# Patient Record
Sex: Female | Born: 1950
Health system: Southern US, Community
[De-identification: ages and names within clinical notes are randomized; demographics above are authoritative.]

## PROBLEM LIST (undated history)

## (undated) DIAGNOSIS — Z780 Asymptomatic menopausal state: Secondary | ICD-10-CM

## (undated) DIAGNOSIS — E785 Hyperlipidemia, unspecified: Secondary | ICD-10-CM

## (undated) DIAGNOSIS — J45909 Unspecified asthma, uncomplicated: Secondary | ICD-10-CM

## (undated) DIAGNOSIS — I10 Essential (primary) hypertension: Secondary | ICD-10-CM

## (undated) DIAGNOSIS — C50919 Malignant neoplasm of unspecified site of unspecified female breast: Secondary | ICD-10-CM

## (undated) DIAGNOSIS — G43109 Migraine with aura, not intractable, without status migrainosus: Secondary | ICD-10-CM

## (undated) DIAGNOSIS — K562 Volvulus: Secondary | ICD-10-CM

## (undated) DIAGNOSIS — A609 Anogenital herpesviral infection, unspecified: Secondary | ICD-10-CM

## (undated) HISTORY — DX: Malignant neoplasm of unspecified site of unspecified female breast: C50.919

## (undated) HISTORY — DX: Asymptomatic menopausal state: Z78.0

## (undated) HISTORY — DX: Hyperlipidemia, unspecified: E78.5

## (undated) HISTORY — DX: Anogenital herpesviral infection, unspecified: A60.9

## (undated) HISTORY — DX: Volvulus: K56.2

## (undated) HISTORY — DX: Essential (primary) hypertension: I10

## (undated) HISTORY — DX: Unspecified asthma, uncomplicated: J45.909

## (undated) HISTORY — PX: OVARIAN CYST REMOVAL: SHX89

## (undated) HISTORY — PX: OTHER SURGICAL HISTORY: SHX169

## (undated) HISTORY — PX: ESOPHAGEAL DILATION: SHX303

## (undated) HISTORY — PX: HERNIA REPAIR: SHX51

## (undated) HISTORY — DX: Migraine with aura, not intractable, without status migrainosus: G43.109

## (undated) HISTORY — PX: TYMPANOSTOMY TUBE PLACEMENT: SHX32

---

## 2001-05-19 HISTORY — PX: COLON SURGERY: SHX602

## 2005-02-17 ENCOUNTER — Encounter: Payer: Self-pay | Admitting: Family Medicine

## 2005-05-21 ENCOUNTER — Encounter: Payer: Self-pay | Admitting: Family Medicine

## 2005-05-22 ENCOUNTER — Ambulatory Visit: Payer: Self-pay | Admitting: Family Medicine

## 2005-06-09 ENCOUNTER — Ambulatory Visit: Payer: Self-pay | Admitting: Family Medicine

## 2005-07-04 ENCOUNTER — Ambulatory Visit: Payer: Self-pay | Admitting: Family Medicine

## 2005-08-19 ENCOUNTER — Ambulatory Visit: Payer: Self-pay | Admitting: Family Medicine

## 2005-12-23 ENCOUNTER — Ambulatory Visit: Payer: Self-pay | Admitting: Family Medicine

## 2005-12-26 ENCOUNTER — Ambulatory Visit: Payer: Self-pay | Admitting: Family Medicine

## 2006-02-24 DIAGNOSIS — J4540 Moderate persistent asthma, uncomplicated: Secondary | ICD-10-CM | POA: Insufficient documentation

## 2006-02-24 DIAGNOSIS — N951 Menopausal and female climacteric states: Secondary | ICD-10-CM

## 2006-02-24 DIAGNOSIS — G43909 Migraine, unspecified, not intractable, without status migrainosus: Secondary | ICD-10-CM | POA: Insufficient documentation

## 2006-02-24 DIAGNOSIS — F339 Major depressive disorder, recurrent, unspecified: Secondary | ICD-10-CM

## 2006-02-26 ENCOUNTER — Observation Stay (HOSPITAL_COMMUNITY): Admission: EM | Admit: 2006-02-26 | Discharge: 2006-02-27 | Payer: Self-pay | Admitting: Emergency Medicine

## 2006-02-26 ENCOUNTER — Ambulatory Visit: Payer: Self-pay | Admitting: Family Medicine

## 2006-02-26 ENCOUNTER — Ambulatory Visit: Payer: Self-pay | Admitting: Internal Medicine

## 2006-03-10 ENCOUNTER — Ambulatory Visit: Payer: Self-pay | Admitting: Cardiology

## 2006-03-10 ENCOUNTER — Ambulatory Visit: Payer: Self-pay

## 2006-03-10 ENCOUNTER — Encounter: Payer: Self-pay | Admitting: Cardiology

## 2006-03-18 ENCOUNTER — Ambulatory Visit: Payer: Self-pay | Admitting: Family Medicine

## 2006-03-25 ENCOUNTER — Ambulatory Visit: Payer: Self-pay | Admitting: Cardiology

## 2006-04-21 ENCOUNTER — Encounter: Payer: Self-pay | Admitting: Family Medicine

## 2006-06-16 ENCOUNTER — Telehealth: Payer: Self-pay | Admitting: Family Medicine

## 2006-06-16 DIAGNOSIS — B009 Herpesviral infection, unspecified: Secondary | ICD-10-CM | POA: Insufficient documentation

## 2006-08-13 ENCOUNTER — Encounter: Payer: Self-pay | Admitting: Family Medicine

## 2006-10-20 ENCOUNTER — Ambulatory Visit: Payer: Self-pay | Admitting: Family Medicine

## 2006-10-20 ENCOUNTER — Encounter: Payer: Self-pay | Admitting: Family Medicine

## 2006-10-20 ENCOUNTER — Other Ambulatory Visit: Admission: RE | Admit: 2006-10-20 | Discharge: 2006-10-20 | Payer: Self-pay | Admitting: Family Medicine

## 2006-10-20 DIAGNOSIS — E785 Hyperlipidemia, unspecified: Secondary | ICD-10-CM

## 2006-10-22 ENCOUNTER — Encounter: Payer: Self-pay | Admitting: Family Medicine

## 2006-10-22 LAB — CONVERTED CEMR LAB
ALT: 17 U/L
AST: 22 U/L
Albumin: 4.2 g/dL
Alkaline Phosphatase: 93 U/L
BUN: 12 mg/dL
CO2: 27 meq/L
Calcium: 9.2 mg/dL
Chloride: 101 meq/L
Cholesterol: 252 mg/dL — ABNORMAL HIGH
Creatinine, Ser: 0.74 mg/dL
Glucose, Bld: 83 mg/dL
HDL: 107 mg/dL
LDL Cholesterol: 134 mg/dL — ABNORMAL HIGH
Potassium: 4.9 meq/L
Sodium: 139 meq/L
TSH: 2.19 u[IU]/mL
Total Bilirubin: 0.4 mg/dL
Total CHOL/HDL Ratio: 2.4
Total Protein: 6.3 g/dL
Triglycerides: 56 mg/dL
VLDL: 11 mg/dL

## 2006-10-26 ENCOUNTER — Encounter: Payer: Self-pay | Admitting: Family Medicine

## 2006-10-26 ENCOUNTER — Telehealth (INDEPENDENT_AMBULATORY_CARE_PROVIDER_SITE_OTHER): Payer: Self-pay | Admitting: *Deleted

## 2006-12-01 ENCOUNTER — Encounter: Payer: Self-pay | Admitting: Family Medicine

## 2006-12-02 ENCOUNTER — Encounter: Payer: Self-pay | Admitting: Family Medicine

## 2006-12-02 DIAGNOSIS — M949 Disorder of cartilage, unspecified: Secondary | ICD-10-CM

## 2006-12-02 DIAGNOSIS — M899 Disorder of bone, unspecified: Secondary | ICD-10-CM

## 2006-12-03 ENCOUNTER — Encounter: Payer: Self-pay | Admitting: Family Medicine

## 2007-03-22 ENCOUNTER — Ambulatory Visit: Payer: Self-pay | Admitting: Family Medicine

## 2007-04-09 ENCOUNTER — Ambulatory Visit: Payer: Self-pay | Admitting: Family Medicine

## 2007-05-19 ENCOUNTER — Ambulatory Visit: Payer: Self-pay | Admitting: Family Medicine

## 2007-07-14 ENCOUNTER — Ambulatory Visit: Payer: Self-pay | Admitting: Family Medicine

## 2007-12-06 ENCOUNTER — Ambulatory Visit: Payer: Self-pay | Admitting: Family Medicine

## 2007-12-06 ENCOUNTER — Encounter: Payer: Self-pay | Admitting: Family Medicine

## 2007-12-06 ENCOUNTER — Encounter: Admission: RE | Admit: 2007-12-06 | Discharge: 2007-12-06 | Payer: Self-pay | Admitting: Family Medicine

## 2007-12-06 ENCOUNTER — Other Ambulatory Visit: Admission: RE | Admit: 2007-12-06 | Discharge: 2007-12-06 | Payer: Self-pay | Admitting: Family Medicine

## 2007-12-06 LAB — CONVERTED CEMR LAB
AST: 21 units/L (ref 0–37)
Alkaline Phosphatase: 92 units/L (ref 39–117)
BUN: 13 mg/dL (ref 6–23)
Calcium: 8.9 mg/dL (ref 8.4–10.5)
Chloride: 98 meq/L (ref 96–112)
Creatinine, Ser: 0.69 mg/dL (ref 0.40–1.20)
HDL: 108 mg/dL (ref >39)
Total Bilirubin: 0.4 mg/dL (ref 0.3–1.2)
Total CHOL/HDL Ratio: 2.3
VLDL: 11 mg/dL (ref 0–40)

## 2007-12-07 ENCOUNTER — Encounter: Payer: Self-pay | Admitting: Family Medicine

## 2008-02-17 ENCOUNTER — Ambulatory Visit: Payer: Self-pay | Admitting: Family Medicine

## 2008-03-16 ENCOUNTER — Ambulatory Visit: Payer: Self-pay | Admitting: Family Medicine

## 2008-03-20 ENCOUNTER — Telehealth: Payer: Self-pay | Admitting: Family Medicine

## 2008-03-20 ENCOUNTER — Encounter: Admission: RE | Admit: 2008-03-20 | Discharge: 2008-03-20 | Payer: Self-pay | Admitting: Family Medicine

## 2008-03-20 ENCOUNTER — Encounter: Payer: Self-pay | Admitting: Family Medicine

## 2008-03-22 ENCOUNTER — Telehealth: Payer: Self-pay | Admitting: Family Medicine

## 2008-09-01 ENCOUNTER — Telehealth: Payer: Self-pay | Admitting: Family Medicine

## 2008-12-19 ENCOUNTER — Telehealth (INDEPENDENT_AMBULATORY_CARE_PROVIDER_SITE_OTHER): Payer: Self-pay | Admitting: *Deleted

## 2009-01-10 ENCOUNTER — Telehealth (INDEPENDENT_AMBULATORY_CARE_PROVIDER_SITE_OTHER): Payer: Self-pay | Admitting: *Deleted

## 2009-02-19 ENCOUNTER — Ambulatory Visit: Payer: Self-pay | Admitting: Family Medicine

## 2009-02-21 ENCOUNTER — Telehealth (INDEPENDENT_AMBULATORY_CARE_PROVIDER_SITE_OTHER): Payer: Self-pay | Admitting: *Deleted

## 2009-03-19 ENCOUNTER — Telehealth (INDEPENDENT_AMBULATORY_CARE_PROVIDER_SITE_OTHER): Payer: Self-pay | Admitting: *Deleted

## 2009-07-06 ENCOUNTER — Telehealth (INDEPENDENT_AMBULATORY_CARE_PROVIDER_SITE_OTHER): Payer: Self-pay | Admitting: *Deleted

## 2009-07-25 ENCOUNTER — Encounter: Payer: Self-pay | Admitting: Family Medicine

## 2009-08-09 ENCOUNTER — Telehealth: Payer: Self-pay | Admitting: Family Medicine

## 2009-08-29 ENCOUNTER — Other Ambulatory Visit: Admission: RE | Admit: 2009-08-29 | Discharge: 2009-08-29 | Payer: Self-pay | Admitting: Family Medicine

## 2009-08-29 ENCOUNTER — Ambulatory Visit: Payer: Self-pay | Admitting: Family Medicine

## 2009-08-30 LAB — CONVERTED CEMR LAB
Alkaline Phosphatase: 104 units/L (ref 39–117)
Glucose, Bld: 88 mg/dL (ref 70–99)
HDL: 116 mg/dL (ref >39)
LDL Cholesterol: 131 mg/dL — ABNORMAL HIGH (ref 0–99)
Sodium: 136 meq/L (ref 135–145)
Total Bilirubin: 0.5 mg/dL (ref 0.3–1.2)
Total Protein: 6.3 g/dL (ref 6.0–8.3)
Triglycerides: 55 mg/dL (ref <150)
VLDL: 11 mg/dL (ref 0–40)

## 2009-10-04 ENCOUNTER — Encounter: Payer: Self-pay | Admitting: Family Medicine

## 2010-02-05 ENCOUNTER — Telehealth: Payer: Self-pay | Admitting: Family Medicine

## 2010-02-16 HISTORY — PX: SMALL INTESTINE SURGERY: SHX150

## 2010-03-06 ENCOUNTER — Ambulatory Visit: Payer: Self-pay | Admitting: Family Medicine

## 2010-04-05 ENCOUNTER — Ambulatory Visit: Payer: Self-pay | Admitting: Family Medicine

## 2010-04-05 DIAGNOSIS — J019 Acute sinusitis, unspecified: Secondary | ICD-10-CM

## 2010-04-08 ENCOUNTER — Telehealth: Payer: Self-pay | Admitting: Family Medicine

## 2010-04-17 ENCOUNTER — Telehealth (INDEPENDENT_AMBULATORY_CARE_PROVIDER_SITE_OTHER): Payer: Self-pay | Admitting: *Deleted

## 2010-06-09 ENCOUNTER — Encounter: Payer: Self-pay | Admitting: Family Medicine

## 2010-06-18 NOTE — Progress Notes (Signed)
Summary: Asthma  Phone Note Call from [name]   Caller: [name] Summary of Call: Pt states she saw Dr. Judie Petit 2 weeks ago and is feeling better but is still having problems w/ asthma. Pt would like to know what you recommend. Initial call taken by: Payton Spark CMA,  April 17, 2010 1:11 PM  Follow-up for Phone Call        Is she still using her ADVAIR 2 x day, Singulair each night and her RESCUE inhaler as needed? Follow-up by: Seymour Bars DO,  April 17, 2010 1:13 PM  Additional Follow-up for Phone Call Additional follow up Details #1::        [facility] informing Pt of the above Additional Follow-up by: Payton Spark CMA,  April 17, 2010 1:26 PM

## 2010-06-18 NOTE — Assessment & Plan Note (Signed)
Summary: Flu shot - jr  Nurse Visit   Allergies: 1)  ! Codeine  Immunizations Administered:  Influenza Vaccine # 1:    Vaccine Type: Fluvax 3+    Site: left deltoid    Mfr: GlaxoSmithKline    Dose: 0.5 ml    Route: IM    Given by: Sue Lush McCrimmon CMA, (AAMA)    Exp. Date: 11/16/2010    Lot #: [id number]    VIS given: 12/11/09 version given March 06, 2010.  Flu Vaccine Consent Questions:    Do you have a history of severe allergic reactions to this vaccine? no    Any prior history of allergic reactions to egg and/or gelatin? no    Do you have a sensitivity to the preservative Thimersol? no    Do you have a past history of Guillan-Barre Syndrome? no    Do you currently have an acute febrile illness? no    Have you ever had a severe reaction to latex? no    Vaccine information given and explained to patient? no    Are you currently pregnant? no  Orders Added: 1)  Flu Vaccine 52yrs + [90658] 2)  Admin 1st Vaccine [09811]

## 2010-06-18 NOTE — Progress Notes (Signed)
Summary: Requests to restart Advair  Phone Note Call from Patient   Caller: Patient [phone]  [phone] Summary of Call: Pt states she would like to go back on her Advair bc Qvar is not working as well and she is having a hard time w/ her asthma. Please advise. Initial call taken by: Payton Spark CMA,  February 05, 2010 11:56 AM  Follow-up for Phone Call        Changed back to Advair.  Schedule OV in the next wk for f/u asthma and to get a flu shot. Follow-up by: Seymour Bars DO,  February 05, 2010 12:40 PM    New/Updated Medications: ADVAIR DISKUS 250-50 MCG/DOSE AEPB (FLUTICASONE-SALMETEROL) 1 puff BID Prescriptions: ADVAIR DISKUS 250-50 MCG/DOSE AEPB (FLUTICASONE-SALMETEROL) 1 puff BID  #1 diskus x 3   Entered and Authorized by:   Seymour Bars DO   Signed by:   Seymour Bars DO on 02/05/2010   Method used:   [facility] [phone][facility] (retail[facility]       [address]       [place], [place]  [address]       Ph: [phone] or [phone]       Fax: [phone]   RxID:   [id number]   Appended Document: Requests to restart Advair Pt aware of the above

## 2010-06-18 NOTE — Miscellaneous (Signed)
Summary: mammogram normal  Clinical Lists Changes  Observations: Added new observation of MAMMRECACT: Screening mammogram in 1 year.    (10/03/2009 13:06) Added new observation of MAMMOGRAM: [facility]   No significant changes compared to previous study.  Assessment: BIRADS 1.  (10/03/2009 13:06)      Mammogram  Procedure date:  10/03/2009  Findings:      [facility]: [facility] ([facility]   No significant changes compared to previous study.  Assessment: BIRADS 1.   Comments:      Screening mammogram in 1 year.      Mammogram  Procedure date:  10/03/2009  Findings:      Location: [facility] ([facility]   No significant changes compared to previous study.  Assessment: BIRADS 1.   Comments:      Screening mammogram in 1 year.     Appended Document: mammogram normal   Bone Density  Procedure date:  10/03/2009  Findings:      Breast Clinic.  T socre L spine -1.1 Femoral neck -0.9  Osteopenia.  Unchanged.  Comments:      Assessment:  Osteopenia.      Bone Density  Procedure date:  10/03/2009  Findings:      Breast Clinic.  T socre L spine -1.1 Femoral neck -0.9  Osteopenia.  Unchanged.  Comments:      Assessment:  Osteopenia.      Appended Document: mammogram normal Pls let pt know that her mammogram was NORMAL and her bone density is unchanged with osteopenia.  She is to stay on Calcium with D 1-2 x a day with meals.  Repeat DEXA in 2 yrs, mammogramin 56yr.  Seymour Bars, D.O.  Appended Document: mammogram normal Pt notified of results and MD instructions. KJ LPN

## 2010-06-18 NOTE — Progress Notes (Signed)
Summary: Congestion  Phone Note Call from Patient   Summary of Call: Good morning [name], pt called with complaint of  congestion, wanted appt for today, pls call her to advise [phone]. Thanks, Diane  Follow-up for Phone Call        [facility] for Pt to CB w/ more info.  Follow-up by: Payton Spark CMA,  July 06, 2009 9:34 AM  Additional Follow-up for Phone Call Additional follow up Details #1::        [facility] and offered Pt apt this AM since we had cancellations. Pt did not call back. Closing note until further notice  Additional Follow-up by: Payton Spark CMA,  July 06, 2009 11:41 AM

## 2010-06-18 NOTE — Assessment & Plan Note (Signed)
Summary: Sinusitis, Asthma   Vital Signs:  Patient profile:   60 year old female Height:      62 inches Weight:      139 pounds O2 Sat:      97 % on Room air Temp:     98.6 degrees F oral Pulse rate:   82 / minute BP sitting:   130 / 79  (left arm) Cuff size:   regular  Vitals Entered By: Kathlene November LPN (April 05, 2010 11:03 AM)  O2 Flow:  Room air  Serial Vital Signs/Assessments:                                PEF    PreRx  PostRx Time      O2 Sat  O2 Type     L/min  L/min  L/min   By 11:18 AM                      240    260    230     Kim Johnson LPN 40:10 AM                      250    280    240     Kathlene November LPN  Comments: 27:25 AM Pt in yellow zone By: Kathlene November LPN  36:64 AM Post neb tx- yellow zone By: Kathlene November LPN   CC: since last Friday head congestion, throat sore, sinus pressure, dry cough, hoarsness   Primary Care Provider:  Seymour Bars D.O.  CC:  since last Friday head congestion, throat sore, sinus pressure, dry cough, and hoarsness.  History of Present Illness: since last Friday head congestion, throat sore, sinus pressure, dry cough, hoarsness. Sneezing initally but that has resolved. Feels she is now worse.  No fever. TAking Multi-sxs cold robitussin.  No alleviating or exacerbating factors. Hx of asthma.  Sllight cough, but dry, Feels a little tight.  On Adviar but has not had to use her rescue inhaler.  No GI sxs.  Was on BActrim  in October for a UTI.    Current Medications (verified): 1)  Advair Diskus 250-50 Mcg/dose Aepb (Fluticasone-Salmeterol) .Marland Kitchen.. 1 Puff Bid 2)  Ventolin Hfa 108 (90 Base) Mcg/act Aers (Albuterol Sulfate) .... 2 Puffs Q 6 Hrs Prn 3)  Citalopram Hydrobromide 20 Mg Tabs (Citalopram Hydrobromide) .... Take 1 Tablet By Mouth Once A Day 4)  Diazepam 5 Mg Tabs (Diazepam) .... Prn 5)  Imitrex 100 Mg Tabs (Sumatriptan Succinate) .... As Needed Migraine 6)  Promethazine Hcl 25 Mg Tabs (Promethazine Hcl) .... One By Mouth  As Needed 7)  Singulair 10 Mg Tabs (Montelukast Sodium) .... Take 1 Tablet By Mouth Once A Day 8)  Triamterene-Hctz 75-50 Mg Tabs (Triamterene-Hctz) .... One By Mouth Twice Dai Ly 9)  Valtrex 1 Gm Tabs (Valacyclovir Hcl) .... 1/2 Tab By Mouth Daily 10)  Zovirax 5 % Oint (Acyclovir) .... Apply To Herpes Lesions 6 X A Day X 7 Days 11)  Nystatin-Triamcinolone 100000-0.1 Unit/gm-% Oint (Nystatin-Triamcinolone) .... Apply To Perineal Rash Two Times A Day  X 14 Days  Allergies (verified): 1)  ! Codeine  Comments:  Nurse/Medical Assistant: The patient's medications and allergies were reviewed with the patient and were updated in the Medication and Allergy Lists. Kathlene November LPN (April 05, 2010 11:04 AM)  Past History:  Past Medical History:  Last updated: 08/29/2009 fish oil capsules--gi upset  hx volvulus  optical migraines w/ nystagmus  Severe Meniere`s Disease  uses Minette device HSV postmenopausal since age 89  Past Surgical History: Last updated: 03/04/2006 colon resection- small bowel ischemia  hiatal hernia repair  LTCS  ovarian cyst removed  Touma T tube placement Right  Family History: Reviewed history from 08/29/2009 and no changes required. 2 sisters both w/ depression,  father died of Parkinsons Dz, HTN, depression, glaucoma mother alive, HTN  Social History: Reviewed history from 12/06/2007 and no changes required. Works in [employer].  Married to [place].  Has 81 yo son in college.  Nonsmoker.  3 glasses of wine per wk.  1 soda daily.  Wants to resume regular exercise.  Physical Exam  General:  Well-developed,well-nourished,in no acute distress; alert,appropriate and cooperative throughout examination Head:  Normocephalic and atraumatic without obvious abnormalities. No apparent alopecia or balding. Mild facial tenderness under each eye.  Eyes:  No corneal or conjunctival inflammation noted. EOMI. Perrla. Ears:  External ear exam shows no significant  lesions or deformities.  Otoscopic examination reveals clear canals, tympanic membranes are intact bilaterally without bulging, retraction, inflammation or discharge. Hearing is grossly normal bilaterally. Nose:  External nasal examination shows no deformity or inflammation. Mouth:  Oral mucosa and oropharynx without lesions or exudates.  Teeth in good repair. Neck:  No deformities, masses, or tenderness noted. Lungs:  normal respiratory effort.  Diffuse expiratory wheezes on post and anterior lung feilds. Post neb some improvement but still wheezing.  Heart:  Normal rate and regular rhythm. S1 and S2 normal without gallop, murmur, click, rub or other extra sounds. Skin:  no rashes.   Cervical Nodes:  No lymphadenopathy noted Psych:  Cognition and judgment appear intact. Alert and cooperative with normal attention span and concentration. No apparent delusions, illusions, hallucinations   Impression & Recommendations:  Problem # 1:  SINUSITIS - ACUTE-NOS (ICD-461.9)  Her updated medication list for this problem includes:    Amoxicillin 875 Mg Tabs (Amoxicillin) .Marland Kitchen... Take 1 tablet by mouth two times a day for 10 days  Instructed on treatment. Call if symptoms persist or worsen.   Problem # 2:  ASTHMA, UNSPECIFIED (ICD-493.90) Clearly her asthma is flaring. Discussed starting to use her rescue inhaler. Gave her some instructions. Also inc her Advair to two times a day (she had been usning it once daily to wean herself).  I let her know that instaed of once a day when she is ready to wean it is better to drop to the lower dose first or switch to just an inhaled corticosteroid in a step down fashion. I also gave her a peak flow meter adn set the scale for her zones for her. I also reviewed what she she do when she is in each zone.  F/U in 1-2 month with Dr. Leonard Schwartz. Call if she is getting wrose.  Her updated medication list for this problem includes:    Advair Diskus 250-50 Mcg/dose Aepb  (Fluticasone-salmeterol) .Marland Kitchen... 1 puff bid    Ventolin Hfa 108 (90 Base) Mcg/act Aers (Albuterol sulfate) .Marland Kitchen... 2 puffs q 6 hrs prn    Singulair 10 Mg Tabs (Montelukast sodium) .Marland Kitchen... Take 1 tablet by mouth once a day  Orders: Albuterol Sulfate Sol 1mg  unit dose (J1914) Nebulizer Tx (78295) Peak Flow Rate (94150) Peak Flow Meter (A2130)  Complete Medication List: 1)  Advair Diskus 250-50 Mcg/dose Aepb (Fluticasone-salmeterol) .Marland Kitchen.. 1 puff bid 2)  Ventolin Hfa 108 (90  Base) Mcg/act Aers (Albuterol sulfate) .... 2 puffs q 6 hrs prn 3)  Citalopram Hydrobromide 20 Mg Tabs (Citalopram hydrobromide) .... Take 1 tablet by mouth once a day 4)  Diazepam 5 Mg Tabs (Diazepam) .... Prn 5)  Imitrex 100 Mg Tabs (Sumatriptan succinate) .... As needed migraine 6)  Promethazine Hcl 25 Mg Tabs (Promethazine hcl) .... One by mouth as needed 7)  Singulair 10 Mg Tabs (Montelukast sodium) .... Take 1 tablet by mouth once a day 8)  Triamterene-hctz 75-50 Mg Tabs (Triamterene-hctz) .... One by mouth twice dai ly 9)  Valtrex 1 Gm Tabs (Valacyclovir hcl) .... 1/2 tab by mouth daily 10)  Zovirax 5 % Oint (Acyclovir) .... Apply to herpes lesions 6 x a day x 7 days 11)  Nystatin-triamcinolone 100000-0.1 Unit/gm-% Oint (Nystatin-triamcinolone) .... Apply to perineal rash two times a day  x 14 days 12)  Amoxicillin 875 Mg Tabs (Amoxicillin) .... Take 1 tablet by mouth two times a day for 10 days  Patient Instructions: 1)  Start using your ventolin 4 puffs 4 x a day for a couple of days, then 3 x a day for couple of days, then 2 x a day for a couple of days, then once a day for a couple of days.  2)  If you feel you are getting worse then let us know.   3)  Complete all of your antibiotic.   4)  Increase your Advair to two times a day  5)  Follow up wiht Dr. Cathey Endow for you asthma in 1-2 months.  Prescriptions: AMOXICILLIN 875 MG TABS (AMOXICILLIN) Take 1 tablet by mouth two times a day for 10 days  #20 x 0   Entered and  Authorized by:   Nani Gasser MD   Signed by:   Nani Gasser MD on 04/05/2010   Method used:   [facility]  [address] [phone]* (retail)       [address]       [place], [place]  [address]       Ph: [phone] or [phone]       Fax: [phone]   RxID:   [id number]    Medication Administration  Medication # 1:    Medication: Albuterol Sulfate Sol 1mg  unit dose    Diagnosis: ASTHMA, UNSPECIFIED (ICD-493.90)    Dose: 2.5mg     Route: inhaled    Exp Date: 04/19/2011    Lot #: [id number]    Mfr: Nephron    Patient tolerated medication without complications    Given by: Kathlene November LPN (April 05, 2010 11:19 AM)  Orders Added: 1)  Albuterol Sulfate Sol 1mg  unit dose [J7613] 2)  Nebulizer Tx [94640] 3)  Est. Patient Level IV [34742] 4)  Peak Flow Rate [94150] 5)  Peak Flow Meter [V9563]

## 2010-06-18 NOTE — Letter (Signed)
Summary: [facility]  [facility]   Imported By: Lanelle Bal 08/01/2009 11:22:04  _____________________________________________________________________  External Attachment:    Type:   Image     Comment:   External Document

## 2010-06-18 NOTE — Progress Notes (Signed)
Summary: Antibiotic making her sick  Phone Note Call from Patient Call back at [facility] Phone [phone]   Caller: Patient Call For: [place] Summary of Call: Seen Friday and given Amox 875mg  twice a day. Pt states this is making her really nauseated. Can she get something different sent to her pharmacy. Please advise Initial call taken by: Kathlene November LPN,  April 08, 2010 2:26 PM  Follow-up for Phone Call        [facility] will send over new ABX.  Follow-up by: Nani Gasser MD,  April 08, 2010 2:51 PM  Additional Follow-up for Phone Call Additional follow up Details #1::        left message Additional Follow-up by: Avon Gully CMA, Duncan Dull),  April 08, 2010 3:58 PM    New/Updated Medications: ZITHROMAX Z-PAK 250 MG TABS (AZITHROMYCIN) Take as directed. Prescriptions: ZITHROMAX Z-PAK 250 MG TABS (AZITHROMYCIN) Take as directed.  #1 pack x 0   Entered and Authorized by:   Nani Gasser MD   Signed by:   Nani Gasser MD on 04/08/2010   Method used:   [facility] [phone]* (retail)       [address]       [place], [place]  [address]       Ph: [phone] or [phone]       Fax: [phone]   RxID:   [id number]

## 2010-06-18 NOTE — Assessment & Plan Note (Signed)
Summary: CPE with pap   Vital Signs:  Patient profile:   60 year old female Height:      62 inches Weight:      133 pounds BMI:     24.41 O2 Sat:      98 % on Room air Pulse rate:   73 / minute BP sitting:   124 / 80  (left arm) Cuff size:   regular  Vitals Entered By: Payton Spark CMA (August 29, 2009 8:11 AM)  O2 Flow:  Room air CC: CPE w/ pap   Primary Care Provider:  Seymour Bars D.O.  CC:  CPE w/ pap.  History of Present Illness: 60 yo WF presents for CPE with pap smear.  She is doing well after being admitted to the hosp in March for a partial SBO.  Her bowels are back to normal.  She is due for fasting labs, mammogram and DEXA.  She has a dx of asthma but never had a pneumovax.  Her tetanus is UTD.  Denies fam hx of premature heart dz.  She denies any postmenopausal bleeding.  She is exercising and eating fairly healthy.  She has a chronic perineal rash.    Current Medications (verified): 1)  Advair Diskus 250-50 Mcg/dose Misc (Fluticasone-Salmeterol) .... One Inhalation Twice Daily 2)  Ventolin Hfa 108 (90 Base) Mcg/act Aers (Albuterol Sulfate) .... 2 Puffs Q 6 Hrs Prn 3)  Citalopram Hydrobromide 20 Mg Tabs (Citalopram Hydrobromide) .... Take 1 Tablet By Mouth Once A Day 4)  Diazepam 5 Mg Tabs (Diazepam) .... Prn 5)  Imitrex 100 Mg Tabs (Sumatriptan Succinate) .... As Needed Migraine 6)  Promethazine Hcl 25 Mg Tabs (Promethazine Hcl) .... One By Mouth As Needed 7)  Singulair 10 Mg Tabs (Montelukast Sodium) .... Take 1 Tablet By Mouth Once A Day 8)  Triamterene-Hctz 75-50 Mg Tabs (Triamterene-Hctz) .... One By Mouth Twice Dai Ly 9)  Zofran Odt 8 Mg Tbdp (Ondansetron) .... As Needed Can Use Instead of Promethazibne 10)  Valtrex 1 Gm Tabs (Valacyclovir Hcl) .... 1/2 Tab By Mouth Daily  Allergies (verified): 1)  ! Codeine  Past History:  Past Medical History: fish oil capsules--gi upset  hx volvulus  optical migraines w/ nystagmus  Severe Meniere`s Disease  uses  Minette device HSV postmenopausal since age 31  Past Surgical History: Reviewed history from 03/04/2006 and no changes required. colon resection- small bowel ischemia  hiatal hernia repair  LTCS  ovarian cyst removed  Touma T tube placement Right  Family History: Reviewed history from 03/04/2006 and no changes required. 2 sisters both w/ depression,  father died of Parkinsons Dz, HTN, depression, glaucoma mother alive, HTN  Social History: Reviewed history from 12/06/2007 and no changes required. Works in [employer].  Married to [place].  Has 42 yo son in college.  Nonsmoker.  3 glasses of wine per wk.  1 soda daily.  Wants to resume regular exercise.  Review of Systems  The patient denies anorexia, fever, weight loss, weight gain, vision loss, decreased hearing, hoarseness, chest pain, syncope, dyspnea on exertion, peripheral edema, prolonged cough, headaches, hemoptysis, abdominal pain, melena, hematochezia, severe indigestion/heartburn, hematuria, incontinence, genital sores, muscle weakness, suspicious skin lesions, transient blindness, difficulty walking, depression, unusual weight change, abnormal bleeding, enlarged lymph nodes, angioedema, breast masses, and testicular masses.    Physical Exam  General:  alert, well-developed, well-nourished, and well-hydrated.   Head:  normocephalic and atraumatic.   Eyes:  pupils equal, pupils round, and pupils reactive to light.  Ears:  no external deformities.   Nose:  no nasal discharge.   Mouth:  good dentition and pharynx pink and moist.   Neck:  no masses.   Breasts:  No mass, nodules, thickening, tenderness, bulging, retraction, inflamation, nipple discharge or skin changes noted.   Lungs:  Normal respiratory effort, chest expands symmetrically. Lungs are clear to auscultation, no crackles or wheezes. Heart:  Normal rate and regular rhythm. S1 and S2 normal without gallop, murmur, click, rub or other extra sounds. Abdomen:  Bowel  sounds positive,abdomen soft and non-tender without masses, organomegaly or hernias noted. Genitalia:  Pelvic Exam:        External: normal female genitalia without lesions or masses        Vagina: normal without lesions or masses        Cervix: normal without lesions or masses        Adnexa: normal bimanual exam without masses or fullness        Uterus: normal by palpation        Pap smear: performed Pulses:  2+ radial and pedal pulses Extremities:  no E/C/C Skin:  color normal.   Cervical Nodes:  No lymphadenopathy noted Psych:  good eye contact, not anxious appearing, and not depressed appearing.     Impression & Recommendations:  Problem # 1:  Gynecological examination-routine (ICD-V72.31) Keeping healthy checklist for women reviewed. BP at goal.  BMI at goal. Update fasting labs, mammogram and DEXA. PNX today for hx of asthma. Tetanus and colonoscopy are UTD. MVI along with calcium with D recommended. Healthy diet and regular exercise recommended.    Complete Medication List: 1)  Qvar 80 Mcg/act Aers (Beclomethasone dipropionate) .Marland Kitchen.. 1 puff two times a day 2)  Ventolin Hfa 108 (90 Base) Mcg/act Aers (Albuterol sulfate) .... 2 puffs q 6 hrs prn 3)  Citalopram Hydrobromide 20 Mg Tabs (Citalopram hydrobromide) .... Take 1 tablet by mouth once a day 4)  Diazepam 5 Mg Tabs (Diazepam) .... Prn 5)  Imitrex 100 Mg Tabs (Sumatriptan succinate) .... As needed migraine 6)  Promethazine Hcl 25 Mg Tabs (Promethazine hcl) .... One by mouth as needed 7)  Singulair 10 Mg Tabs (Montelukast sodium) .... Take 1 tablet by mouth once a day 8)  Triamterene-hctz 75-50 Mg Tabs (Triamterene-hctz) .... One by mouth twice dai ly 9)  Zofran Odt 8 Mg Tbdp (Ondansetron) .... As needed can use instead of promethazibne 10)  Valtrex 1 Gm Tabs (Valacyclovir hcl) .... 1/2 tab by mouth daily 11)  Zovirax 5 % Oint (Acyclovir) .... Apply to herpes lesions 6 x a day x 7 days 12)  Nystatin-triamcinolone  100000-0.1 Unit/gm-% Oint (Nystatin-triamcinolone) .... Apply to perineal rash two times a day  x 14 days  Other Orders: T-Comprehensive Metabolic Panel [id number]) T-Lipid Profile [id number]) T-Mammography Bilateral Screening (29528) T-DXA Bone Density/ Appendicular (41324) T-Dual DXA Bone Density/ Axial (40102) Pneumococcal Vaccine (72536) Admin 1st Vaccine (64403) Admin 1st Vaccine [facility]) [phone])  Patient Instructions: 1)  Use Zovirax ointment for herpes outbreak and Nystatin/ Triamcinolone for perineal rash.  After 14 days, switch to Aquaphor 2 x a day.  Repeat if rash recurs after 1 month. 2)  Change Advair to Qvar 2 x a day for step down therapy for asthma.  Rinse mouth out after each use. 3)  Update fasting labs today. 4)  Will call you with pap and lab results by Friday. 5)  Update mammogram and DEXA. 6)  Pneumovax given today. 7)  Return for f/u asthma  in 3 mos. Prescriptions: NYSTATIN-TRIAMCINOLONE 100000-0.1 UNIT/GM-% OINT (NYSTATIN-TRIAMCINOLONE) apply to perineal rash two times a day  x 14 days  #15g x 1   Entered and Authorized by:   Seymour Bars DO   Signed by:   Seymour Bars DO on 08/29/2009   Method used:   [facility] [phone]* (retail)       [address]       [place], [place]  [address]       Ph: [phone] or [phone]       Fax: [phone]   RxID:   [id number] ZOVIRAX 5 % OINT (ACYCLOVIR) apply to herpes lesions 6 x a day x 7 days  #15g x 1   Entered and Authorized by:   Seymour Bars DO   Signed by:   Seymour Bars DO on 08/29/2009   Method used:   [facility] [phone]* (retail)       [address]       [place], [place]  [address]       Ph: [phone] or [phone]       Fax: [phone]   RxID:   [id number] QVAR 80 MCG/ACT AERS (BECLOMETHASONE DIPROPIONATE) 1 puff two times a day  #3 inhalers x 1   Entered and Authorized by:   Seymour Bars DO   Signed by:   Seymour Bars DO on  08/29/2009   Method used:   Electronically to        [facility]* (mail-order)             ,          Ph: [phone]       Fax: [phone]   RxID:   [id number]    Orders Added: 1)  T-Comprehensive Metabolic Panel [[id number]] 2)  T-Lipid Profile [69678-93810] 3)  T-Mammography Bilateral Screening [77057] 4)  T-DXA Bone Density/ Appendicular [77081] 5)  T-Dual DXA Bone Density/ Axial [77080] 6)  Pneumococcal Vaccine [90732] 7)  Admin 1st Vaccine [90471] 8)  Admin 1st Vaccine [facility]) [17510C] 9)  Est. Patient age 67-64 [27]    Pneumovax Vaccine    Vaccine Type: Pneumovax    Dose: 0.5 ml    Route: IM    Given by: Payton Spark CMA    Exp. Date: 06/19/2010    Lot #: [id number]    VIS given: 12/15/95 version given August 29, 2009.

## 2010-06-18 NOTE — Progress Notes (Signed)
Summary: Rx  Phone Note Call from Patient Call back at Home Phone [phone]   Caller: Patient Call For: Seymour Bars DO Reason for Call: Refill Medication Summary of Call: Has 18-20 pills left- Citalopram-please send to [facility]-pt has scheduled a physical in April Initial call taken by: Lannette Donath,  August 09, 2009 9:38 AM    Prescriptions: CITALOPRAM HYDROBROMIDE 20 MG TABS (CITALOPRAM HYDROBROMIDE) Take 1 tablet by mouth once a day  #90 x 2   Entered by:   Kathlene November   Authorized by:   Nani Gasser MD   Signed by:   Kathlene November on 08/09/2009   Method used:   Electronically to        [facility] (mail-order)             ,          Ph: [phone]       Fax: [phone]   RxID:   [id number]

## 2010-09-04 ENCOUNTER — Other Ambulatory Visit: Payer: Self-pay | Admitting: Family Medicine

## 2010-09-05 ENCOUNTER — Other Ambulatory Visit: Payer: Self-pay | Admitting: Family Medicine

## 2010-09-05 ENCOUNTER — Other Ambulatory Visit: Payer: Self-pay | Admitting: Sports Medicine

## 2010-09-05 ENCOUNTER — Ambulatory Visit
Admission: RE | Admit: 2010-09-05 | Discharge: 2010-09-05 | Disposition: A | Discharge status: Home / Self Care | Payer: Self-pay | Source: Ambulatory Visit | Attending: Sports Medicine | Admitting: Sports Medicine

## 2010-09-05 DIAGNOSIS — M545 Low back pain: Secondary | ICD-10-CM

## 2010-09-10 ENCOUNTER — Ambulatory Visit: Payer: 59 | Admitting: Physical Therapy

## 2010-09-15 ENCOUNTER — Other Ambulatory Visit: Payer: Self-pay | Admitting: Family Medicine

## 2010-10-04 NOTE — H&P (Signed)
[name], Mathews         ACCOUNT NO.:  [id number]   MEDICAL RECORD NO.:  [id number]          PATIENT TYPE:  EMS   LOCATION:  MAJO                         FACILITY:  [facility]   PHYSICIAN:  Bevelyn Buckles. Bensimhon, MDDATE OF BIRTH:  11-09-1950   DATE OF ADMISSION:  02/26/2006  DATE OF DISCHARGE:                                HISTORY & PHYSICAL   PRIMARY CARE PHYSICIAN:  Dr. Nani Mathews.  She is new to [facility]  cardiology.   REASON FOR ADMISSION:  Chest pain and palpitations.   HISTORY OF PRESENT ILLNESS:  [name] is a delightful 60 year old woman  with a history of asthma and depression.  She denies any history of known  cardiac disease.  She has never had a stress test or cardiac  catheterization.  She also has a history of severe Meniere's disease.  Apparently she had one of the tubes fall out of her right ear and she  underwent replacement of this morning.  This afternoon while she was at  work, she developed severe pounding in her chest and it felt irregular and  hard.  Also was associated with shortness of breath and chest pressure  radiating to her left shoulder.  This persisted for some time so she left  and went to see her primary care doctor.  While she was having the symptoms,  although a bit less, they obtained EKG which showed sinus rhythm with Mobitz  I second degree heart block consistent with Wenckebach.  The symptoms have  now abated.  She was told to come to the ER for further evaluation and  observation.   At baseline, she is fairly active though she is limited by her Meniere's  disease.  She walks about half an hour a day without any chest pain or other  discomfort.  She is very careful about her caffeine intake and only drinks  one Pepsi a day.  She has never had syncope or presyncope.   REVIEW OF SYSTEMS:  Negative for fevers, chills, no nausea, vomiting,  abdominal pain.  Denies any melena or bright red blood per rectum.  There is  no heart  failure symptoms or claudication.  It is notable for asthma,  depression and menopausal symptoms.  Remainder review of systems is negative  except for as described in the HPI and problem list.   PROBLEMS:  1. Asthma.  2. Major depression, recurrent.  3. Perimenopausal.  4. Migraine.  5. Severe Meniere's disease.  6. History of volvulus and small bowel obstruction requiring small-bowel      resection several years ago.   CURRENT MEDICATIONS:  1. Advair Diskus.  2. Albuterol p.r.n.  3. Citalopram 20 mg a day.  4. Diazepam 5 mg p.r.n.  5. Flunisolide.  6. Imitrex p.r.n.  7. Promethazine p.r.n.  8. Singulair 10 mg a day.  9. Triamterene/hydrochlorothiazide 70/75/50.  10.Valtrex as a suppressive medication.  11.Verapamil 240 a day for optical migraines.  12.Zofran p.r.n.Marland Kitchen   ALLERGIES:  CODEINE.   SOCIAL HISTORY:  She lives in [place] with her husband.  She sells real  estate.  Denies any tobacco use.  She drinks  three glasses of wine per week.   FAMILY HISTORY:  There is father died of Parkinson's disease, also a history  of hypertension, depression.  Mother is alive with hypertension.  Two  sisters both of depression.  No history of premature coronary artery disease  or sudden cardiac death.   PHYSICAL EXAM:  She is well-appearing in no acute distress.  Respirations  are unlabored.  Temperature is 97.7, blood pressure is 124/63, heart rate  64.  Saturating 100% on room air.  HEENT: Sclerae anicteric.  EOMI.  There are no xanthelasmas.  Mucous  membranes are moist.  NECK:  Supple.  There is no JVD.  Carotid 2+ bilaterally without any bruits.  There is no lymphadenopathy or thyromegaly.  CARDIAC:  She is regular rate and rhythm.  No murmurs, rubs or gallops.  LUNGS:  Clear.  ABDOMEN:  Soft, nontender, nondistended, no hepatosplenomegaly, no bruits.  No masses.  Good bowel sounds.  EXTREMITIES:  Warm with no cyanosis, clubbing or edema.  Good distal pulses.  SKIN:   There are no rashes or arthropathies.  NEURO:  She is alert and oriented x3 with a very pleasant affect.  Cranial  nerves II-XII are intact.  Moves all four extremities without difficulty.   EKG from earlier today shows sinus rhythm with type 1 second degree AV block  consistent with Wenckebach.  Follow-up EKG in the ER shows normal sinus  rhythm with a first degree AV block.  Rate of 60.  There is left atrial  enlargement.  There is no evidence of QT prolongation or preexcitement.  She  does have RSR prime in V1 indicative of right-sided conduction delay.   LABS:  Pending.   ASSESSMENT:  1. Palpitations with associated chest pain and dyspnea.  2. Wenckebach phenomenon.  3. Asthma.   PLAN/DISCUSSION:  We will admit her for observation.  It seems like she had  symptomatic Wenckebach phenomenon.  However, given the intensity or  symptoms, I wonder she perhaps had an SVT preceding the Wenckebach.  We will  observe her on the monitor.  We will check electrolytes.  We will rule her  out for myocardial infarction as well as check a D-dimer and TSH.  Should  her D-dimer be positive, I would proceed with CT scan of  the chest to rule out PE.  If she remains asymptomatic with no events on her  monitor and she rules out for MI, she likely can be discharged home in the  morning with plans for an outpatient echocardiogram and stress test as well  as event monitor.      Bevelyn Buckles. Bensimhon, MD  Electronically Signed     DRB/MEDQ  D:  02/26/2006  T:  02/28/2006  Job:  [id number]   cc:   [name], M.D.

## 2010-10-04 NOTE — Assessment & Plan Note (Signed)
[facility]                              CARDIOLOGY OFFICE NOTE   NAME:HONEYCUTTRebeka, Kimble                MRN:          [id number]  DATE:03/25/2006                            DOB:          10/22/1950    [name] is a very pleasant 60 year old female who is recently  admitted to [facility] by Dr. Gala Romney.  At the time of the  admission on February 26, 2006, she was complaining of palpitations.  It was  described as a hard heart beat with occasional stops.  She was found to  be in second degree heart block type 1.  She ruled out for myocardial  infarction.  After discharge, she was scheduled to have an echocardiogram  which was performed on March 10, 2006.  Her LV function was normal.  There  was no significant valvular abnormality noted.  She also had a stress  Myoview performed on March 10, 2006.  She exercised for nine minutes and  stopped due to fatigue.  Her heart rate increased to a maximum of 148 which  was 90% of her predicted maximum heart rate.  Her ejection fraction was 72%  and there was no ischemia or infarction.   She did have a CardioNet monitor as well.  This did demonstrate occasional  Wenckebach.  We therefore discontinued her verapamil which she was taking  for ocular migraines.  Also of note, she did have laboratories in the  hospital that showed a TSH that was normal.  Her LDL was 139 with a total  cholesterol of 260.  Her HDL, however, was 108.  Since discontinuing her  verapamil, she states that she has felt better and she has had no further  palpitations.  Her medications include Advair 250/50 one puff b.i.d.,  Albuterol as needed, potassium 20 mEq p.o. daily, Singular, nortriptyline,  triamterine/hydrochlorothiazide 75/25 mg p.o. daily and Celexa.   Her physical exam today shows a blood pressure of 108/70 and her pulse is  71.  She weighs 123 pounds.  Her neck is supple and I cannot appreciate  bruits.   Her chest is clear.  Cardiovascular exam reveals a regular rate and  rhythm.  Normal S1-S2.  I cannot appreciate murmurs, gallops, or rubs.  Her  extremities show no edema.   Follow-up electrocardiogram today shows a sinus rhythm at a rate of 57.  The  axis is normal.  There is no RV conduction delay but there are no other ST  changes noted.   DIAGNOSES:  1. Recent palpitations felt secondary to Wenckebach.  2. History of asthma.  3. History of depression.  4. History of migraines.  5. History of Meniere's disease.   PLAN:  [name] returns for follow-up.  It sounds that her  palpitations were most likely related to her Wenckebach as this was  demonstrated on her event monitor.  There was occasional Wenckebach with  junctional escape beats as well as PVCs.  We will therefore continue off of  her verapamil.  Her symptoms appear to be improving since then.  Note, she  has never had a syncopal episode  and also has not had presyncope.  She is  now taking nortriptyline for her history of migraine headaches.  I have  instructed her to contact me if she is ever placed on any AV nodal blocking  agents.  We will see her back in approximately six months to review  her symptoms.  Note, her stress test showed normal perfusion, normal LV  function and she had chronotropic competence.    ______________________________  Madolyn Frieze. Jens Som, MD, [facility]    BSC/MedQ  DD: 03/25/2006  DT: 03/25/2006  Job #: [id number]   cc:   Nani Gasser, M.D.

## 2010-10-04 NOTE — Discharge Summary (Signed)
[name], [name]         ACCOUNT NO.:  [id number]   MEDICAL RECORD NO.:  [id number]          PATIENT TYPE:  INP   LOCATION:  4711                         FACILITY:  [facility]   PHYSICIAN:  Nicolasa Ducking, ANP DATE OF BIRTH:  03/05/1951   DATE OF ADMISSION:  02/26/2006  DATE OF DISCHARGE:  02/27/2006                                 DISCHARGE SUMMARY   PRIMARY CARDIOLOGIST:  Dr. Jens Som in [place].   PRIMARY CARDIOLOGIST:  Dr. Cathey Endow.   PRINCIPAL DIAGNOSIS:  Palpitations and 2nd degree type 1 heart block.   OTHER DIAGNOSES:  1. Meniere's disease.  2. Depression.  3. Peri menopausal.  4. History of migraine headaches.  5. Volvulus status post resection of 36 inches of small bowel.   ALLERGIES:  CODEINE.   PROCEDURE:  None.   HISTORY OF PRESENT ILLNESS:  60 year old white female with no prior history  of CAD.  On February 26, 2006 after lunch, she developed irregular hard heart  pounding subsequently associated with shortness of breath and left shoulder  heaviness and weakness.  She drove to her primary care physician's office  and during symptoms, an EKG was performed revealing Wenckebach.  The case  was discussed with Dr. Antoine Poche and the decision was made to observe her  overnight here at [facility].   HOSPITAL COURSE:  She has remained in sinus rhythm on the monitor without  any recurrence of Wenckebach.  Her cardiac markers are negative x2.  D-dimer  is also negative and TSH is normal.  She is hypokalemic with a potassium of  3.4.  She does take triamterene HCTZ at home.  She is being discharged home  this morning and we have arranged for her to have an outpatient exercise  Myoview, echocardiogram, as well as an event recorder.   DISCHARGE LABS:  Hemoglobin 12.9, hematocrit 38.0, wbc 8.5, platelets 362, d-  dimer less than 0.22, sodium 133, potassium 3.4, chloride 96, SGOT 26, BUN  13, creatinine 0.7, glucose 96, total bilirubin 0.4, alkaline phosphatase  91, AST 23, ALT 22, total protein 6.1, albumin 3.8, calcium 9.0, magnesium  2.0, CK 68, MB 1.1, troponin I 0.02, total cholesterol 260, triglycerides  63, HDL 108, LDL 139, TSH 2.305.   DISPOSITION:  Patient is being discharged home today in good condition.   FOLLOWUP:  She will be contacted to pick up an event recorder at [facility] in [place] early next week.  She is scheduled for an exercise  Myoview at [facility] in [place] on March 10, 2006 at 8:45 a.m.  She will also have a 2D echocardiogram performed that day at 1:00 p.m.  She  is to followup with Dr. Olga Millers on November 7th at 2:00 p.m. in our  [place] office.   DISCHARGE MEDICATIONS:  1. Advair 250/50 1 puff b.i.d.  2. Albuterol p.r.n.  3. Celexa 20 mg q. day.  4. Valium 5 mg p.r.n.  5. Flunisolide inhaled q. day.  6. Imitrex p.r.n.  7. Singulair 10 mg q. day.  8. Valtrex 500 mg q. day.  9. Triamterene HCTZ 75/50 mg q. day.  10.Verapamil 240 mg p.r.n.  migraines.  11.Zofran p.r.n.  12.Zocor 20 mg q.h.s.  13.K-Dur 20 mEq q. day.   OUTSTANDING LAB STUDIES:  None.   DURATION DISCHARGE ENCOUNTER:  35 minutes including physician time.           ______________________________  Nicolasa Ducking, ANP     CB/MEDQ  D:  02/27/2006  T:  02/27/2006  Job:  [id number]   cc:   Seymour Bars, D.O.

## 2010-10-16 ENCOUNTER — Telehealth: Payer: Self-pay | Admitting: Family Medicine

## 2010-10-16 DIAGNOSIS — F329 Major depressive disorder, single episode, unspecified: Secondary | ICD-10-CM

## 2010-10-16 MED ORDER — CITALOPRAM HYDROBROMIDE 20 MG PO TABS: 20.0000 mg | ORAL_TABLET | Freq: Every day | ORAL | Status: DC

## 2010-10-16 NOTE — Telephone Encounter (Signed)
Pt called and will be out of her citalopram.  She needs # 14 pills called to local til she can receive the mail order through the mail. Plan:  Citalopram 20 mg #14/0refills sent to her pharmacy locally til she can get mail order. Jarvis Newcomer, LPN [facility] Dimes

## 2011-01-01 ENCOUNTER — Other Ambulatory Visit: Payer: Self-pay | Admitting: Family Medicine

## 2011-01-01 NOTE — Telephone Encounter (Signed)
Pt called and LMOM for triage nurse that she needs a refill for her citalopram sent to [facility] order.  Plan:  Pt is overdue for depression appt, and she was notified that a 90 day cannot be sent at this time until she comes in for a fup appt.  Pt voiced understanding and wanted to go ahead and schedule with Dr. Linford Arnold for next week and appt sched.  Told pt we may not be able to keep her with Dr. Linford Arnold due to the overload of patients already, but she could discuss that with the provider.  Pt has enough med until she comes in next Tuesday and she will ask for a 90 day script at that time. Jarvis Newcomer, LPN [facility]

## 2011-01-02 ENCOUNTER — Encounter: Payer: Self-pay | Admitting: Family Medicine

## 2011-01-06 ENCOUNTER — Ambulatory Visit (INDEPENDENT_AMBULATORY_CARE_PROVIDER_SITE_OTHER): Payer: 59 | Admitting: Family Medicine

## 2011-01-06 DIAGNOSIS — IMO0001 Reserved for inherently not codable concepts without codable children: Secondary | ICD-10-CM

## 2011-01-06 DIAGNOSIS — R350 Frequency of micturition: Secondary | ICD-10-CM

## 2011-01-06 DIAGNOSIS — N390 Urinary tract infection, site not specified: Secondary | ICD-10-CM

## 2011-01-06 LAB — POCT URINALYSIS DIPSTICK
Bilirubin, UA: NEGATIVE
Glucose, UA: NEGATIVE
Spec Grav, UA: 1.015

## 2011-01-06 MED ORDER — SULFAMETHOXAZOLE-TRIMETHOPRIM 800-160 MG PO TABS: 1.0000 | ORAL_TABLET | Freq: Two times a day (BID) | ORAL | Status: AC

## 2011-01-06 NOTE — Progress Notes (Signed)
  Subjective:    Patient ID: [name], female    DOB: August 23, 1950, 60 y.o.   MRN: [id number]  HPI c/o urinary frequency x 3 days.     Review of Systems     Objective:   Physical Exam        Assessment & Plan:  UTI - UA is grossly + for infection. Will treat with antibiotics.  Call if symptoms have not cleared in 72 hrs.

## 2011-01-06 NOTE — Patient Instructions (Signed)
Take Septra DS 1 tab with breakfast and 1 tab with dinner for 3 days for UTI. Drink plenty of water. Avoid intercourse during treatment.  Call if symptoms have not resolved by the end of the week.

## 2011-01-07 ENCOUNTER — Ambulatory Visit (INDEPENDENT_AMBULATORY_CARE_PROVIDER_SITE_OTHER): Payer: 59 | Admitting: Family Medicine

## 2011-01-07 ENCOUNTER — Encounter: Payer: Self-pay | Admitting: Family Medicine

## 2011-01-07 DIAGNOSIS — Z0000 Encounter for general adult medical examination without abnormal findings: Secondary | ICD-10-CM

## 2011-01-07 DIAGNOSIS — R0789 Other chest pain: Secondary | ICD-10-CM

## 2011-01-07 DIAGNOSIS — F329 Major depressive disorder, single episode, unspecified: Secondary | ICD-10-CM

## 2011-01-07 DIAGNOSIS — R1310 Dysphagia, unspecified: Secondary | ICD-10-CM

## 2011-01-07 DIAGNOSIS — R5383 Other fatigue: Secondary | ICD-10-CM

## 2011-01-07 DIAGNOSIS — F339 Major depressive disorder, recurrent, unspecified: Secondary | ICD-10-CM

## 2011-01-07 MED ORDER — CITALOPRAM HYDROBROMIDE 20 MG PO TABS: 20.0000 mg | ORAL_TABLET | Freq: Every day | ORAL | Status: DC

## 2011-01-07 MED ORDER — MONTELUKAST SODIUM 10 MG PO TABS: 10.0000 mg | ORAL_TABLET | Freq: Every day | ORAL | Status: DC

## 2011-01-07 NOTE — Patient Instructions (Signed)
We will call you with the GI referral.  Go to the ED if you suddenly have Chest Pain that is not resolving.

## 2011-01-07 NOTE — Progress Notes (Signed)
Subjective:    Patient ID: [name], female    DOB: [birth year], 60 y.o.   MRN: [id number]  HPI Mood- doing well overall. Has felt more tired. Not exercising.  She is unhappy with her weight.  Dysphagia for several months.  Feels like food is getting stuck in the center of her chest. Not every single time but often.  Says will get up and walk around and finally it will pass. Can last about a minute or so.  Dr. Donnie Coffin Dr. fuller is her GI specialist.   Occ will notices a tightening sensation in the center of the chest even with not eating.  Notices it when heart rate goes up, such as with exercise. That only lasts a few seconds.  She says it happens in the location where she also notices food getting stuck. Had to stop riding her bike until it passed. No sweating no radiating pain into her arms. No lightheadedness or dizziness with the episodes. No prior history of heart disease. No prior history of high blood pressure. She does have a history of hyperlipidemia. No family history of premature heart disease.  Review of Systems  BP 130/73  Pulse 60  Wt 141 lb (63.957 kg)    Allergies  Allergen Reactions  . Codeine     REACTION: hyper    Past Medical History  Diagnosis Date  . Volvulus   . Migraine with visual aura     optical  . Meniere's disease   . HSV (herpes simplex virus) anogenital infection   . Postmenopausal     Past Surgical History  Procedure Date  . Colon surgery 2003    resection/small bowel ischemia  . Hernia repair   . Ltcs   . Ovarian cyst removal   . Touma t tube      placement RT  . Small intestine surgery 02/2010    History   Social History  . Marital Status: Married    Spouse Name: N/A    Number of Children: N/A  . Years of Education: N/A   Occupational History  . Not on file.   Social History Main Topics  . Smoking status: Never Smoker   . Smokeless tobacco: Not on file  . Alcohol Use: 1.8 oz/week    3 Glasses of wine per week       per week  . Drug Use:   . Sexually Active:    Other Topics Concern  . Not on file   Social History Narrative  . No narrative on file    Family History  Problem Relation Age of Onset  . Depression Sister   . Depression Sister   .     Marland Kitchen Hypertension Mother   . Hypertension Father   . Depression Father   . Glaucoma Father     [name] had no medications administered during this visit.     Objective:   Physical Exam  Constitutional: She is oriented to person, place, and time. She appears well-developed and well-nourished.  HENT:  Head: Normocephalic and atraumatic.  Neck: Neck supple. No thyromegaly present.  Cardiovascular: Normal rate, regular rhythm and normal heart sounds.   Pulmonary/Chest: Effort normal and breath sounds normal.  Lymphadenopathy:    She has no cervical adenopathy.  Neurological: She is alert and oriented to person, place, and time.  Skin: Skin is warm and dry.  Psychiatric: She has a normal mood and affect. Her behavior is normal.  Assessment & Plan:  Dysphagia - I recommend GI referral for further evaluation with possible endoscopy to make sure that she doesn't have an esophageal stricture. Certainly if she has food that won't pass an argument and she has emergency department. Also recommend making sure that she is chewing food really well before swallowing is less likely to get stuck. I encouraged her to keep the followup with GI as this will make it worse of if not treated.  Atypical Chest Pain - her chest pain is certainly atypical and could be related to the dysphagia she is experiencing. We did perform an EKG today which shows normal sinus rhythm with no acute changes. Rate of 75 beats per minute. This is reassuring. I would like to address her dysphagia first to see if it could be related and not then I would like her to get a stress test. Patient agrees to care plan. I also reminded her that her chest pain last for more than  the 20-30 seconds that she normally experiences it continues to go to the emergency room for further evaluation. I reminded her that women can present in a typical fashion for coronary disease.

## 2011-01-07 NOTE — Assessment & Plan Note (Addendum)
Doing well on teh citalopram. She would like to stay on it.  PHQ-9 score of 5 today (mild) she is happy with the current dose. Followup needed in 4-6 months.

## 2011-01-13 ENCOUNTER — Other Ambulatory Visit: Payer: Self-pay | Admitting: Family Medicine

## 2011-01-13 NOTE — Telephone Encounter (Signed)
Pt called to check on her celexa refill that was suppose to be sent to [facility] mail order.  She received one script she needed but has not received the celexa, and was calling to make sure it was sent. Plan:  Reviewed the pt chart file and celexa was sent electronically on 01-07-11 to [facility]. Pt informed.  Told the pt to call [facility] to make sure they received and if not to have them call triage nurse and a verbal order can be given. Jarvis Newcomer, LPN [facility] Dimes

## 2011-01-16 ENCOUNTER — Other Ambulatory Visit: Payer: Self-pay | Admitting: Family Medicine

## 2011-01-16 DIAGNOSIS — F329 Major depressive disorder, single episode, unspecified: Secondary | ICD-10-CM

## 2011-01-16 MED ORDER — CITALOPRAM HYDROBROMIDE 20 MG PO TABS: 20.0000 mg | ORAL_TABLET | Freq: Every day | ORAL | Status: DC

## 2011-01-16 NOTE — Telephone Encounter (Signed)
Pt called and just got her 90 day refill of citalopram from [facility] and she accidentally dropped the pill bottle and the pills went out in the sink and got wet.  Pt called to ask for a 7 day supply until another refill would come from her [facility] mail order. Plan:  Sent electronically a # 7 day supply of citalopram 20 mg to [facility]. Jarvis Newcomer, LPN [facility] Dimes

## 2011-01-19 ENCOUNTER — Inpatient Hospital Stay (INDEPENDENT_AMBULATORY_CARE_PROVIDER_SITE_OTHER)
Admission: RE | Admit: 2011-01-19 | Discharge: 2011-01-19 | Disposition: A | Discharge status: Home / Self Care | Payer: 59 | Source: Ambulatory Visit | Attending: Family Medicine | Admitting: Family Medicine

## 2011-01-19 ENCOUNTER — Encounter: Payer: Self-pay | Admitting: Family Medicine

## 2011-01-19 DIAGNOSIS — N3000 Acute cystitis without hematuria: Secondary | ICD-10-CM

## 2011-01-19 LAB — CONVERTED CEMR LAB
Blood in Urine, dipstick: 3
Glucose, Urine, Semiquant: NEGATIVE
Ketones, urine, test strip: NEGATIVE
Nitrite: NEGATIVE
pH: 7.5

## 2011-01-21 ENCOUNTER — Telehealth (INDEPENDENT_AMBULATORY_CARE_PROVIDER_SITE_OTHER): Payer: Self-pay | Admitting: *Deleted

## 2011-02-07 DIAGNOSIS — K449 Diaphragmatic hernia without obstruction or gangrene: Secondary | ICD-10-CM | POA: Insufficient documentation

## 2011-02-07 DIAGNOSIS — K259 Gastric ulcer, unspecified as acute or chronic, without hemorrhage or perforation: Secondary | ICD-10-CM | POA: Insufficient documentation

## 2011-02-10 DIAGNOSIS — K259 Gastric ulcer, unspecified as acute or chronic, without hemorrhage or perforation: Secondary | ICD-10-CM | POA: Insufficient documentation

## 2011-03-06 ENCOUNTER — Encounter: Payer: Self-pay | Admitting: Family Medicine

## 2011-03-10 ENCOUNTER — Other Ambulatory Visit: Payer: Self-pay | Admitting: Family Medicine

## 2011-03-10 MED ORDER — NYSTATIN 100000 UNIT/GM EX OINT: 1.0000 "application " | TOPICAL_OINTMENT | Freq: Two times a day (BID) | CUTANEOUS | Status: DC

## 2011-03-10 NOTE — Telephone Encounter (Signed)
[name] with [facility] called for refill of pt nystatin cream. Plan:  # 30 gram tube/1 refill sent to [facility] electronically. Abigail Newcomer, LPN [facility]imes

## 2011-03-25 ENCOUNTER — Other Ambulatory Visit: Payer: Self-pay | Admitting: Family Medicine

## 2011-03-25 ENCOUNTER — Other Ambulatory Visit: Payer: Self-pay | Admitting: *Deleted

## 2011-03-25 MED ORDER — TRIAMTERENE-HCTZ 75-50 MG PO TABS: 1.0000 | ORAL_TABLET | Freq: Two times a day (BID) | ORAL | Status: DC

## 2011-04-21 NOTE — Progress Notes (Signed)
Summary: ?UTI (room 2)   Vital Signs:  Patient Profile:   60 Years Old Female CC:      dysuria x 24 hours (recent UTI and Tx) Height:     62 inches Weight:      140 pounds O2 Sat:      100 % O2 treatment:    Room Air Temp:     98.3 degrees F oral Pulse rate:   69 / minute Resp:     16 per minute BP sitting:   133 / 79  (left arm) Cuff size:   regular  Pt. in pain?   yes  Vitals Entered By: Lavell Islam RN (January 19, 2011 11:40 AM)                   Current Allergies (reviewed today): ! CODEINEHistory of Present Illness Chief Complaint: dysuria x 24 hours (recent UTI and Tx) History of Present Illness:  Subjective:  Patient presents complaining of UTI symptoms for 24 hours.  Complains of dysuria, frequency, hematuria, and urgency.  No  nocturia.  No abnormal vaginal discharge.  No fever/chills/sweats.  No abdominal pain.  No flank pain.  No nausea/vomiting.  Just finished a course of Septra two weeks ago for UTI with resolution of symptoms    REVIEW OF SYSTEMS Constitutional Symptoms      Denies fever, chills, night sweats, weight loss, weight gain, and fatigue.  Eyes       Denies change in vision, eye pain, eye discharge, glasses, contact lenses, and eye surgery. Ear/Nose/Throat/Mouth       Denies hearing loss/aids, change in hearing, ear pain, ear discharge, dizziness, frequent runny nose, frequent nose bleeds, sinus problems, sore throat, hoarseness, and tooth pain or bleeding.  Respiratory       Denies dry cough, productive cough, wheezing, shortness of breath, asthma, bronchitis, and emphysema/COPD.  Cardiovascular       Denies murmurs, chest pain, and tires easily with exhertion.    Gastrointestinal       Denies stomach pain, nausea/vomiting, diarrhea, constipation, blood in bowel movements, and indigestion. Genitourniary       Complains of painful urination.      Denies kidney stones and loss of urinary control. Neurological       Denies paralysis,  seizures, and fainting/blackouts. Musculoskeletal       Denies muscle pain, joint pain, joint stiffness, decreased range of motion, redness, swelling, muscle weakness, and gout.  Skin       Denies bruising, unusual mles/lumps or sores, and hair/skin or nail changes.  Psych       Denies mood changes, temper/anger issues, anxiety/stress, speech problems, depression, and sleep problems. Other Comments: dysuria and hematuria x 24 hours    Past History:  Past Medical History: Reviewed history from 08/29/2009 and no changes required. fish oil capsules--gi upset  hx volvulus  optical migraines w/ nystagmus  Severe Meniere`s Disease  uses Minette device HSV postmenopausal since age 67  Past Surgical History: Reviewed history from 03/04/2006 and no changes required. colon resection- small bowel ischemia  hiatal hernia repair  LTCS  ovarian cyst removed  [place] T tube placement Right  Family History: Reviewed history from 08/29/2009 and no changes required. 2 sisters both w/ depression,  father died of Parkinsons Dz, HTN, depression, glaucoma mother alive, HTN  Social History: Works in Research [employer], political party.  Married to [place].  Has 46 yo son in college.  Nonsmoker.  3 glasses of wine per wk.  1 soda daily.  Wants to resume regular exercise training for bike-a-thon (01-19-11)   Objective:  Appearance:  Patient appears healthy, stated age, and in no acute distress  Eyes:  Pupils are equal, round, and reactive to light and accomodation.  Extraocular movement is intact.  Conjunctivae are not inflamed.  Mouth:  moist mucous membranes  Neck:  Supple.  No adenopathy is present.  Lungs:  Clear to auscultation.  Breath sounds are equal.  Heart:  Regular rate and rhythm without murmurs, rubs, or gallops.  Abdomen:  Nontender without masses or hepatosplenomegaly.  Bowel sounds are present.  No CVA or flank tenderness.  urinalysis (dipstick):  1+ leuks, 3+ blood Assessment New Problems: CYSTITIS,  ACUTE (ICD-595.0)   Plan New Medications/Changes: CIPROFLOXACIN HCL 250 MG TABS (CIPROFLOXACIN HCL) 1 po two times a day  #10 x 0, 01/19/2011, Donna Christen MD  New Orders: T-Culture, Urine [[id number]] Urinalysis [[id number]] Services provided After hours-Weekends-Holidays [99051] New Patient Level III [99203] Planning Comments:   Urine culture pending.  May use OTC Azo. Begin Cipro for 5 days.  Continue increased fluids. Follow-up with PCP if not improving or if symptoms worsen.   The patient and/or caregiver has been counseled thoroughly with regard to medications prescribed including dosage, schedule, interactions, rationale for use, and possible side effects and they verbalize understanding.  Diagnoses and expected course of recovery discussed and will return if not improved as expected or if the condition worsens. Patient and/or caregiver verbalized understanding.  Prescriptions: CIPROFLOXACIN HCL 250 MG TABS (CIPROFLOXACIN HCL) 1 po two times a day  #10 x 0   Entered and Authorized by:   Donna Christen MD   Signed by:   Donna Christen MD on 01/19/2011   Method used:   Print then Give to Patient   RxID:   [id number]   Orders Added: 1)  T-Culture, Urine [[id number]] 2)  Urinalysis [[id number]] 3)  Services provided After hours-Weekends-Holidays [99051] 4)  New Patient Level III [99203]    Laboratory Results   Urine Tests  Date/Time Received: January 19, 2011 11:44 AM  Date/Time Reported: January 19, 2011 11:44 AM   Routine Urinalysis   Color: lt. yellow Appearance: Hazy Glucose: negative   (Normal Range: Negative) Bilirubin: negative   (Normal Range: Negative) Ketone: negative   (Normal Range: Negative) Spec. Gravity: 1.015   (Normal Range: 1.003-1.035) Blood: +3   (Normal Range: Negative) pH: 7.5   (Normal Range: 5.0-8.0) Protein: negative   (Normal Range: Negative) Urobilinogen: 0.2   (Normal Range: 0-1) Nitrite: negative   (Normal Range:  Negative) Leukocyte Esterace: +1   (Normal Range: Negative)    Comments: Treated for UTI mid-August 2012

## 2011-04-21 NOTE — Telephone Encounter (Signed)
  Phone Note Outgoing Call   Call placed by: Clemens Catholic LPN,  January 21, 2011 4:23 PM Call placed to: Patient Summary of Call: call back: left message with culture results and to call back if she has any questions or concerns. Initial call taken by: Clemens Catholic LPN,  January 21, 2011 4:26 PM

## 2011-04-24 ENCOUNTER — Ambulatory Visit: Payer: 59

## 2011-04-30 ENCOUNTER — Other Ambulatory Visit: Payer: Self-pay | Admitting: *Deleted

## 2011-04-30 MED ORDER — MONTELUKAST SODIUM 10 MG PO TABS: 10.0000 mg | ORAL_TABLET | Freq: Every day | ORAL | Status: DC

## 2011-05-21 ENCOUNTER — Other Ambulatory Visit: Payer: Self-pay | Admitting: *Deleted

## 2011-05-21 MED ORDER — TRIAMTERENE-HCTZ 75-50 MG PO TABS: 1.0000 | ORAL_TABLET | Freq: Every day | ORAL | Status: DC

## 2011-07-02 ENCOUNTER — Other Ambulatory Visit: Payer: Self-pay | Admitting: *Deleted

## 2011-07-02 MED ORDER — ACYCLOVIR 5 % EX OINT: TOPICAL_OINTMENT | CUTANEOUS | Status: DC

## 2011-07-08 ENCOUNTER — Ambulatory Visit (INDEPENDENT_AMBULATORY_CARE_PROVIDER_SITE_OTHER): Payer: 59 | Admitting: Physician Assistant

## 2011-07-08 ENCOUNTER — Encounter: Payer: Self-pay | Admitting: Physician Assistant

## 2011-07-08 VITALS — BP 124/76 | HR 88 | Wt 145.0 lb

## 2011-07-08 DIAGNOSIS — J45909 Unspecified asthma, uncomplicated: Secondary | ICD-10-CM

## 2011-07-08 DIAGNOSIS — J40 Bronchitis, not specified as acute or chronic: Secondary | ICD-10-CM

## 2011-07-08 MED ORDER — PREDNISONE 50 MG PO TABS: ORAL_TABLET | ORAL | Status: DC

## 2011-07-08 MED ORDER — AZITHROMYCIN 250 MG PO TABS: ORAL_TABLET | ORAL | Status: AC

## 2011-07-08 NOTE — Patient Instructions (Addendum)
Start zpak and prednisone for 5 days. Continue to use Ventolin as needed and Qvar daily. Call office if not improving in 48 hours. If still needing ventolin on a regular basis then come in to office to see if we need to change Qvar back to advair.

## 2011-07-08 NOTE — Progress Notes (Signed)
  Subjective:    Patient ID: [name], female    DOB: 06-Jul-1950, 61 y.o.   MRN: [id number]  HPI Patient presents to clinic because she has not felt well for 2 weeks. Patient reports that patients has had 2-3 viral illnesses this winter. She thought this sickness would resolve also but she has had a more difficult time breathing this time. She has had to use ventolin inhaler up to 6 times a day. She has tried Mucinex but it aggravates her Menires disease and makes her ears ring. Nyquil has helped. She denies fever, chills, sore throat, or ear pain. She has had sinus pressure and headaches.   6 months ago she was switched from Advair to Qvar. She did well until December 2 months ago.    Review of Systems     Objective:   Physical Exam  Constitutional: She is oriented to person, place, and time. She appears well-developed and well-nourished.  HENT:  Head: Normocephalic and atraumatic.  Right Ear: External ear normal.  Left Ear: External ear normal.  Nose: Nose normal.  Mouth/Throat: Oropharynx is clear and moist.  Eyes: Conjunctivae are normal.  Neck: Normal range of motion. Neck supple.  Cardiovascular: Normal rate, regular rhythm and normal heart sounds.   Pulmonary/Chest: She has wheezes.       Decreased air movement. Bilateral wheezing in bilateral lungs all fields.   Neurological: She is alert and oriented to person, place, and time.  Skin: Skin is warm and dry.  Psychiatric: She has a normal mood and affect. Her behavior is normal.          Assessment & Plan:  Bronchitis with acute asthma- Start zpak and prednisone for 5 days. Continue to use Ventolin as needed and Qvar daily. Call office if not improving in 48 hours. If still needing ventolin on a regular basis then come in to office to see if we need to change Qvar back to advair. Reeducated patient on the proper use of inhaler.

## 2011-07-10 ENCOUNTER — Other Ambulatory Visit: Payer: Self-pay | Admitting: *Deleted

## 2011-07-10 MED ORDER — ALBUTEROL SULFATE HFA 108 (90 BASE) MCG/ACT IN AERS: 2.0000 | INHALATION_SPRAY | Freq: Four times a day (QID) | RESPIRATORY_TRACT | Status: DC | PRN

## 2011-07-10 MED ORDER — BECLOMETHASONE DIPROPIONATE 80 MCG/ACT IN AERS: 1.0000 | INHALATION_SPRAY | RESPIRATORY_TRACT | Status: DC | PRN

## 2011-07-11 ENCOUNTER — Ambulatory Visit (INDEPENDENT_AMBULATORY_CARE_PROVIDER_SITE_OTHER): Payer: BC Managed Care – PPO | Admitting: Family Medicine

## 2011-07-11 DIAGNOSIS — J45901 Unspecified asthma with (acute) exacerbation: Secondary | ICD-10-CM

## 2011-07-11 DIAGNOSIS — J40 Bronchitis, not specified as acute or chronic: Secondary | ICD-10-CM

## 2011-07-11 MED ORDER — PREDNISONE 20 MG PO TABS: 20.0000 mg | ORAL_TABLET | Freq: Every day | ORAL | Status: AC

## 2011-07-11 MED ORDER — BUDESONIDE-FORMOTEROL FUMARATE 80-4.5 MCG/ACT IN AERO: 2.0000 | INHALATION_SPRAY | Freq: Two times a day (BID) | RESPIRATORY_TRACT | Status: DC

## 2011-07-11 MED ORDER — LEVOFLOXACIN 500 MG PO TABS: 500.0000 mg | ORAL_TABLET | Freq: Every day | ORAL | Status: AC

## 2011-07-11 MED ORDER — ALBUTEROL SULFATE (5 MG/ML) 0.5% IN NEBU
3.5000 mg | INHALATION_SOLUTION | Freq: Once | RESPIRATORY_TRACT | Status: AC
  Administered 2011-07-11: 3.5 mg via RESPIRATORY_TRACT

## 2011-07-11 NOTE — Progress Notes (Signed)
  Subjective:    Patient ID: [name], female    DOB: 01-29-1951, 61 y.o.   MRN: [id number]  HPI Bronchitis with asthma exerabation. She was seen approximately 4 days ago. She has one more day of zpack.  She only has one more days of steorids. Says feels 25% better. She is worried because we are getting head the 2 head into the weekend. Still coughing a lot.  No other cold medications.  Last inhaler tx was 2 puffs at 3.5 hours ago. No fever or ear pain or pressure. She is feeling short of breath with activity. She says she feels like she has a lot of mucus in her chest but cannot seem to move it out. She tried taking some Mucinex D that it affected her Mnire's disease so she stopped it.   Review of Systems     Objective:   Physical Exam  Constitutional: She is oriented to person, place, and time. She appears well-developed and well-nourished.  HENT:  Head: Normocephalic and atraumatic.  Right Ear: External ear normal.  Left Ear: External ear normal.  Nose: Nose normal.  Mouth/Throat: Oropharynx is clear and moist.       TMs and canals are clear.   Eyes: Conjunctivae and EOM are normal. Pupils are equal, round, and reactive to light.  Neck: Neck supple. No thyromegaly present.  Cardiovascular: Normal rate, regular rhythm and normal heart sounds.   Pulmonary/Chest: Effort normal. She has no wheezes.       Expiratory rhonchi. Mild wheeze. Cough sounds deep and bronchitis.   Lymphadenopathy:    She has no cervical adenopathy.  Neurological: She is alert and oriented to person, place, and time.  Skin: Skin is warm and dry.  Psychiatric: She has a normal mood and affect.          Assessment & Plan:  Bronchitis w/ Asthma exacerbation - Peak flow in the yellow.  2.5 mg albuterol NEB given x 2.  Can finish the zpack and will place her on doxycycline and extend her steroids. Post peak flows improved to 250, this is still in the yellow zone. She actually felt better after the  second neb treatment.. Will change QVAR to Symbicort. Sample and to coupon card given.. Use albuterol every 4-6 hours prn. Followup next week if she's not feeling better. Otherwise followup in 2 months. I did recommend a trial of Mucinex without the decongestant. She should be able to tolerate this well and it should not affect her Mnire's.

## 2011-07-11 NOTE — Patient Instructions (Addendum)
Check with insurance to see if cover the shingles vaccine.  Can use your albuterol every 4-6 hours as needed.  2-4 puffs.   Pick up new antibiotic but complete the zpack.

## 2011-07-16 ENCOUNTER — Other Ambulatory Visit: Payer: Self-pay | Admitting: *Deleted

## 2011-07-16 DIAGNOSIS — F329 Major depressive disorder, single episode, unspecified: Secondary | ICD-10-CM

## 2011-07-16 MED ORDER — CITALOPRAM HYDROBROMIDE 20 MG PO TABS: 20.0000 mg | ORAL_TABLET | Freq: Every day | ORAL | Status: DC

## 2011-09-08 ENCOUNTER — Ambulatory Visit (INDEPENDENT_AMBULATORY_CARE_PROVIDER_SITE_OTHER): Payer: BC Managed Care – PPO | Admitting: Physician Assistant

## 2011-09-13 ENCOUNTER — Other Ambulatory Visit: Payer: Self-pay | Admitting: Family Medicine

## 2011-10-10 ENCOUNTER — Other Ambulatory Visit: Payer: Self-pay | Admitting: *Deleted

## 2011-10-10 MED ORDER — MONTELUKAST SODIUM 10 MG PO TABS: 10.0000 mg | ORAL_TABLET | Freq: Every day | ORAL | Status: DC

## 2011-10-23 ENCOUNTER — Other Ambulatory Visit: Payer: Self-pay | Admitting: Family Medicine

## 2011-11-11 ENCOUNTER — Other Ambulatory Visit: Payer: Self-pay | Admitting: Family Medicine

## 2011-11-11 DIAGNOSIS — Z1231 Encounter for screening mammogram for malignant neoplasm of breast: Secondary | ICD-10-CM

## 2011-11-25 ENCOUNTER — Ambulatory Visit (INDEPENDENT_AMBULATORY_CARE_PROVIDER_SITE_OTHER): Payer: BC Managed Care – PPO | Admitting: Family Medicine

## 2011-11-25 ENCOUNTER — Encounter: Payer: Self-pay | Admitting: Family Medicine

## 2011-11-25 ENCOUNTER — Ambulatory Visit: Payer: BC Managed Care – PPO

## 2011-11-25 ENCOUNTER — Other Ambulatory Visit (HOSPITAL_COMMUNITY)
Admission: RE | Admit: 2011-11-25 | Discharge: 2011-11-25 | Disposition: A | Discharge status: Home / Self Care | Payer: BC Managed Care – PPO | Source: Ambulatory Visit | Attending: Family Medicine | Admitting: Family Medicine

## 2011-11-25 VITALS — BP 132/74 | HR 83 | Wt 147.0 lb

## 2011-11-25 DIAGNOSIS — Z01419 Encounter for gynecological examination (general) (routine) without abnormal findings: Secondary | ICD-10-CM | POA: Insufficient documentation

## 2011-11-25 DIAGNOSIS — Z0000 Encounter for general adult medical examination without abnormal findings: Secondary | ICD-10-CM

## 2011-11-25 DIAGNOSIS — Z1159 Encounter for screening for other viral diseases: Secondary | ICD-10-CM | POA: Insufficient documentation

## 2011-11-25 DIAGNOSIS — Z780 Asymptomatic menopausal state: Secondary | ICD-10-CM

## 2011-11-25 MED ORDER — VALACYCLOVIR HCL 1 G PO TABS: 1000.0000 mg | ORAL_TABLET | Freq: Two times a day (BID) | ORAL | Status: DC

## 2011-11-25 MED ORDER — SUMATRIPTAN SUCCINATE 100 MG PO TABS: 100.0000 mg | ORAL_TABLET | ORAL | Status: AC | PRN

## 2011-11-25 MED ORDER — DIAZEPAM 5 MG PO TABS: 5.0000 mg | ORAL_TABLET | Freq: Every evening | ORAL | Status: DC | PRN

## 2011-11-25 MED ORDER — TRIAMTERENE-HCTZ 75-50 MG PO TABS: 1.0000 | ORAL_TABLET | Freq: Every day | ORAL | Status: DC

## 2011-11-25 MED ORDER — NYSTATIN 100000 UNIT/GM EX OINT: 1.0000 "application " | TOPICAL_OINTMENT | Freq: Two times a day (BID) | CUTANEOUS | Status: DC

## 2011-11-25 MED ORDER — CITALOPRAM HYDROBROMIDE 20 MG PO TABS: 20.0000 mg | ORAL_TABLET | Freq: Every day | ORAL | Status: DC

## 2011-11-25 MED ORDER — MONTELUKAST SODIUM 10 MG PO TABS: 10.0000 mg | ORAL_TABLET | Freq: Every day | ORAL | Status: DC

## 2011-11-25 NOTE — Progress Notes (Signed)
Subjective:     [name] is a 61 y.o. female and is here for a comprehensive physical exam. The patient reports problems - none today.  Says fatigued and it quitting her job. last day is friday. Has mammo schedule today.  She recently had herpes outbreak. Needs refill on meds    History   Social History  . Marital Status: Married    Spouse Name: N/A    Number of Children: N/A  . Years of Education: N/A   Occupational History  . leasing agent     [employer]   Social History Main Topics  . Smoking status: Never Smoker   . Smokeless tobacco: Not on file  . Alcohol Use: 1.8 oz/week    3 Glasses of wine per week     per week  . Drug Use: Not on file  . Sexually Active: Yes -- Female partner(s)   Other Topics Concern  . Not on file   Social History Narrative   Working out twice per week.     Health Maintenance  Topic Date Due  . Zostavax  02/11/2011  . Mammogram  10/04/2011  . Influenza Vaccine  02/17/2012  . Pap Smear  02/04/2013  . Colonoscopy  11/15/2015  . Tetanus/tdap  10/19/2016    The following portions of the patient's history were reviewed and updated as appropriate: allergies, current medications, past family history, past medical history, past social history, past surgical history and problem list.  Review of Systems A comprehensive review of systems was negative.   Objective:    BP 132/74  Pulse 83  Wt 147 lb (66.679 kg) General appearance: alert, cooperative and appears stated age Head: Normocephalic, without obvious abnormality, atraumatic Eyes: conj clear, EOMi. PEERLA Ears: normal TM's and external ear canals both ears Nose: Nares normal. Septum midline. Mucosa normal. No drainage or sinus tenderness. Throat: lips, mucosa, and tongue normal; teeth and gums normal Neck: no adenopathy, no carotid bruit, no JVD, supple, symmetrical, trachea midline and thyroid not enlarged, symmetric, no tenderness/mass/nodules Back: symmetric, no curvature. ROM  normal. No CVA tenderness. Lungs: clear to auscultation bilaterally Breasts: normal appearance, no masses or tenderness Heart: regular rate and rhythm, S1, S2 normal, no murmur, click, rub or gallop Abdomen: soft, non-tender; bowel sounds normal; no masses,  no organomegaly Pelvic: cervix normal in appearance, external genitalia normal, no adnexal masses or tenderness, no cervical motion tenderness, rectovaginal septum normal, uterus normal size, shape, and consistency and vagina normal without discharge Extremities: extremities normal, atraumatic, no cyanosis or edema Pulses: 2+ and symmetric Skin: Skin color, texture, turgor normal. No rashes or lesions Lymph nodes: Cervical, supraclavicular, and axillary nodes normal. Neurologic: Alert and oriented X 3, normal strength and tone. Normal symmetric reflexes. Normal coordination and gait    Assessment:    Healthy female exam.      Plan:     See After Visit Summary for Counseling Recommendations  Start a regular exercise program and make sure you are eating a healthy diet Try to eat 4 servings of dairy a day  She's getting a mammogram today. Willl monitor for the report.  She's due for screening labwork including cholesterol.  She has gained a lot of weight but she feels it is most likely from stress. But we will go ahead and check a TSH today as well. She is also due for bone density test and we'll get this scheduled for her. Your vaccines are up to date.  Discussed shingles vaccine.  She will check with her insurance to see if covered.    HSV= meds refilled  Hyperlipidemia - Recheck chol levels.

## 2011-11-25 NOTE — Patient Instructions (Addendum)
Check your insurance to see if they cover the shingles vaccine.  Start a regular exercise program and make sure you are eating a healthy diet Try to eat 4 servings of dairy a day  Your vaccines are up to date.

## 2011-11-26 LAB — COMPLETE METABOLIC PANEL WITH GFR
BUN: 8 mg/dL (ref 6–23)
CO2: 30 mEq/L (ref 19–32)
Creat: 0.69 mg/dL (ref 0.50–1.10)
GFR, Est African American: >89 mL/min
GFR, Est Non African American: >89 mL/min
Glucose, Bld: 92 mg/dL (ref 70–99)
Total Bilirubin: 0.4 mg/dL (ref 0.3–1.2)

## 2011-11-26 LAB — LIPID PANEL
HDL: 100 mg/dL (ref >39)
Triglycerides: 47 mg/dL (ref <150)

## 2011-11-27 LAB — VITAMIN D 25 HYDROXY (VIT D DEFICIENCY, FRACTURES): Vit D, 25-Hydroxy: 40 ng/mL (ref 30–89)

## 2011-12-02 ENCOUNTER — Other Ambulatory Visit: Payer: Self-pay | Admitting: *Deleted

## 2011-12-02 MED ORDER — ACYCLOVIR 5 % EX OINT: TOPICAL_OINTMENT | CUTANEOUS | Status: DC

## 2011-12-05 ENCOUNTER — Other Ambulatory Visit: Payer: Self-pay | Admitting: Family Medicine

## 2012-05-18 ENCOUNTER — Other Ambulatory Visit: Payer: Self-pay | Admitting: Family Medicine

## 2012-05-21 ENCOUNTER — Other Ambulatory Visit: Payer: Self-pay | Admitting: *Deleted

## 2012-05-21 MED ORDER — TRIAMTERENE-HCTZ 75-50 MG PO TABS: 1.0000 | ORAL_TABLET | Freq: Every day | ORAL | Status: DC

## 2012-06-08 ENCOUNTER — Ambulatory Visit (INDEPENDENT_AMBULATORY_CARE_PROVIDER_SITE_OTHER): Payer: BC Managed Care – PPO | Admitting: Family Medicine

## 2012-06-08 DIAGNOSIS — Z23 Encounter for immunization: Secondary | ICD-10-CM

## 2012-06-08 NOTE — Progress Notes (Signed)
  Subjective:    Patient ID: [name], female    DOB: 1951/04/15, 62 y.o.   MRN: [id number]  HPI    Review of Systems     Objective:   Physical Exam        Assessment & Plan:  Flu vaccine given today.

## 2012-06-17 ENCOUNTER — Ambulatory Visit (INDEPENDENT_AMBULATORY_CARE_PROVIDER_SITE_OTHER): Payer: BC Managed Care – PPO | Admitting: Family Medicine

## 2012-06-17 ENCOUNTER — Encounter: Payer: Self-pay | Admitting: Family Medicine

## 2012-06-17 VITALS — BP 124/69 | HR 84 | Ht 62.0 in | Wt 147.0 lb

## 2012-06-17 DIAGNOSIS — I10 Essential (primary) hypertension: Secondary | ICD-10-CM

## 2012-06-17 DIAGNOSIS — R079 Chest pain, unspecified: Secondary | ICD-10-CM

## 2012-06-17 DIAGNOSIS — Z1231 Encounter for screening mammogram for malignant neoplasm of breast: Secondary | ICD-10-CM

## 2012-06-17 DIAGNOSIS — J45909 Unspecified asthma, uncomplicated: Secondary | ICD-10-CM

## 2012-06-17 NOTE — Progress Notes (Addendum)
  Subjective:    Patient ID: [name], female    DOB: 08/03/1950, 62 y.o.   MRN: [id number]  HPI HTN -  Pt denies SOB, dizziness, or heart palpitations.  Taking meds as directed w/o problems.  Denies medication side effects.    Some occ chest pressure that lasts a few seconds when exercising.  Started a copule ofo months ago.   Does have a hiatal hernia.  She says she has gained weight but says she really hasn't tried hard to loose it. Says only lasts a few seconds. Mid sternal area.  NO radiation of pain or discomfort.   Better with rest.  Hasn't been walking much lately.  No diaphoresis or palpitations.    Asthma - Stopped her steroids inhalers bc wanted to get off them. Says feels like doesn't breath quite as well without them. Says does pretreat before exercise. She is using her rescue inhaler less than twice a week. She's not having any nighttime symptoms.   Review of Systems     Objective:   Physical Exam  Constitutional: She is oriented to person, place, and time. She appears well-developed and well-nourished.  HENT:  Head: Normocephalic and atraumatic.  Neck: Neck supple. No thyromegaly present.  Cardiovascular: Normal rate, regular rhythm and normal heart sounds.        No carotid bruits.   Pulmonary/Chest: Effort normal and breath sounds normal.  Musculoskeletal: She exhibits no edema.  Lymphadenopathy:    She has no cervical adenopathy.  Neurological: She is alert and oriented to person, place, and time.  Skin: Skin is warm and dry.  Psychiatric: She has a normal mood and affect. Her behavior is normal.          Assessment & Plan:  HTN-well controlled. Continue current regimen. Refill sent to pharmacy. Followup in 6 months. Due for CMP and fasting lipid panel. Lab slip given today.  Chest Pain - EKG show rate of 69 bpm, NSR, leftwards axis, in` Inverted t wave in lead III,  No acute changes.  Will refer for stress testing with treadmill since pain is  substernal, happen with activity and gets better with rest.   Asthma-even though she has stoppped her advair, she's  actually doing well over all and very rarely uses her albuterol for rescue. The she is pretreated before exercise think is very appropriate. If she starts using her albuterol as a rescue twice a week or at night and we need to consider putting her back on a low-dose inhaled corticosteroid.  Overdue for screening mammogram. Patient agreed to go with schedule it.

## 2012-06-18 ENCOUNTER — Encounter: Payer: Self-pay | Admitting: *Deleted

## 2012-06-18 LAB — COMPLETE METABOLIC PANEL WITH GFR
ALT: 17 U/L (ref 0–35)
Albumin: 3.9 g/dL (ref 3.5–5.2)
CO2: 27 mEq/L (ref 19–32)
Calcium: 8.8 mg/dL (ref 8.4–10.5)
Chloride: 101 mEq/L (ref 96–112)
Creat: 0.71 mg/dL (ref 0.50–1.10)
GFR, Est African American: >89 mL/min
Potassium: 4.5 mEq/L (ref 3.5–5.3)

## 2012-06-18 LAB — LIPID PANEL
LDL Cholesterol: 132 mg/dL — ABNORMAL HIGH (ref 0–99)
Triglycerides: 54 mg/dL (ref <150)

## 2012-06-19 ENCOUNTER — Other Ambulatory Visit: Payer: Self-pay | Admitting: Family Medicine

## 2012-06-19 NOTE — Addendum Note (Signed)
Addended by: Nani Gasser D on: 06/19/2012 02:44 PM   Modules accepted: Orders

## 2012-06-21 ENCOUNTER — Other Ambulatory Visit: Payer: Self-pay | Admitting: Family Medicine

## 2012-06-24 ENCOUNTER — Telehealth: Payer: Self-pay | Admitting: Family Medicine

## 2012-06-24 NOTE — Telephone Encounter (Signed)
I called patient to give her the appt info about her Exc Tolerance Test and she informed me she does not want to do this at this time, due to the high insurance deductable. Thanks,Jennifer

## 2012-06-24 NOTE — Telephone Encounter (Signed)
Ultimately it is up to her but I would strongly encourage her to reconsider or if would like ot see a cardiologist first we can certainly refer her.

## 2012-06-25 NOTE — Telephone Encounter (Signed)
Called pt to find out if pt would NOT like to be referred to cardiology. She would however like to know the results of her EKG that would help her to make a better decision to what steps she should take. I will forward this to Dr. Linford Arnold for follow up.[name]

## 2012-06-25 NOTE — Telephone Encounter (Signed)
Her EKG was relatively normal. Was more concerned about the fact that she was having chest pain during exercise. This is more concerning for cardiac disease. An EKG doesn't always show if she has underlying coronary artery disease.

## 2012-06-29 NOTE — Telephone Encounter (Signed)
LMOM for pt to return call. 

## 2012-07-01 ENCOUNTER — Encounter: Payer: BC Managed Care – PPO | Admitting: Physician Assistant

## 2012-07-01 ENCOUNTER — Ambulatory Visit: Payer: BC Managed Care – PPO

## 2012-07-08 ENCOUNTER — Ambulatory Visit (INDEPENDENT_AMBULATORY_CARE_PROVIDER_SITE_OTHER): Payer: BC Managed Care – PPO

## 2012-07-08 DIAGNOSIS — Z1231 Encounter for screening mammogram for malignant neoplasm of breast: Secondary | ICD-10-CM

## 2012-07-12 ENCOUNTER — Other Ambulatory Visit: Payer: Self-pay | Admitting: Family Medicine

## 2012-07-12 DIAGNOSIS — R928 Other abnormal and inconclusive findings on diagnostic imaging of breast: Secondary | ICD-10-CM

## 2012-07-22 ENCOUNTER — Other Ambulatory Visit: Payer: BC Managed Care – PPO

## 2012-08-02 ENCOUNTER — Other Ambulatory Visit: Payer: BC Managed Care – PPO

## 2012-08-02 ENCOUNTER — Inpatient Hospital Stay: Admission: RE | Admit: 2012-08-02 | Payer: BC Managed Care – PPO | Source: Ambulatory Visit

## 2012-08-11 ENCOUNTER — Ambulatory Visit
Admission: RE | Admit: 2012-08-11 | Discharge: 2012-08-11 | Disposition: A | Discharge status: Home / Self Care | Payer: BC Managed Care – PPO | Source: Ambulatory Visit | Attending: Family Medicine | Admitting: Family Medicine

## 2012-08-11 DIAGNOSIS — R928 Other abnormal and inconclusive findings on diagnostic imaging of breast: Secondary | ICD-10-CM

## 2012-08-25 ENCOUNTER — Ambulatory Visit (INDEPENDENT_AMBULATORY_CARE_PROVIDER_SITE_OTHER): Payer: BC Managed Care – PPO | Admitting: Physician Assistant

## 2012-08-25 ENCOUNTER — Encounter: Payer: Self-pay | Admitting: Physician Assistant

## 2012-08-25 VITALS — BP 115/70 | HR 68 | Ht 62.0 in | Wt 144.0 lb

## 2012-08-25 DIAGNOSIS — K649 Unspecified hemorrhoids: Secondary | ICD-10-CM

## 2012-08-25 DIAGNOSIS — L290 Pruritus ani: Secondary | ICD-10-CM

## 2012-08-25 DIAGNOSIS — J45909 Unspecified asthma, uncomplicated: Secondary | ICD-10-CM

## 2012-08-25 MED ORDER — HYDROCORTISONE ACE-PRAMOXINE 2.5-1 % RE CREA: TOPICAL_CREAM | Freq: Two times a day (BID) | RECTAL | Status: DC

## 2012-08-25 MED ORDER — HYDROCORTISONE ACETATE 25 MG RE SUPP: 25.0000 mg | Freq: Two times a day (BID) | RECTAL | Status: DC

## 2012-08-25 MED ORDER — BECLOMETHASONE DIPROPIONATE 80 MCG/ACT IN AERS: 1.0000 | INHALATION_SPRAY | RESPIRATORY_TRACT | Status: DC | PRN

## 2012-08-25 NOTE — Patient Instructions (Addendum)
   Hemorrhoids  Hemorrhoids are enlarged (dilated) veins around the rectum. There are 2 types of hemorrhoids, and the type of hemorrhoid is determined by its location. Internal hemorrhoids occur in the veins just inside the rectum.They are usually not painful, but they may bleed.However, they may poke through to the outside and become irritated and painful. External hemorrhoids involve the veins outside the anus and can be felt as a painful swelling or hard lump near the anus.They are often itchy and may crack and bleed. Sometimes clots will form in the veins. This makes them swollen and painful. These are called thrombosed hemorrhoids.  CAUSES  Causes of hemorrhoids include:   Pregnancy. This increases the pressure in the hemorrhoidal veins.   Constipation.   Straining to have a bowel movement.   Obesity.   Heavy lifting or other activity that caused you to strain.  TREATMENT  Most of the time hemorrhoids improve in 1 to 2 weeks. However, if symptoms do not seem to be getting better or if you have a lot of rectal bleeding, your caregiver may perform a procedure to help make the hemorrhoids get smaller or remove them completely.Possible treatments include:   Rubber band ligation. A rubber band is placed at the base of the hemorrhoid to cut off the circulation.   Sclerotherapy. A chemical is injected to shrink the hemorrhoid.   Infrared light therapy. Tools are used to burn the hemorrhoid.   Hemorrhoidectomy. This is surgical removal of the hemorrhoid.  HOME CARE INSTRUCTIONS    Increase fiber in your diet. Ask your caregiver about using fiber supplements.   Drink enough water and fluids to keep your urine clear or pale yellow.   Exercise regularly.   Go to the bathroom when you have the urge to have a bowel movement. Do not wait.   Avoid straining to have bowel movements.   Keep the anal area dry and clean.   Only take over-the-counter or prescription medicines for pain, discomfort, or fever as  directed by your caregiver.  If your hemorrhoids are thrombosed:   Take warm sitz baths for 20 to 30 minutes, 3 to 4 times per day.   If the hemorrhoids are very tender and swollen, place ice packs on the area as tolerated. Using ice packs between sitz baths may be helpful. Fill a plastic bag with ice. Place a towel between the bag of ice and your skin.   Medicated creams and suppositories may be used or applied as directed.   Do not use a donut-shaped pillow or sit on the toilet for long periods. This increases blood pooling and pain.  SEEK MEDICAL CARE IF:    You have increasing pain and swelling that is not controlled with your medicine.   You have uncontrolled bleeding.   You have difficulty or you are unable to have a bowel movement.   You have pain or inflammation outside the area of the hemorrhoids.   You have chills or an oral temperature above 102 F (38.9 C).  MAKE SURE YOU:    Understand these instructions.   Will watch your condition.   Will get help right away if you are not doing well or get worse.  Document Released: 05/02/2000 Document Revised: 07/28/2011 Document Reviewed: 04/15/2010  [employer] Patient Information 2013 [employer].

## 2012-08-25 NOTE — Progress Notes (Signed)
  Subjective:    Patient ID: [name], female    DOB: 11-01-1950, 62 y.o.   MRN: [id number]  HPI Patient presents to clinic with anal itching and medication refill.  Patient has noticed the rectal itching for the last month. She's been using preparation H. cream daily. It helps some but lately the itching has gotten much worse. 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 They cleared up with preperation H. She has started sitting more with her new job. Denies any blood in stool. She has wiped a few times and seen a spot of blood on toilet tissue. The itching is so bad that is all she thinks about. She has tried warm compresses and still only minimal benefit. She does have a history of hemorrhoids.     Asthma-is well controlled patient needs Rx for Qvar.  Review of Systems     Objective:   Physical Exam  Constitutional: She is oriented to person, place, and time. She appears well-developed and well-nourished.  HENT:  Head: Normocephalic and atraumatic.  Cardiovascular: Normal rate, regular rhythm and normal heart sounds.   Pulmonary/Chest: Effort normal and breath sounds normal. She has no wheezes.  Genitourinary: Guaiac negative stool.  Multiple rectal hemorrhoids. Internal exam was negative.   Neurological: She is alert and oriented to person, place, and time.  Skin: Skin is warm and dry.  Psychiatric: She has a normal mood and affect. Her behavior is normal.          Assessment & Plan:  Hemorrhoids/anal itching- Gave suppositories and cream for both internal and external hemorrhoids. Gave handout for symptomatic care. Discussed sitz baths regularly. Call if not improving in the next couple of days.   Asthma- refilled Qvar.

## 2012-09-15 ENCOUNTER — Other Ambulatory Visit: Payer: Self-pay | Admitting: Family Medicine

## 2012-10-03 ENCOUNTER — Other Ambulatory Visit: Payer: Self-pay | Admitting: Family Medicine

## 2012-11-26 ENCOUNTER — Other Ambulatory Visit: Payer: Self-pay | Admitting: Family Medicine

## 2012-12-06 ENCOUNTER — Other Ambulatory Visit: Payer: Self-pay | Admitting: Family Medicine

## 2013-01-03 ENCOUNTER — Other Ambulatory Visit: Payer: Self-pay | Admitting: Family Medicine

## 2013-01-03 DIAGNOSIS — N6320 Unspecified lump in the left breast, unspecified quadrant: Secondary | ICD-10-CM

## 2013-01-04 ENCOUNTER — Ambulatory Visit: Payer: BC Managed Care – PPO | Admitting: Family Medicine

## 2013-01-05 ENCOUNTER — Encounter: Payer: Self-pay | Admitting: Family Medicine

## 2013-01-05 ENCOUNTER — Ambulatory Visit (INDEPENDENT_AMBULATORY_CARE_PROVIDER_SITE_OTHER): Payer: BC Managed Care – PPO | Admitting: Family Medicine

## 2013-01-05 VITALS — BP 142/84 | HR 79 | Wt 147.0 lb

## 2013-01-05 DIAGNOSIS — M659 Synovitis and tenosynovitis, unspecified: Secondary | ICD-10-CM

## 2013-01-05 DIAGNOSIS — M7751 Other enthesopathy of right foot: Secondary | ICD-10-CM

## 2013-01-05 NOTE — Progress Notes (Signed)
CC: [name] is a 62 y.o. female is here for rt ankle pain   Subjective: HPI:  Patient has a right anterior ankle pain is mild to moderate severity it has been present for one week it came on 2 days after she ran her left ankle into a metal magazine holder.  she does not recall directly recall injuring her right ankle at that time. pain is described as sharp it is worse the first minutes of activity and improves with activity such as walking. It is also worse the first minutes of waking and improves with activity such as household chores . It radiates proximally up the anteromedial shin. Seems to be most bothersome with bearing weight in the fashion above. She denies swelling redness or skin changes the site of discomfort .she denies weakness motor or sensory disturbances in the right extremity. No interventions as of yet , no improvement 2 Goody powder.   Review Of Systems Outlined In HPI  Past Medical History  Diagnosis Date  . Volvulus   . Migraine with visual aura     optical  . Meniere's disease   . HSV (herpes simplex virus) anogenital infection   . Postmenopausal      Family History  Problem Relation Age of Onset  . Depression Sister   . Depression Sister   . Hypertension Mother   . Hypertension Father   . Depression Father   . Glaucoma Father   . Parkinsonism Father      History  Substance Use Topics  . Smoking status: Never Smoker   . Smokeless tobacco: Not on file  . Alcohol Use: 1.8 oz/week    3 Glasses of wine per week     Comment: per week     Objective: Filed Vitals:   01/05/13 0826  BP: 142/84  Pulse: 79    General: Alert and Oriented, No Acute Distress Lungs:clear comfortable work of breathing Cardiac: Regular rate and rhythm. Extremities: No peripheral edema.  Strong peripheral pulses.  right lower extremity has full range of motion strength in the right ankle. Anterior drawer of the ankle is negative. There is no pain with palpation of  the base of the fifth metatarsal nor navicular nor medial or lateral malleoli. Pain is reproduced with resisted dorsiflexion. All other planes of passive and active motion did not cause pain. There is no pain with tuning fork over shin or either malleoli Mental Status: No depression, anxiety, nor agitation. Skin: Warm and dry.  Assessment & Plan: [name] was seen today for rt ankle pain.  Diagnoses and associated orders for this visit:  Tendonitis of ankle, right     discussed with patient my suspicion of tendinitis of the dorsiflexors discussed ibuprofen 800 mg 3 times a day,  alphabet tracing range of motion exercises 3 times a day, and a compression wrap which was applied today to be worn during waking hours. I encouraged her to continue with water aerobics but avoid impact activities  Return in about 2 weeks (around 01/19/2013) for Dr. Kar[facility]wal[facility] Eval if not improved.

## 2013-02-03 ENCOUNTER — Ambulatory Visit (INDEPENDENT_AMBULATORY_CARE_PROVIDER_SITE_OTHER): Payer: BC Managed Care – PPO | Admitting: Family Medicine

## 2013-02-03 ENCOUNTER — Encounter: Payer: Self-pay | Admitting: Family Medicine

## 2013-02-03 ENCOUNTER — Telehealth: Payer: Self-pay | Admitting: *Deleted

## 2013-02-03 VITALS — BP 116/71 | HR 75 | Wt 145.0 lb

## 2013-02-03 DIAGNOSIS — L02419 Cutaneous abscess of limb, unspecified: Secondary | ICD-10-CM

## 2013-02-03 DIAGNOSIS — L03119 Cellulitis of unspecified part of limb: Secondary | ICD-10-CM

## 2013-02-03 DIAGNOSIS — Z23 Encounter for immunization: Secondary | ICD-10-CM

## 2013-02-03 MED ORDER — CEPHALEXIN 500 MG PO CAPS: 500.0000 mg | ORAL_CAPSULE | Freq: Three times a day (TID) | ORAL | Status: DC

## 2013-02-03 MED ORDER — BECLOMETHASONE DIPROPIONATE 80 MCG/ACT IN AERS: 1.0000 | INHALATION_SPRAY | RESPIRATORY_TRACT | Status: DC | PRN

## 2013-02-03 NOTE — Progress Notes (Signed)
  Subjective:    Patient ID: [name], female    DOB: Jun 01, 1950, 62 y.o.   MRN: [id number]  HPI left leg fell about 1 mo ago and injured the right ankle and bruised the left leg didnt start borthering her until 1 wk ago now its red itchy warm to the touch right above her anke. No known PVD. Never smoker. No break in the skin. She did ice for a week.  Sxs worse in the evening.     Review of Systems     Objective:   Physical Exam  Constitutional: She appears well-developed and well-nourished.  HENT:  Head: Normocephalic and atraumatic.  Abdominal: Soft. Bowel sounds are normal.  Musculoskeletal:       Legs: mildy erythematous rash that is inc warmth, mildly swelling adn tender.   Skin: Skin is warm and dry.  Psychiatric: She has a normal mood and affect. Her behavior is normal.          Assessment & Plan:  Left lower leg cellulitis - will tx with keflex. Call if not better by Monday . Call if getting worse or if fever, chills or sweats.

## 2013-02-03 NOTE — Patient Instructions (Signed)
Cellulitis °Cellulitis is an infection of the skin and the tissue under the skin. The infected area is usually red and tender. This happens most often in the arms and lower legs. °HOME CARE  °· Take your antibiotic medicine as told. Finish the medicine even if you start to feel better. °· Keep the infected arm or leg raised (elevated). °· Put a warm cloth on the area up to 4 times per day. °· Only take medicines as told by your doctor. °· Keep all doctor visits as told. °GET HELP RIGHT AWAY IF:  °· You have a fever. °· You feel very sleepy. °· You throw up (vomit) or have watery poop (diarrhea). °· You feel sick and have muscle aches and pains. °· You see red streaks on the skin coming from the infected area. °· Your red area gets bigger or turns a dark color. °· Your bone or joint under the infected area is painful after the skin heals. °· Your infection comes back in the same area or different area. °· You have a puffy (swollen) bump in the infected area. °· You have new symptoms. °MAKE SURE YOU:  °· Understand these instructions. °· Will watch your condition. °· Will get help right away if you are not doing well or get worse. °Document Released: 10/22/2007 Document Revised: 11/04/2011 Document Reviewed: 07/21/2011 °Exit[employer]® Patient Information ©2014 [employer]. ° °

## 2013-02-03 NOTE — Telephone Encounter (Signed)
Yes, okay for water aerobics, since no actual open wound

## 2013-02-03 NOTE — Telephone Encounter (Signed)
Called and informed pt that this would be ok.[name]

## 2013-02-03 NOTE — Telephone Encounter (Signed)
Pt wanted to know if it was ok for her to do water aerobics. Will forward to Dr. Linford Arnold for advice.[name] [place]

## 2013-02-15 ENCOUNTER — Other Ambulatory Visit: Payer: BC Managed Care – PPO

## 2013-02-15 ENCOUNTER — Telehealth: Payer: Self-pay | Admitting: *Deleted

## 2013-02-15 MED ORDER — CEPHALEXIN 500 MG PO CAPS: 500.0000 mg | ORAL_CAPSULE | Freq: Three times a day (TID) | ORAL | Status: DC

## 2013-02-15 NOTE — Telephone Encounter (Signed)
And better, but does not completely gone. I will send over a refill for the Keflex. Recommend taking it for an additional 5 days. If the area is not completely resolved by then did recommend she come back in to have me reevaluate the area.

## 2013-02-15 NOTE — Telephone Encounter (Signed)
Pt seen you on 02/03/13 for cellulitis. States she took the Keflex but the cellulitis is not completely gone. It is still itchy and red and swollen. Please advise.  Meyer Cory, LPN

## 2013-02-16 NOTE — Telephone Encounter (Signed)
Pt informed.  Abigail Siverling, LPN  

## 2013-02-22 ENCOUNTER — Encounter: Payer: Self-pay | Admitting: Family Medicine

## 2013-02-22 ENCOUNTER — Ambulatory Visit (INDEPENDENT_AMBULATORY_CARE_PROVIDER_SITE_OTHER): Payer: BC Managed Care – PPO | Admitting: Family Medicine

## 2013-02-22 VITALS — BP 129/73 | HR 68 | Wt 147.0 lb

## 2013-02-22 DIAGNOSIS — L0390 Cellulitis, unspecified: Secondary | ICD-10-CM

## 2013-02-22 DIAGNOSIS — L0291 Cutaneous abscess, unspecified: Secondary | ICD-10-CM

## 2013-02-22 MED ORDER — MUPIROCIN 2 % EX OINT: TOPICAL_OINTMENT | CUTANEOUS | Status: DC

## 2013-02-22 MED ORDER — SULFAMETHOXAZOLE-TMP DS 800-160 MG PO TABS: 1.0000 | ORAL_TABLET | Freq: Two times a day (BID) | ORAL | Status: DC

## 2013-02-22 NOTE — Patient Instructions (Addendum)
Call if not better by Friday.

## 2013-02-22 NOTE — Progress Notes (Signed)
  Subjective:    Patient ID: [name], female    DOB: [birth year], 62 y.o.   MRN: [id number]  Leg Pain    Recently treated for cellulitis and is better but not recently. No new soaps or lotions.  No contact allergies known.  It is very itchy.  No fever or chills. She felt she was getting some improvement in about not completely resolves she called in for a second round. We refilled the prescription and she is still taking Keflex. She's following up today because the rash is still not completely gone. Again it is improved. She says often it looks better in the morning and then looks a little worse by the end of the day after she's been up on her feet. It still very itchy. She does not have a rash elsewhere.   Review of Systems     Objective:   Physical Exam  Constitutional: She is oriented to person, place, and time. She appears well-developed and well-nourished.  HENT:  Head: Normocephalic and atraumatic.  Neurological: She is alert and oriented to person, place, and time.  Skin: Skin is warm and dry. Rash noted.     Psychiatric: She has a normal mood and affect. Her behavior is normal.    Overall the affected area is at least 50% smaller than what it was originally. It's measuring approximately, 7 cm. There a few excoriations. No satellite lesions. No scale or active drainage.      Assessment & Plan:  Cellulits vs contact dermatitis- Call if nto better by Friday. If not responding then consider topical steroid cream. We'll set switched to Bactrim to cover him for MRSA. We'll also have her apply topical E. Pearson ointment. If not significantly better by Friday then we can try topical steroid cream as it does have the look of a contact dermatitis with some areas that almost look Hive-like.  If still not responding consider biopsy for further evaluation.

## 2013-02-24 ENCOUNTER — Ambulatory Visit
Admission: RE | Admit: 2013-02-24 | Discharge: 2013-02-24 | Disposition: A | Discharge status: Home / Self Care | Payer: BC Managed Care – PPO | Source: Ambulatory Visit | Attending: Family Medicine | Admitting: Family Medicine

## 2013-02-24 DIAGNOSIS — N6320 Unspecified lump in the left breast, unspecified quadrant: Secondary | ICD-10-CM

## 2013-02-25 ENCOUNTER — Telehealth: Payer: Self-pay

## 2013-02-25 MED ORDER — TRIAMCINOLONE ACETONIDE 0.5 % EX OINT: TOPICAL_OINTMENT | Freq: Two times a day (BID) | CUTANEOUS | Status: DC

## 2013-02-25 NOTE — Telephone Encounter (Signed)
Patient advised.

## 2013-02-25 NOTE — Telephone Encounter (Signed)
Ok then I would like to try a topical steroid cream. If not better next week then recommend bx.

## 2013-02-25 NOTE — Telephone Encounter (Signed)
Patient states the cellulitis is not better. She is still on the bactrim. She was advised to call back if not better.

## 2013-03-18 ENCOUNTER — Other Ambulatory Visit: Payer: Self-pay | Admitting: Family Medicine

## 2013-03-31 ENCOUNTER — Telehealth: Payer: Self-pay | Admitting: *Deleted

## 2013-03-31 MED ORDER — GABAPENTIN 100 MG PO CAPS: ORAL_CAPSULE | ORAL | Status: DC

## 2013-03-31 NOTE — Telephone Encounter (Signed)
I will send her a prescription for Neurontin but if she is not getting better she will need to make an appointment.

## 2013-03-31 NOTE — Telephone Encounter (Signed)
Pt called stating that she is in excruciating back pain due to old bulging disk.  She has been taking ibuprofen & trying stretches with no relief.  She wants to know if gabapentin could help.  States that she does not want any narcotics but needs some sort of relief.  She also stated that she is completely unable to come in & see you right now but would be willing to after she got some relief.

## 2013-04-01 NOTE — Telephone Encounter (Signed)
LMOM notifying pt. 

## 2013-04-11 ENCOUNTER — Encounter: Payer: Self-pay | Admitting: Sports Medicine

## 2013-04-11 ENCOUNTER — Ambulatory Visit (INDEPENDENT_AMBULATORY_CARE_PROVIDER_SITE_OTHER): Payer: BC Managed Care – PPO | Admitting: Sports Medicine

## 2013-04-11 VITALS — BP 130/80 | HR 71 | Wt 143.0 lb

## 2013-04-11 DIAGNOSIS — M5416 Radiculopathy, lumbar region: Secondary | ICD-10-CM | POA: Insufficient documentation

## 2013-04-11 DIAGNOSIS — IMO0002 Reserved for concepts with insufficient information to code with codable children: Secondary | ICD-10-CM

## 2013-04-11 NOTE — Assessment & Plan Note (Signed)
Excellent response to gabapentin 200 mg 3 times a day. Likely left L5. Adding physical therapy. I will see her back in one month.

## 2013-04-11 NOTE — Progress Notes (Signed)
Patient ID: [name], female   DOB: Sep 19, 1950, 62 y.o.   MRN: [id number]  Subjective:    CC: Back Pain  HPI:  This is a pleasant 62 y.o with a history of herniated disk . She was on gabapentin On Friday morning, she was reaching for her necklace and she felt a sharp pain on her back. She increased  her gabapentin dose on Saturday to 200 mg twice a day and 300 mg at night. She denies loss of bowel or bladder function. The pain radiates to the left sides of her upper leg. Patient reports improvement of back pain since this morning, it is almost gone. Patient is interested in long term treatment for the herniated disk. She is worried about taking gabapentin for a long time because she reports that it makes her sleepy. She is interested in trying conservative methods for now. Symptoms are mild, persistent.  Past medical history, Surgical history, Family history not pertinant except as noted below, Social history, Allergies, and medications have been entered into the medical record, reviewed, and no changes needed.   Review of Systems: No fevers, chills, night sweats, weight loss, chest pain, or shortness of breath.   Objective:    General: Well Developed, well nourished, and in no acute distress.  Neuro: Alert and oriented x3, extra-ocular muscles intact, sensation grossly intact.  HEENT: Normocephalic, atraumatic, pupils equal round reactive to light, neck supple, no masses, no lymphadenopathy, thyroid nonpalpable.  Skin: Warm and dry, no rashes. Cardiac: Regular rate and rhythm, no murmurs rubs or gallops, no lower extremity edema.  Respiratory: Clear to auscultation bilaterally. Not using accessory muscles, speaking in full sentences. Back Exam:  Inspection: Unremarkable  Motion: Flexion 45 deg, Extension 45 deg, Side Bending to 45 deg bilaterally,  Rotation to 45 deg bilaterally  SLR laying: Negative  XSLR laying: Negative  Palpable tenderness: None. FABER: negative. Sensory  change: Gross sensation intact to all lumbar and sacral dermatomes.  Reflexes: 2+ at both patellar tendons, 2+ at achilles tendons, Babinski's downgoing.  Strength at foot  Plantar-flexion: 5/5 Dorsi-flexion: 5/5 Eversion: 5/5 Inversion: 5/5  Leg strength  Quad: 5/5 Hamstring: 5/5 Hip flexor: 5/5 Hip abductors: 5/5  Gait unremarkable.  Impression and Recommendations:   Herniated disc:  Patient was advised about various treatment and management methods for herniated disc. She opted for a more conservative approach at this time.   We will send her home with exercise handout for herniated disk. -We will also refer her to PT.

## 2013-04-21 ENCOUNTER — Ambulatory Visit: Payer: BC Managed Care – PPO

## 2013-05-02 ENCOUNTER — Other Ambulatory Visit: Payer: Self-pay | Admitting: Family Medicine

## 2013-05-07 ENCOUNTER — Other Ambulatory Visit: Payer: Self-pay | Admitting: Physician Assistant

## 2013-05-09 ENCOUNTER — Ambulatory Visit: Payer: BC Managed Care – PPO | Admitting: Sports Medicine

## 2013-05-09 ENCOUNTER — Ambulatory Visit (INDEPENDENT_AMBULATORY_CARE_PROVIDER_SITE_OTHER): Payer: BC Managed Care – PPO | Admitting: Family Medicine

## 2013-05-09 ENCOUNTER — Encounter: Payer: Self-pay | Admitting: Family Medicine

## 2013-05-09 VITALS — BP 133/83 | HR 80 | Temp 98.1°F | Wt 147.0 lb

## 2013-05-09 DIAGNOSIS — J069 Acute upper respiratory infection, unspecified: Secondary | ICD-10-CM

## 2013-05-09 DIAGNOSIS — R5381 Other malaise: Secondary | ICD-10-CM

## 2013-05-09 LAB — POCT INFLUENZA A/B
Influenza A, POC: NEGATIVE
Influenza B, POC: NEGATIVE

## 2013-05-09 MED ORDER — PREDNISONE 20 MG PO TABS: ORAL_TABLET | ORAL | Status: AC

## 2013-05-09 NOTE — Progress Notes (Signed)
CC: [name] is a 62 y.o. female is here for Sore Throat   Subjective: HPI: Reports acute onset of sore throat, fatigue, cough and subjective chills that began on Saturday night and had not been getting better or worsens onset except for resolution of sore throat.  Accompanied now by thick nasal congestion described as moderate in severity, no interventions as of yet.  Nasal congestion is worse in the evening when lying down flat nothing else makes better or worse. Denies motor or sensory disturbances, confusion, chest pain, wheezing shortness of breath nor lightheadedness. Cough is described as nonproductive present all hours of the day and interfering with sleep.   Review Of Systems Outlined In HPI  Past Medical History  Diagnosis Date  . Volvulus   . Migraine with visual aura     optical  . Meniere's disease   . HSV (herpes simplex virus) anogenital infection   . Postmenopausal      Family History  Problem Relation Age of Onset  . Depression Sister   . Depression Sister   . Hypertension Mother   . Hypertension Father   . Depression Father   . Glaucoma Father   . Parkinsonism Father      History  Substance Use Topics  . Smoking status: Never Smoker   . Smokeless tobacco: Not on file  . Alcohol Use: 1.8 oz/week    3 Glasses of wine per week     Comment: per week     Objective: Filed Vitals:   05/09/13 1059  BP: 133/83  Pulse: 80  Temp: 98.1 F (36.7 C)    General: Alert and Oriented, No Acute Distress HEENT: Pupils equal, round, reactive to light. Conjunctivae clear.  External ears unremarkable, canals clear with intact TMs with appropriate landmarks.  Middle ear appears open without effusion. Erythematous inferior turbinates with moderate mucoid discharge.  Moist mucous membranes, pharynx without inflammation nor lesions.  Neck supple without palpable lymphadenopathy nor abnormal masses. Lungs: Clear to auscultation bilaterally, no  wheezing/ronchi/rales.  Comfortable work of breathing. Good air movement. Cardiac: Regular rate and rhythm. Normal S1/S2.  No murmurs, rubs, nor gallops.   Mental Status: No depression, anxiety, nor agitation. Skin: Warm and dry.  Assessment & Plan: [name] was seen today for sore throat.  Diagnoses and associated orders for this visit:  Other malaise and fatigue - POCT Influenza A/B  Viral URI - predniSONE (DELTASONE) 20 MG tablet; Three tabs at once daily for five days.    Rapid flu test is negative, continue NyQuil and DayQuil for symptom control if wheezing or shortness of breath worsens consider starting prednisone however joint agreement between the 2 of [place] is that she does not need to start it at this time.  Return if symptoms worsen or fail to improve.

## 2013-05-10 ENCOUNTER — Telehealth: Payer: Self-pay | Admitting: *Deleted

## 2013-05-10 MED ORDER — SULFAMETHOXAZOLE-TMP DS 800-160 MG PO TABS: 1.0000 | ORAL_TABLET | Freq: Two times a day (BID) | ORAL | Status: DC

## 2013-05-10 NOTE — Telephone Encounter (Signed)
Does she still have the topical ointment.  If she hits it early with that it may help.  But I will go ahead and send over rx since we will be closed for the Holidays.

## 2013-05-10 NOTE — Telephone Encounter (Signed)
Pt notified med sent and to use topical ointment. Pt agrees. Barry Dienes, LPN

## 2013-05-10 NOTE — Telephone Encounter (Signed)
Pt calls and states she was seen yesterday for flu symptoms and states she has started the prednisone that was given to her and she feels since her immunity is low right now that her cellulitis is flared back up or never completely went away.  States having itchiness, redness and welps just like last time and wants to know if you can call in the Bactrim that you gave her the last time because she does not feel like coming in again. Barry Dienes, LPN

## 2013-06-02 ENCOUNTER — Telehealth: Payer: Self-pay | Admitting: *Deleted

## 2013-06-02 NOTE — Telephone Encounter (Signed)
Pt left message stating that she has a bad cold & it's affecting her asthma.  She's been taking mucinex & that seems to help keep the phlegm loose but she is asking if you could send in a prednisone taper pack for her to use.  She states that she's done that in the past & it's helped a lot. Please advise.

## 2013-06-02 NOTE — Telephone Encounter (Signed)
Really needs appt.  

## 2013-06-03 ENCOUNTER — Ambulatory Visit (INDEPENDENT_AMBULATORY_CARE_PROVIDER_SITE_OTHER): Payer: BC Managed Care – PPO | Admitting: Family Medicine

## 2013-06-03 ENCOUNTER — Encounter: Payer: Self-pay | Admitting: Family Medicine

## 2013-06-03 VITALS — BP 133/73 | HR 69 | Temp 97.4°F | Ht 62.0 in | Wt 147.0 lb

## 2013-06-03 DIAGNOSIS — J45909 Unspecified asthma, uncomplicated: Secondary | ICD-10-CM

## 2013-06-03 DIAGNOSIS — J019 Acute sinusitis, unspecified: Secondary | ICD-10-CM

## 2013-06-03 DIAGNOSIS — J209 Acute bronchitis, unspecified: Secondary | ICD-10-CM

## 2013-06-03 MED ORDER — PREDNISONE 5 MG PO KIT: PACK | ORAL | Status: DC

## 2013-06-03 MED ORDER — ALBUTEROL SULFATE (2.5 MG/3ML) 0.083% IN NEBU
2.5000 mg | INHALATION_SOLUTION | Freq: Once | RESPIRATORY_TRACT | Status: AC
  Administered 2013-06-03: 2.5 mg via RESPIRATORY_TRACT

## 2013-06-03 MED ORDER — AZITHROMYCIN 250 MG PO TABS: ORAL_TABLET | ORAL | Status: DC

## 2013-06-03 NOTE — Progress Notes (Signed)
After neb tx pt's best PF was 270.Audelia Hives [place]

## 2013-06-03 NOTE — Telephone Encounter (Signed)
Pt notified and sent to schedule an appointment. Clemetine Marker, LPN

## 2013-06-03 NOTE — Patient Instructions (Signed)
Make sure using your QVAR twice a day.

## 2013-06-03 NOTE — Progress Notes (Signed)
   Subjective:    Patient ID: [name], female    DOB: 10/28/1950, 63 y.o.   MRN: [id number]  HPI Productive cough x8 days with yellow sputum. She's been mostly using Mucinex. She's had a significantly increased her inhaler use. She had some symptoms around Christmas time and it got a little bit better but then suddenly got worse last Thursday, 8 days ago. No fevers chills or sweats. No sore throat or ear pain. + sinus congstion.  Using her albuterol daily. Feels SOb with activity.  Last used albuterol was around 9:00 this morning.  Using Qvar only once a day.   Review of Systems     Objective:   Physical Exam  Constitutional: She is oriented to person, place, and time. She appears well-developed and well-nourished.  HENT:  Head: Normocephalic and atraumatic.  Right Ear: External ear normal.  Left Ear: External ear normal.  Nose: Nose normal.  Mouth/Throat: Oropharynx is clear and moist.  TMs and canals are clear.   Eyes: Conjunctivae and EOM are normal. Pupils are equal, round, and reactive to light.  Neck: Neck supple. No thyromegaly present.  Cardiovascular: Normal rate, regular rhythm and normal heart sounds.   Pulmonary/Chest: Effort normal. She has wheezes.  Lymphadenopathy:    She has no cervical adenopathy.  Neurological: She is alert and oriented to person, place, and time.  Skin: Skin is warm and dry.  Psychiatric: She has a normal mood and affect.          Assessment & Plan:  Asthma exacerbaion with sinusitis/bronchitis- will tx with zpack and pre-pack ( per her preference).  Yellow zone - peak flow 210. Repeat peak flow after 2 neb treatments was up to 270, still in the yellow zone but improved. Increase Qvar to twice a day. She's only been using it once a day. She needs to do this for at least one month after the current illness is resolved. At that point she's doing really well then she can go back down to once a day. She needs to be a little bit more  liberal about her albuterol use. 2-4 puffs every 4-6 hours as needed. Call if not improved over the weekend.

## 2013-06-08 ENCOUNTER — Other Ambulatory Visit: Payer: Self-pay | Admitting: Family Medicine

## 2013-06-13 ENCOUNTER — Other Ambulatory Visit: Payer: Self-pay | Admitting: Family Medicine

## 2013-06-26 ENCOUNTER — Other Ambulatory Visit: Payer: Self-pay | Admitting: Family Medicine

## 2013-07-30 ENCOUNTER — Other Ambulatory Visit: Payer: Self-pay | Admitting: Family Medicine

## 2013-09-05 ENCOUNTER — Other Ambulatory Visit: Payer: Self-pay | Admitting: Family Medicine

## 2013-09-09 ENCOUNTER — Other Ambulatory Visit: Payer: Self-pay | Admitting: Family Medicine

## 2013-09-09 NOTE — Telephone Encounter (Signed)
Are you ok with filling this?

## 2013-12-07 ENCOUNTER — Other Ambulatory Visit: Payer: Self-pay | Admitting: Family Medicine

## 2014-01-06 ENCOUNTER — Ambulatory Visit (INDEPENDENT_AMBULATORY_CARE_PROVIDER_SITE_OTHER): Payer: BC Managed Care – PPO | Admitting: Family Medicine

## 2014-01-06 ENCOUNTER — Encounter: Payer: Self-pay | Admitting: Family Medicine

## 2014-01-06 VITALS — BP 128/82 | HR 72 | Wt 150.0 lb

## 2014-01-06 DIAGNOSIS — R635 Abnormal weight gain: Secondary | ICD-10-CM

## 2014-01-06 DIAGNOSIS — M7989 Other specified soft tissue disorders: Secondary | ICD-10-CM | POA: Diagnosis not present

## 2014-01-06 DIAGNOSIS — Z0000 Encounter for general adult medical examination without abnormal findings: Secondary | ICD-10-CM

## 2014-01-06 MED ORDER — CITALOPRAM HYDROBROMIDE 20 MG PO TABS: ORAL_TABLET | ORAL | Status: DC

## 2014-01-06 NOTE — Progress Notes (Signed)
Subjective:     [name] is a 63 y.o. female and is here for a comprehensive physical exam. The patient reports problems - LE swelling. worse at the end of day. legs feel heavy.  History   Social History  . Marital Status: Married    Spouse Name: N/A    Number of Children: N/A  . Years of Education: N/A   Occupational History  . leasing agent     [employer]   Social History Main Topics  . Smoking status: Never Smoker   . Smokeless tobacco: Not on file  . Alcohol Use: 1.8 oz/week    3 Glasses of wine per week     Comment: per week  . Drug Use: Not on file  . Sexual Activity: Yes    Partners: Male   Other Topics Concern  . Not on file   Social History Narrative   Working out twice per week.     Health Maintenance  Topic Date Due  . Zostavax  02/11/2011  . Influenza Vaccine  12/17/2013  . Pap Smear  11/25/2014  . Mammogram  02/25/2015  . Colonoscopy  11/15/2015  . Tetanus/tdap  10/19/2016    The following portions of the patient's history were reviewed and updated as appropriate: allergies, current medications, past family history, past medical history, past social history, past surgical history and problem list.  Review of Systems A comprehensive review of systems was negative.   Objective:    BP 128/82  Pulse 72  Wt 150 lb (68.04 kg) General appearance: alert, cooperative and appears stated age Head: Normocephalic, without obvious abnormality, atraumatic Eyes: conj clear, EOMi, PEERLA Ears: normal TM's and external ear canals both ears Nose: Nares normal. Septum midline. Mucosa normal. No drainage or sinus tenderness. Throat: lips, mucosa, and tongue normal; teeth and gums normal Neck: no adenopathy, no carotid bruit, no JVD, supple, symmetrical, trachea midline and thyroid not enlarged, symmetric, no tenderness/mass/nodules Back: symmetric, no curvature. ROM normal. No CVA tenderness. Lungs: clear to auscultation bilaterally Heart: regular rate  and rhythm, S1, S2 normal, no murmur, click, rub or gallop Abdomen: soft, non-tender; bowel sounds normal; no masses,  no organomegaly Extremities: extremities normal, atraumatic, no cyanosis or edema, 1+ pitting edema behind it both ankles bilaterally. Trace edema on the lower shin. Pulses: 2+ and symmetric Skin: Skin color, texture, turgor normal. No rashes or lesions Lymph nodes: Cervical, supraclavicular, and axillary nodes normal. Neurologic: Alert and oriented X 3, normal strength and tone. Normal symmetric reflexes. Normal coordination and gait    Assessment:    Healthy female exam.      Plan:     See After Visit Summary for Counseling Recommendations  Keep up a regular exercise program and make sure you are eating a healthy diet Try to eat 4 servings of dairy a day, or if you are lactose intolerant take a calcium with vitamin D daily.  Your vaccines are up to date.   Lower extremity swelling-will check blood work to rule out thyroid problems the left leg disturbance, abnormal kidney or liver function and anemia. Suspect that this is most likely related to her recent weight gain as well as venous stasis. We discussed the importance of compression stockings.  Abnormal weight gain - I be happy to discuss weight loss medications with her at a future visit. In the meantime we discussed her issues to work on diet and decreasing carbohydrate intake and working on regular exercise. And making healthy choices at the  grocery store.

## 2014-01-12 ENCOUNTER — Other Ambulatory Visit: Payer: Self-pay | Admitting: Family Medicine

## 2014-01-12 LAB — CBC
HEMATOCRIT: 29.2 % — AB (ref 36.0–46.0)
Hemoglobin: 9.4 g/dL — ABNORMAL LOW (ref 12.0–15.0)
MCH: 25.9 pg — ABNORMAL LOW (ref 26.0–34.0)
MCHC: 32.2 g/dL (ref 30.0–36.0)
MCV: 80.4 fL (ref 78.0–100.0)
Platelets: 389 10*3/uL (ref 150–400)
RBC: 3.63 MIL/uL — ABNORMAL LOW (ref 3.87–5.11)
RDW: 14.8 % (ref 11.5–15.5)
WBC: 5.2 10*3/uL (ref 4.0–10.5)

## 2014-01-13 ENCOUNTER — Other Ambulatory Visit: Payer: Self-pay | Admitting: *Deleted

## 2014-01-13 DIAGNOSIS — D509 Iron deficiency anemia, unspecified: Secondary | ICD-10-CM

## 2014-01-13 LAB — COMPLETE METABOLIC PANEL WITH GFR
ALT: 17 U/L (ref 0–35)
AST: 23 U/L (ref 0–37)
Albumin: 4 g/dL (ref 3.5–5.2)
Alkaline Phosphatase: 105 U/L (ref 39–117)
BUN: 12 mg/dL (ref 6–23)
CALCIUM: 8.8 mg/dL (ref 8.4–10.5)
CHLORIDE: 98 meq/L (ref 96–112)
CO2: 21 meq/L (ref 19–32)
Creat: 0.72 mg/dL (ref 0.50–1.10)
Glucose, Bld: 94 mg/dL (ref 70–99)
POTASSIUM: 4.6 meq/L (ref 3.5–5.3)
SODIUM: 134 meq/L — AB (ref 135–145)
TOTAL PROTEIN: 6.3 g/dL (ref 6.0–8.3)
Total Bilirubin: 0.3 mg/dL (ref 0.2–1.2)

## 2014-01-13 LAB — VITAMIN B12: Vitamin B-12: 259 pg/mL (ref 211–911)

## 2014-01-13 LAB — URINALYSIS, ROUTINE W REFLEX MICROSCOPIC
Bilirubin Urine: NEGATIVE
Glucose, UA: NEGATIVE mg/dL
Hgb urine dipstick: NEGATIVE
Ketones, ur: NEGATIVE mg/dL
LEUKOCYTES UA: NEGATIVE
Nitrite: NEGATIVE
PH: 7 (ref 5.0–8.0)
Protein, ur: NEGATIVE mg/dL
SPECIFIC GRAVITY, URINE: 1.011 (ref 1.005–1.030)
UROBILINOGEN UA: 0.2 mg/dL (ref 0.0–1.0)

## 2014-01-13 LAB — FERRITIN: FERRITIN: 4 ng/mL — AB (ref 10–291)

## 2014-01-13 LAB — TSH: TSH: 2.88 u[IU]/mL (ref 0.350–4.500)

## 2014-01-13 LAB — LIPID PANEL
Cholesterol: 233 mg/dL — ABNORMAL HIGH (ref 0–200)
HDL: 106 mg/dL (ref >39)
LDL Cholesterol: 113 mg/dL — ABNORMAL HIGH (ref 0–99)
Total CHOL/HDL Ratio: 2.2 Ratio
Triglycerides: 69 mg/dL (ref <150)
VLDL: 14 mg/dL (ref 0–40)

## 2014-01-13 LAB — FOLATE: Folate: >20 ng/mL

## 2014-01-20 ENCOUNTER — Encounter: Payer: Self-pay | Admitting: Family Medicine

## 2014-01-20 ENCOUNTER — Ambulatory Visit (INDEPENDENT_AMBULATORY_CARE_PROVIDER_SITE_OTHER): Payer: BC Managed Care – PPO | Admitting: Family Medicine

## 2014-01-20 VITALS — BP 139/68 | HR 72 | Wt 148.0 lb

## 2014-01-20 DIAGNOSIS — E663 Overweight: Secondary | ICD-10-CM | POA: Diagnosis not present

## 2014-01-20 DIAGNOSIS — R635 Abnormal weight gain: Secondary | ICD-10-CM

## 2014-01-20 DIAGNOSIS — R21 Rash and other nonspecific skin eruption: Secondary | ICD-10-CM

## 2014-01-20 DIAGNOSIS — E785 Hyperlipidemia, unspecified: Secondary | ICD-10-CM

## 2014-01-20 MED ORDER — PHENTERMINE HCL 15 MG PO CAPS: 15.0000 mg | ORAL_CAPSULE | ORAL | Status: DC

## 2014-01-20 NOTE — Progress Notes (Signed)
   Subjective:    Patient ID: [name], female    DOB: 07-20-1950, 63 y.o.   MRN: [id number]  HPIRash on left lower leg.  Similar rash as had last year. Worried about cellulitis.  Says has been there for 2 weeks.  Has been itching. No fever. She has been scratching at it and now has a scab in the center. No open wounds or drainage there.  Abnormal weight gain She would like to weight 125.  That is what she used to weight and is her idea weight.  She is working out regularly. She's just frustrated because she feels like her weight has plateaued.   Review of Systems     Objective:   Physical Exam  Constitutional: She is oriented to person, place, and time. She appears well-developed and well-nourished.  HENT:  Head: Normocephalic and atraumatic.  Cardiovascular: Normal rate, regular rhythm and normal heart sounds.   Pulmonary/Chest: Effort normal and breath sounds normal.  Neurological: She is alert and oriented to person, place, and time.  Skin: Skin is warm and dry. Rash noted.  She has a mildly erythematous maculopapular rash on the left lower extremity. Some of the lesions are circular and papular and some are more linear. There is a scab of excoriation just over the lower leg.  Psychiatric: She has a normal mood and affect. Her behavior is normal.          Assessment & Plan:  Rash-skin scraping performed to rule out fungal elements. If negative we'll treat with a topical steroid. I do not see any sign of infection at this point that she has been scratching at them and encouraged her to really keep an eye on the scabs to make sure they don't like they're becoming infected over the weekend.  BMI 27 with hyperlipidemia - we discussed different options. Recommended the Smart phone application called my fitness panel. She is Re: working out regularly. We also discussed additional options including a bariatric clinic here in town and possibly a weight loss medication. We  discussed different options. I think she would be a good candidate for phentermine since she does not have any cardiac problems. I explained it is a stimulant medication and how it works. Did warn about potential side effects and to monitor for any palpitations or chest pain. She will need to be monitored in followup once a month for blood pressure and weight checks for this particular medication. She would like to start it. Prescription provided today. She wants to start with a very low dose so we'll start with 15 mg daily.  Time spent 25 minutes, greater than 50% time spent counseling about obesity/BMI 27 and her rash.

## 2014-01-20 NOTE — Addendum Note (Signed)
Addended by: Teddy Spike on: 01/20/2014 05:01 PM   Modules accepted: Orders

## 2014-01-21 LAB — KOH PREP: RESULT - KOH: NONE SEEN

## 2014-01-23 ENCOUNTER — Other Ambulatory Visit: Payer: Self-pay | Admitting: Family Medicine

## 2014-01-23 MED ORDER — CLOBETASOL PROPIONATE 0.05 % EX CREA: 1.0000 "application " | TOPICAL_CREAM | Freq: Every day | CUTANEOUS | Status: DC

## 2014-02-01 ENCOUNTER — Telehealth: Payer: Self-pay | Admitting: Emergency Medicine

## 2014-02-01 MED ORDER — PHENTERMINE-TOPIRAMATE ER 3.75-23 MG PO CP24: 1.0000 | ORAL_CAPSULE | Freq: Every day | ORAL | Status: DC

## 2014-02-01 NOTE — Telephone Encounter (Signed)
Patient called to report weight loss medication prescribed, phentermine,  is causing severe nausea and minor heart palpitations and cannot take; would like to know if another medication is available. Also, states the medication for her rash, Temovate, was too expensive and she did not even take it from pharmacy; wants to know if alternative available.

## 2014-02-01 NOTE — Addendum Note (Signed)
Addended by: Beatrice Lecher D on: 02/01/2014 03:30 PM   Modules accepted: Orders

## 2014-02-01 NOTE — Telephone Encounter (Signed)
[name] will pick up qysimia before weeks end but she would like a cheaper cream for her rash. [name], CMA

## 2014-02-01 NOTE — Telephone Encounter (Signed)
We can try Qsymia. We have coupons for the first 2 weeks for free. That way she can try it for 2 weeks and see which he thinks. Will print prescription and that way she can come pick up the coupon card and prescription.

## 2014-02-02 MED ORDER — TRIAMCINOLONE ACETONIDE 0.5 % EX OINT: 1.0000 "application " | TOPICAL_OINTMENT | Freq: Every day | CUTANEOUS | Status: DC

## 2014-02-02 NOTE — Telephone Encounter (Signed)
New prescription sent for topical triamcinolone.

## 2014-02-02 NOTE — Telephone Encounter (Signed)
Patient aware. Junetta Hearn, CMA 

## 2014-02-02 NOTE — Addendum Note (Signed)
Addended by: Beatrice Lecher D on: 02/02/2014 08:00 AM   Modules accepted: Orders, Medications

## 2014-02-13 ENCOUNTER — Other Ambulatory Visit: Payer: Self-pay

## 2014-02-13 MED ORDER — PHENTERMINE-TOPIRAMATE ER 3.75-23 MG PO CP24: 1.0000 | ORAL_CAPSULE | Freq: Every day | ORAL | Status: DC

## 2014-02-17 ENCOUNTER — Ambulatory Visit: Payer: BC Managed Care – PPO

## 2014-02-22 ENCOUNTER — Emergency Department (INDEPENDENT_AMBULATORY_CARE_PROVIDER_SITE_OTHER): Payer: BC Managed Care – PPO

## 2014-02-22 ENCOUNTER — Emergency Department (INDEPENDENT_AMBULATORY_CARE_PROVIDER_SITE_OTHER)
Admission: EM | Admit: 2014-02-22 | Discharge: 2014-02-22 | Disposition: A | Discharge status: Home / Self Care | Payer: BC Managed Care – PPO | Source: Home / Self Care | Attending: Emergency Medicine | Admitting: Emergency Medicine

## 2014-02-22 ENCOUNTER — Encounter: Payer: Self-pay | Admitting: Emergency Medicine

## 2014-02-22 DIAGNOSIS — S92352A Displaced fracture of fifth metatarsal bone, left foot, initial encounter for closed fracture: Secondary | ICD-10-CM

## 2014-02-22 DIAGNOSIS — S92302A Fracture of unspecified metatarsal bone(s), left foot, initial encounter for closed fracture: Secondary | ICD-10-CM

## 2014-02-22 DIAGNOSIS — W19XXXA Unspecified fall, initial encounter: Secondary | ICD-10-CM

## 2014-02-22 MED ORDER — IBUPROFEN 800 MG PO TABS: 800.0000 mg | ORAL_TABLET | Freq: Three times a day (TID) | ORAL | Status: DC

## 2014-02-22 NOTE — ED Provider Notes (Signed)
CSN: [id number]     Arrival date & time 02/22/14  0820 History   First MD Initiated Contact with Patient 02/22/14 [phone]     Chief Complaint  Patient presents with  . Foot Injury   (Consider location/radiation/quality/duration/timing/severity/associated sxs/prior Treatment) Patient is a 63 y.o. female presenting with foot injury. The history is provided by the patient. No language interpreter was used.  Foot Injury Location:  Foot Time since incident:  3 days Injury: no   Foot location:  L foot Pain details:    Quality:  Aching   Radiates to:  Does not radiate   Severity:  Mild   Progression:  Worsening Chronicity:  New Foreign body present:  No foreign bodies Relieved by:  Nothing Worsened by:  Nothing tried Ineffective treatments:  None tried Associated symptoms: no numbness   Risk factors: no concern for non-accidental trauma     Past Medical History  Diagnosis Date  . Volvulus   . Migraine with visual aura     optical  . Meniere's disease   . HSV (herpes simplex virus) anogenital infection   . Postmenopausal    Past Surgical History  Procedure Laterality Date  . Colon surgery  2003    resection/small bowel ischemia  . Hernia repair    . Ltcs    . Ovarian cyst removal    . Tympanostomy tube placement      placement RT, out now.   . Small intestine surgery  02/2010  . Esophageal dilation     Family History  Problem Relation Age of Onset  . Depression Sister   . Depression Sister   . Hypertension Mother   . Hypertension Father   . Depression Father   . Glaucoma Father   . Parkinsonism Father    History  Substance Use Topics  . Smoking status: Never Smoker   . Smokeless tobacco: Not on file  . Alcohol Use: 1.8 oz/week    3 Glasses of wine per week     Comment: per week   OB History   Grav Para Term Preterm Abortions TAB SAB Ect Mult Living                 Review of Systems  All other systems reviewed and are negative.   Allergies   Codeine  Home Medications   Prior to Admission medications   Medication Sig Start Date End Date Taking? Authorizing Provider  Carbonyl Iron (FEOSOL PO) Take 1 capsule by mouth daily.    Historical Provider, MD  citalopram (CELEXA) 20 MG tablet TAKE 1 TABLET (20 MG TOTAL) BY MOUTH DAILY. 01/06/14   Hali Marry, MD  diazepam (VALIUM) 5 MG tablet Take 1 tablet (5 mg total) by mouth at bedtime as needed. 11/25/11   Hali Marry, MD  montelukast (SINGULAIR) 10 MG tablet TAKE 1 TABLET (10 MG TOTAL) BY MOUTH AT BEDTIME.    Hali Marry, MD  phentermine 15 MG capsule Take 1 capsule (15 mg total) by mouth every morning. Can sub tabs if cheaper 01/20/14   Hali Marry, MD  Phentermine-Topiramate 3.75-23 MG CP24 Take 1 capsule by mouth daily. 02/13/14   Hali Marry, MD  SUMAtriptan (IMITREX) 100 MG tablet Take 1 tablet (100 mg total) by mouth every 2 (two) hours as needed. 11/25/11   Hali Marry, MD  triamcinolone ointment (KENALOG) 0.5 % Apply 1 application topically daily. 02/02/14   Hali Marry, MD  triamterene-hydrochlorothiazide (MAXZIDE) 75-50 MG per  tablet TAKE 1 TABLET BY MOUTH DAILY. 06/08/13   Hali Marry, MD  valACYclovir (VALTREX) 1000 MG tablet Take 1 tablet (1,000 mg total) by mouth 2 (two) times daily. (1/2) tab twice daily. 11/25/11   Hali Marry, MD  VENTOLIN HFA 108 (90 BASE) MCG/ACT inhaler INHALE 2 PUFFS INTO THE LUNGS EVERY 6 (SIX) HOURS AS NEEDED. 09/09/13   Hali Marry, MD   BP 127/83  Pulse 78  Temp(Src) 97.6 F (36.4 C) (Oral)  Ht 5\' 2"  (1.575 m)  Wt 144 lb (65.318 kg)  BMI 26.33 kg/m2  SpO2 100% Physical Exam  Nursing note and vitals reviewed. Constitutional: She is oriented to person, place, and time. She appears well-developed.  HENT:  Head: Normocephalic.  Musculoskeletal: She exhibits tenderness.  Bruised swollen left foot, from  nv and ns intact,  Bruised top and bottom of foot,  Tender 5th  metatarsal.  nv and ns intact  Neurological: She is alert and oriented to person, place, and time. She has normal reflexes.  Skin: Skin is warm.  Psychiatric: She has a normal mood and affect.    ED Course  Procedures (including critical care time) Labs Review Labs Reviewed - No data to display  Imaging Review Dg Foot Complete Left  02/22/2014   CLINICAL DATA:  Left foot pain and bruising.  Fall 3 days ago.  EXAM: LEFT FOOT - COMPLETE 3+ VIEW  COMPARISON:  None.  FINDINGS: Deformity of the left fifth metatarsal with distal metaphyseal transverse cortical irregularity and oblique cortical irregularity compatible with comminuted fracture. Questionable healing response, correlate with history in determining whether there may be a subacute component of the fracture.  No malalignment at the Lisfranc joint. The remaining metatarsals appear intact. No additional acute bony findings.  IMPRESSION: 1. Mildly comminuted fracture of the distal fifth metatarsal with transverse metaphyseal and oblique metadiaphyseal components, and adjacent soft tissue swelling. This is probably all acute given the patient's history, although the morphology makes it difficult to exclude some healing response/subacute component.   Electronically Signed   By: Sherryl Barters M.D.   On: 02/22/2014 08:54     MDM   1. Fracture of 5th metatarsal, left, closed, initial encounter    Ibuprofen Ace/post op shoe AVS See Dr. Darene Lamer for 1 week recheck    Fransico Meadow, PA-C 02/22/14 [phone]

## 2014-02-22 NOTE — ED Notes (Signed)
Left foot injury from fall on Sunday, bruised and swollen

## 2014-02-22 NOTE — Discharge Instructions (Signed)
Metatarsal Fracture, Undisplaced  A metatarsal fracture is a break in the bone(s) of the foot. These are the bones of the foot that connect your toes to the bones of the ankle.  DIAGNOSIS   The diagnoses of these fractures are usually made with X-rays. If there are problems in the forefoot and x-rays are normal a later bone scan will usually make the diagnosis.   TREATMENT AND HOME CARE INSTRUCTIONS  · Treatment may or may not include a cast or walking shoe. When casts are needed the use is usually for short periods of time so as not to slow down healing with muscle wasting (atrophy).  · Activities should be stopped until further advised by your caregiver.  · Wear shoes with adequate shock absorbing capabilities and stiff soles.  · Alternative exercise may be undertaken while waiting for healing. These may include bicycling and swimming, or as your caregiver suggests.  · It is important to keep all follow-up visits or specialty referrals. The failure to keep these appointments could result in improper bone healing and chronic pain or disability.  · Warning: Do not drive a car or operate a motor vehicle until your caregiver specifically tells you it is safe to do so.  IF YOU DO NOT HAVE A CAST OR SPLINT:  · You may walk on your injured foot as tolerated or advised.  · Do not put any weight on your injured foot for as long as directed by your caregiver. Slowly increase the amount of time you walk on the foot as the pain allows or as advised.  · Use crutches until you can bear weight without pain. A gradual increase in weight bearing may help.  · Apply ice to the injury for 15-20 minutes each hour while awake for the first 2 days. Put the ice in a plastic bag and place a towel between the bag of ice and your skin.  · Only take over-the-counter or prescription medicines for pain, discomfort, or fever as directed by your caregiver.  SEEK IMMEDIATE MEDICAL CARE IF:   · Your cast gets damaged or breaks.  · You have  continued severe pain or more swelling than you did before the cast was put on, or the pain is not controlled with medications.  · Your skin or nails below the injury turn blue or grey, or feel cold or numb.  · There is a bad smell, or new stains or pus-like (purulent) drainage coming from the cast.  MAKE SURE YOU:   · Understand these instructions.  · Will watch your condition.  · Will get help right away if you are not doing well or get worse.  Document Released: 01/25/2002 Document Revised: 07/28/2011 Document Reviewed: 12/17/2007  ExitCare® Patient Information ©2015 [employer]. This information is not intended to replace advice given to you by your health care provider. Make sure you discuss any questions you have with your health care provider.

## 2014-02-23 NOTE — ED Provider Notes (Signed)
Medical history/examination/treatment/procedure(s) were performed by non-physician provider and as supervising physician I was immediately available for consultation/collaboration.  Jacqulyn Cane, MD 02/23/14 [phone]

## 2014-02-25 ENCOUNTER — Telehealth: Payer: Self-pay | Admitting: Emergency Medicine

## 2014-02-28 ENCOUNTER — Encounter: Payer: Self-pay | Admitting: Sports Medicine

## 2014-02-28 ENCOUNTER — Ambulatory Visit (INDEPENDENT_AMBULATORY_CARE_PROVIDER_SITE_OTHER): Payer: BC Managed Care – PPO | Admitting: Sports Medicine

## 2014-02-28 VITALS — BP 122/74 | HR 70 | Ht 62.0 in | Wt 145.0 lb

## 2014-02-28 DIAGNOSIS — S92352A Displaced fracture of fifth metatarsal bone, left foot, initial encounter for closed fracture: Secondary | ICD-10-CM

## 2014-02-28 NOTE — Progress Notes (Addendum)
   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 She had immediate pain, swelling, and bruising but did not seek care until several days later. Pain was severe. She was seen in urgent care where x-rays showed a mildly comminuted fracture of the distal fifth metatarsal, she was appropriately placed in a postop shoe and referred to me for further evaluation and definitive treatment. Pain is moderate, persistent, but she declines any pain medication.  Past medical history, Surgical history, Family history not pertinant except as noted below, Social history, Allergies, and medications have been entered into the medical record, reviewed, and no changes needed.   Review of Systems: No headache, visual changes, nausea, vomiting, diarrhea, constipation, dizziness, abdominal pain, skin rash, fevers, chills, night sweats, weight loss, swollen lymph nodes, body aches, joint swelling, muscle aches, chest pain, shortness of breath, mood changes, visual or auditory hallucinations.   Objective:   General: Well Developed, well nourished, and in no acute distress.  Neuro/Psych: Alert and oriented x3, extra-ocular muscles intact, able to move all 4 extremities, sensation grossly intact. Skin: Warm and dry, no rashes noted.  Respiratory: Not using accessory muscles, speaking in full sentences, trachea midline.  Cardiovascular: Pulses palpable, no extremity edema. Abdomen: Does not appear distended. Left Foot: Bruised and swollen. Range of motion is full in all directions. Strength is 5/5 in all directions. No hallux valgus. No pes cavus or pes planus. No abnormal callus noted. No pain over the navicular prominence, or base of fifth metatarsal. No tenderness to palpation of the calcaneal insertion of plantar fascia. No pain at the Achilles  insertion. No pain over the calcaneal bursa. No pain of the retrocalcaneal bursa. Tender to palpation at the distal fifth metatarsal. No hallux rigidus or limitus. No tenderness palpation over interphalangeal joints. No pain with compression of the metatarsal heads. Neurovascularly intact distally.  X-rays reviewed personally and show mild comminution of the distal fifth metatarsal fracture with no angulation and no displacement.  Impression and Recommendations:   This case required medical decision making of moderate complexity.

## 2014-02-28 NOTE — Assessment & Plan Note (Signed)
Compressive dressing, postop shoe. Patient does not desire any pain medication. Return in 2 weeks, x-ray before visit.  I billed a fracture code for this encounter, all subsequent visits will be post-op checks in the global period.

## 2014-03-06 ENCOUNTER — Other Ambulatory Visit: Payer: Self-pay

## 2014-03-06 ENCOUNTER — Ambulatory Visit (INDEPENDENT_AMBULATORY_CARE_PROVIDER_SITE_OTHER): Payer: BC Managed Care – PPO | Admitting: Family Medicine

## 2014-03-06 VITALS — BP 136/77 | HR 88 | Wt 143.0 lb

## 2014-03-06 DIAGNOSIS — R635 Abnormal weight gain: Secondary | ICD-10-CM

## 2014-03-06 DIAGNOSIS — S92352A Displaced fracture of fifth metatarsal bone, left foot, initial encounter for closed fracture: Secondary | ICD-10-CM

## 2014-03-06 MED ORDER — PHENTERMINE-TOPIRAMATE ER 3.75-23 MG PO CP24: 1.0000 | ORAL_CAPSULE | Freq: Every day | ORAL | Status: DC

## 2014-03-06 NOTE — Progress Notes (Signed)
   Subjective:    Patient ID: [name], female    DOB: 04-24-1951, 63 y.o.   MRN: [id number]  HPI  Rida is here for a weight check and blood pressure check. Denies chest pain, shortness of breath or medication problems.    Review of Systems     Objective:   Physical Exam        Assessment & Plan:  Patient has lost 2 lbs from last visit. A prescription of phentermine will be faxed to [facility].   Abnormal weight gain - doing well. F/U in 1 month.  Beatrice Lecher, MD

## 2014-03-08 ENCOUNTER — Other Ambulatory Visit: Payer: Self-pay | Admitting: *Deleted

## 2014-03-08 DIAGNOSIS — D509 Iron deficiency anemia, unspecified: Secondary | ICD-10-CM

## 2014-03-08 LAB — HEMOGLOBIN: Hemoglobin: 10.8 g/dL — ABNORMAL LOW (ref 12.0–15.0)

## 2014-03-08 LAB — RETICULOCYTES
ABS RETIC: 56.9 10*3/uL (ref 19.0–186.0)
RBC.: 4.38 MIL/uL (ref 3.87–5.11)
RETIC CT PCT: 1.3 % (ref 0.4–2.3)

## 2014-03-08 LAB — FERRITIN: Ferritin: 5 ng/mL — ABNORMAL LOW (ref 10–291)

## 2014-03-14 ENCOUNTER — Ambulatory Visit (INDEPENDENT_AMBULATORY_CARE_PROVIDER_SITE_OTHER): Payer: BC Managed Care – PPO

## 2014-03-14 ENCOUNTER — Ambulatory Visit (INDEPENDENT_AMBULATORY_CARE_PROVIDER_SITE_OTHER): Payer: BC Managed Care – PPO | Admitting: Sports Medicine

## 2014-03-14 ENCOUNTER — Encounter: Payer: Self-pay | Admitting: Sports Medicine

## 2014-03-14 VITALS — BP 130/74 | HR 68 | Ht 62.0 in | Wt 145.0 lb

## 2014-03-14 DIAGNOSIS — S92352D Displaced fracture of fifth metatarsal bone, left foot, subsequent encounter for fracture with routine healing: Secondary | ICD-10-CM

## 2014-03-14 NOTE — Progress Notes (Signed)
  Subjective: 2 weeks post fracture of the distal shaft of the fifth metatarsal on the left foot, pain-free.   Objective: General: Well-developed, well-nourished, and in no acute distress. Left foot: Minimally tender to palpation over the fracture site, still swollen, neurovascularly intact distally.  X-rays show slight increased displacement of the fracture however there is good bony callus bridging the fracture with blurring of the fracture lines.  Assessment/plan:

## 2014-03-14 NOTE — Assessment & Plan Note (Signed)
Continue postop shoe for 2 more weeks and return in one month,x-ray before visit.

## 2014-03-20 ENCOUNTER — Telehealth: Payer: Self-pay | Admitting: *Deleted

## 2014-03-20 NOTE — Telephone Encounter (Signed)
Pt reports that she is having trouble filling her rx for qsymia. Pt given following # to ask if she qualifies for discount [phone].[name] [place]

## 2014-03-22 ENCOUNTER — Telehealth: Payer: Self-pay | Admitting: *Deleted

## 2014-03-22 NOTE — Telephone Encounter (Signed)
Qsymia approved. Pharmacy and patient aware. [name], CMA

## 2014-03-28 ENCOUNTER — Telehealth: Payer: Self-pay | Admitting: *Deleted

## 2014-03-28 NOTE — Telephone Encounter (Signed)
Pt appt canceled for 11/19.[name] [place]

## 2014-04-06 ENCOUNTER — Ambulatory Visit: Payer: BC Managed Care – PPO

## 2014-04-10 ENCOUNTER — Encounter: Payer: Self-pay | Admitting: Sports Medicine

## 2014-04-10 ENCOUNTER — Ambulatory Visit (INDEPENDENT_AMBULATORY_CARE_PROVIDER_SITE_OTHER): Payer: BC Managed Care – PPO | Admitting: Sports Medicine

## 2014-04-10 ENCOUNTER — Ambulatory Visit (INDEPENDENT_AMBULATORY_CARE_PROVIDER_SITE_OTHER): Payer: BC Managed Care – PPO

## 2014-04-10 VITALS — BP 129/74 | HR 72 | Wt 142.0 lb

## 2014-04-10 DIAGNOSIS — S92352D Displaced fracture of fifth metatarsal bone, left foot, subsequent encounter for fracture with routine healing: Secondary | ICD-10-CM

## 2014-04-10 NOTE — Assessment & Plan Note (Signed)
Doing much better 4 weeks out from fracture, excellent bony callus bridging the fracture site. Okay to transition into a regular shoe, return to see me in 4 weeks for a final recheck.

## 2014-04-10 NOTE — Progress Notes (Signed)
  Subjective: 4 weeks post fracture of the distal neck of the fifth metatarsal. Doing well.   Objective: General: Well-developed, well-nourished, and in no acute distress. Left foot: No longer tender to palpation over the fracture site, slightly full as expected, neurovascularly intact distally.  X-rays were personally reviewed, there is approximately 1-2 mm of displacement without angulation, this has filled in with bony callus.  Assessment/plan:

## 2014-04-17 ENCOUNTER — Telehealth: Payer: Self-pay | Admitting: *Deleted

## 2014-04-17 NOTE — Telephone Encounter (Signed)
Pt called and wanted to know if she could switch to phentermine tabs and cut them in half

## 2014-04-18 NOTE — Telephone Encounter (Signed)
Pt called and stated that she is down to her last 3 pills. She wanted to know if Dr. Madilyn Fireman would switch her back to the phentermine 15mg  or will she require a office visit for this? Please advise.Maryruth Eve, [place]'

## 2014-04-19 ENCOUNTER — Telehealth: Payer: Self-pay | Admitting: Family Medicine

## 2014-04-19 MED ORDER — PHENTERMINE HCL 15 MG PO CAPS: 15.0000 mg | ORAL_CAPSULE | ORAL | Status: DC

## 2014-04-19 NOTE — Telephone Encounter (Signed)
Since she was just here to see T about a week ago and has lost 2 lbs in November will send in Rx for phentermine.  F/U n in 1 month.

## 2014-04-19 NOTE — Telephone Encounter (Signed)
Patient called and request to know about the phentermine and she request to know if she could have the tablets instead because the capsules are too strong and she can not break them in half. Thanks

## 2014-04-20 ENCOUNTER — Other Ambulatory Visit: Payer: Self-pay

## 2014-04-20 ENCOUNTER — Other Ambulatory Visit: Payer: Self-pay | Admitting: *Deleted

## 2014-04-20 MED ORDER — PHENTERMINE-TOPIRAMATE ER 3.75-23 MG PO CP24: 1.0000 | ORAL_CAPSULE | Freq: Every day | ORAL | Status: DC

## 2014-04-20 MED ORDER — PHENTERMINE-TOPIRAMATE ER 7.5-46 MG PO CP24: 1.0000 | ORAL_CAPSULE | Freq: Every day | ORAL | Status: DC

## 2014-04-20 NOTE — Telephone Encounter (Signed)
I spoke with the pharmacy and the phentermine 15 mg does not come in tablets. What would you recommend. See note below.

## 2014-04-20 NOTE — Telephone Encounter (Signed)
Called and lvm informing pt of med refill and to schedule f/u.[name]

## 2014-04-21 NOTE — Telephone Encounter (Signed)
New Rx sent for higher dose of Qsymia and the phentermine was canceled.

## 2014-04-24 ENCOUNTER — Other Ambulatory Visit: Payer: Self-pay | Admitting: Family Medicine

## 2014-05-15 ENCOUNTER — Telehealth: Payer: Self-pay

## 2014-05-15 ENCOUNTER — Encounter: Payer: Self-pay | Admitting: Sports Medicine

## 2014-05-15 ENCOUNTER — Ambulatory Visit (INDEPENDENT_AMBULATORY_CARE_PROVIDER_SITE_OTHER): Payer: BC Managed Care – PPO | Admitting: Sports Medicine

## 2014-05-15 VITALS — BP 135/73 | HR 71 | Ht 62.0 in | Wt 141.0 lb

## 2014-05-15 DIAGNOSIS — S92352D Displaced fracture of fifth metatarsal bone, left foot, subsequent encounter for fracture with routine healing: Secondary | ICD-10-CM

## 2014-05-15 NOTE — Telephone Encounter (Signed)
Opened inError. [name],CMA

## 2014-05-15 NOTE — Assessment & Plan Note (Signed)
Completely resolved 8 weeks out, return as needed.

## 2014-05-15 NOTE — Progress Notes (Signed)
  Subjective:  8 weeks post fracture of the left fifth metatarsal, completely pain-free.  Objective: General: Well-developed, well-nourished, and in no acute distress. Left Foot: No visible erythema or swelling. Range of motion is full in all directions. Strength is 5/5 in all directions. No hallux valgus. No pes cavus or pes planus. No abnormal callus noted. No pain over the navicular prominence, or base of fifth metatarsal. No tenderness to palpation of the calcaneal insertion of plantar fascia. No pain at the Achilles insertion. No pain over the calcaneal bursa. No pain of the retrocalcaneal bursa. No tenderness to palpation over the tarsals, metatarsals, or phalanges. No hallux rigidus or limitus. No tenderness palpation over interphalangeal joints. No pain with compression of the metatarsal heads. Neurovascularly intact distally.  Assessment/plan:

## 2014-05-16 ENCOUNTER — Telehealth: Payer: Self-pay | Admitting: *Deleted

## 2014-05-16 MED ORDER — PHENTERMINE-TOPIRAMATE ER 7.5-46 MG PO CP24: 1.0000 | ORAL_CAPSULE | Freq: Every day | ORAL | Status: DC

## 2014-05-16 NOTE — Telephone Encounter (Signed)
Pt called requesting refill for qsymia. rx sent. Pt informed.[name]

## 2014-05-26 ENCOUNTER — Ambulatory Visit: Payer: Self-pay | Admitting: Physician Assistant

## 2014-06-02 ENCOUNTER — Other Ambulatory Visit: Payer: Self-pay | Admitting: Family Medicine

## 2014-06-15 ENCOUNTER — Other Ambulatory Visit: Payer: Self-pay

## 2014-06-15 DIAGNOSIS — D509 Iron deficiency anemia, unspecified: Secondary | ICD-10-CM

## 2014-06-15 NOTE — Telephone Encounter (Signed)
I have released and faxed to the lab. Thank you [place].

## 2014-06-15 NOTE — Telephone Encounter (Signed)
Hey [name]. Patient called request to know if she can have future lab orders in her chart sent downstairs to [facility] she will go have labs done tomorrow in the morning before she has her nurses visit at 9:15am. The orders are in there but need to be released. Thanks

## 2014-06-16 ENCOUNTER — Ambulatory Visit (INDEPENDENT_AMBULATORY_CARE_PROVIDER_SITE_OTHER): Payer: BLUE CROSS/BLUE SHIELD | Admitting: Family Medicine

## 2014-06-16 VITALS — BP 123/71 | HR 75 | Wt 141.0 lb

## 2014-06-16 DIAGNOSIS — R635 Abnormal weight gain: Secondary | ICD-10-CM | POA: Diagnosis not present

## 2014-06-16 MED ORDER — PHENTERMINE-TOPIRAMATE ER 7.5-46 MG PO CP24: 1.0000 | ORAL_CAPSULE | Freq: Every day | ORAL | Status: DC

## 2014-06-16 NOTE — Progress Notes (Signed)
   Subjective:    Patient ID: [name], female    DOB: [birth year], 64 y.o.   MRN: [id number]  HPI  [name] is here for blood pressure and weight check. She reports being stuck in the house due to the snow.    Review of Systems     Objective:   Physical Exam        Assessment & Plan:  Abnormal weight gain - Will refill this month but needs to lose at least 2 lbs in the next 4 weeks for [place] to refill it.  Needs to work on exercise.    Beatrice Lecher, MD

## 2014-06-20 NOTE — Telephone Encounter (Signed)
Why don't we try the 15mg  tab and have her cut in half. When start quartering medicine then the amount is extremely variable.

## 2014-06-20 NOTE — Telephone Encounter (Signed)
The cost of Qsymia is over 200 dollars a month. Could she get phentermine 37.5 mg tablets so she can cut into a forth of tablet. Please advise.

## 2014-06-21 ENCOUNTER — Other Ambulatory Visit: Payer: Self-pay

## 2014-06-21 MED ORDER — PHENTERMINE HCL 15 MG PO TBDP: 7.5000 mg | ORAL_TABLET | Freq: Every day | ORAL | Status: DC

## 2014-06-21 MED ORDER — PHENTERMINE HCL 37.5 MG PO CAPS: 9.3750 mg | ORAL_CAPSULE | ORAL | Status: DC

## 2014-06-21 NOTE — Telephone Encounter (Signed)
I printed prescription and will call patient later.

## 2014-07-05 ENCOUNTER — Ambulatory Visit: Payer: BLUE CROSS/BLUE SHIELD | Admitting: Family Medicine

## 2014-07-05 LAB — FERRITIN: Ferritin: 9 ng/mL — ABNORMAL LOW (ref 10–291)

## 2014-07-05 LAB — HEMOGLOBIN: Hemoglobin: 12.1 g/dL (ref 12.0–15.0)

## 2014-07-07 ENCOUNTER — Encounter: Payer: Self-pay | Admitting: Family Medicine

## 2014-07-07 ENCOUNTER — Ambulatory Visit (INDEPENDENT_AMBULATORY_CARE_PROVIDER_SITE_OTHER): Payer: BLUE CROSS/BLUE SHIELD | Admitting: Family Medicine

## 2014-07-07 VITALS — BP 138/74 | HR 71 | Wt 140.0 lb

## 2014-07-07 DIAGNOSIS — R635 Abnormal weight gain: Secondary | ICD-10-CM

## 2014-07-07 DIAGNOSIS — F324 Major depressive disorder, single episode, in partial remission: Secondary | ICD-10-CM | POA: Diagnosis not present

## 2014-07-07 DIAGNOSIS — F325 Major depressive disorder, single episode, in full remission: Secondary | ICD-10-CM

## 2014-07-07 MED ORDER — TRIAMTERENE-HCTZ 75-50 MG PO TABS: 1.0000 | ORAL_TABLET | Freq: Every day | ORAL | Status: DC

## 2014-07-07 MED ORDER — PHENTERMINE HCL 37.5 MG PO TABS: 18.7500 mg | ORAL_TABLET | Freq: Every day | ORAL | Status: DC

## 2014-07-07 NOTE — Progress Notes (Signed)
   Subjective:    Patient ID: [name], female    DOB: [birth year], 64 y.o.   MRN: [id number]  HPI Was taking Qsymia but the price went up.  Currently taking 1/4 of a tab of phentermine.  Took half this am and didn't get nauseated.   She's I lost 1 pound in the last 3 weeks. Her BMI is down to 25.6. She's tolerating medication well without any side effects or problems. She says hasn't been exercising as much but plans on it.    F/U depression -  Doing very well on the medication. Feels completely asymptomatic. Has been on the medication since went through menopause.     Review of Systems     Objective:   Physical Exam  Constitutional: She is oriented to person, place, and time. She appears well-developed and well-nourished.  HENT:  Head: Normocephalic and atraumatic.  Cardiovascular: Normal rate, regular rhythm and normal heart sounds.   Pulmonary/Chest: Effort normal and breath sounds normal.  Neurological: She is alert and oriented to person, place, and time.  Skin: Skin is warm and dry.  Psychiatric: She has a normal mood and affect. Her behavior is normal.          Assessment & Plan:  Abnormal weight gain-has lost 1 lbs.  just want to half a tab daily. Then she'll notice a little bit bigger based on her weight loss. Make sure working very consistently on regular exercise and diet to really maximize her weight loss on this medication. Follow up in one month for blood pressure and weight check with the nurse.  Depression-PHQ 9 score of 0.  Will decrease to 10mg  with the idea that we may try to wean off this year.

## 2014-07-15 ENCOUNTER — Other Ambulatory Visit: Payer: Self-pay | Admitting: Family Medicine

## 2014-07-28 ENCOUNTER — Telehealth: Payer: Self-pay

## 2014-07-28 ENCOUNTER — Ambulatory Visit (INDEPENDENT_AMBULATORY_CARE_PROVIDER_SITE_OTHER): Payer: BLUE CROSS/BLUE SHIELD | Admitting: Family Medicine

## 2014-07-28 VITALS — BP 130/79 | HR 73 | Resp 16 | Wt 141.0 lb

## 2014-07-28 DIAGNOSIS — R519 Headache, unspecified: Secondary | ICD-10-CM

## 2014-07-28 DIAGNOSIS — R51 Headache: Secondary | ICD-10-CM

## 2014-07-28 NOTE — Telephone Encounter (Signed)
Patient scheduled for a nurse visit.  

## 2014-07-28 NOTE — Progress Notes (Deleted)
   Subjective:    Patient ID: [name], female    DOB: July 26, 1950, 64 y.o.   MRN: [id number]  HPI    Review of Systems     Objective:   Physical Exam        Assessment & Plan:

## 2014-07-28 NOTE — Progress Notes (Signed)
   Subjective:    Patient ID: [name], female    DOB: October 31, 1950, 64 y.o.   MRN: [id number]  HPIPatient wonders if BP elevated; has had general headache for 3 days (denies migraine strength); awoke with conjunctival blood vessel redness in left eye (no pain, itching, discharge); has had mild palpitations (no chest pain or shortness of breath). Taking reduced dose of phentermine 18.75mg  po qd.    Review of Systems     Objective:   Physical ExamBP and VS wnl.         Assessment & Plan:  BP well conrolled. Reviewed concerns and VS with Dr.Metheney; told patient she needs to keep well hydrated due to meds; offered D/C phentermine few days to see if issues resolve and patient will consider this. Patient has appointment with Dr. Madilyn Fireman 08-04-2014. P.Deola Rewis, RN

## 2014-08-04 ENCOUNTER — Ambulatory Visit (INDEPENDENT_AMBULATORY_CARE_PROVIDER_SITE_OTHER): Payer: BLUE CROSS/BLUE SHIELD | Admitting: Family Medicine

## 2014-08-04 ENCOUNTER — Encounter: Payer: Self-pay | Admitting: Family Medicine

## 2014-08-04 VITALS — BP 136/75 | HR 76 | Wt 137.0 lb

## 2014-08-04 DIAGNOSIS — H8101 Meniere's disease, right ear: Secondary | ICD-10-CM

## 2014-08-04 DIAGNOSIS — H8109 Meniere's disease, unspecified ear: Secondary | ICD-10-CM | POA: Insufficient documentation

## 2014-08-04 DIAGNOSIS — Z5181 Encounter for therapeutic drug level monitoring: Secondary | ICD-10-CM

## 2014-08-04 DIAGNOSIS — F324 Major depressive disorder, single episode, in partial remission: Secondary | ICD-10-CM

## 2014-08-04 MED ORDER — PHENTERMINE HCL 37.5 MG PO TABS: 18.7500 mg | ORAL_TABLET | Freq: Every day | ORAL | Status: DC

## 2014-08-04 NOTE — Progress Notes (Signed)
   Subjective:    Patient ID: [name], female    DOB: Nov 11, 1950, 64 y.o.   MRN: [id number]  HPI  Follow-up on Mnire's-she has been on the triamterene HCTZ for several years. It keeps her symptoms well controlled.  Abnormal weight gain-she's still taking the phentermine. Taking half a tab daily. She has actually lost 3 pounds in the last few weeks. No CP, palpitations on the half tab.  Not affecting her sleep. Has been going to the gym 2-3 times per week.     Discussed wanting to decrease her citalopram but after 2 day went back up to 20mg  bc has been going throught stressful.   Review of Systems     Objective:   Physical Exam  Constitutional: She is oriented to person, place, and time. She appears well-developed and well-nourished.  HENT:  Head: Normocephalic and atraumatic.  Cardiovascular: Normal rate, regular rhythm and normal heart sounds.   Pulmonary/Chest: Effort normal and breath sounds normal.  Neurological: She is alert and oriented to person, place, and time.  Skin: Skin is warm and dry.  Psychiatric: She has a normal mood and affect. Her behavior is normal.          Assessment & Plan:  Mnire's-doing well on Maxzide. Did discuss that we really should be followed her kidney function twice year just make sure that it safe for her kidneys. Also encouraged her to skip the medication if there's any potential illness causing dehydration such as gastroenteritis. Follow-up in 6 months.  Abnormal weight gain/BMI 25-  Goal weight of 125.  She's doing fantastic on half of a tab. Encouraged her to keep up the exercise which she has now been able to do since her foot fracture has healed. Follow-up in 2 months for blood pressure and weight check with the nurse.  Depression-she plans to continue the 20 mg citalopram for now and may wait until the summer before trying to decrease her dose to 10 mg.

## 2014-08-16 ENCOUNTER — Other Ambulatory Visit: Payer: Self-pay | Admitting: *Deleted

## 2014-08-16 DIAGNOSIS — E876 Hypokalemia: Secondary | ICD-10-CM

## 2014-08-16 LAB — BASIC METABOLIC PANEL
BUN: 15 mg/dL (ref 6–23)
CALCIUM: 9 mg/dL (ref 8.4–10.5)
CO2: 26 mEq/L (ref 19–32)
CREATININE: 0.7 mg/dL (ref 0.50–1.10)
Chloride: 95 mEq/L — ABNORMAL LOW (ref 96–112)
Glucose, Bld: 79 mg/dL (ref 70–99)
Potassium: 4.6 mEq/L (ref 3.5–5.3)
Sodium: 131 mEq/L — ABNORMAL LOW (ref 135–145)

## 2014-08-24 LAB — POTASSIUM: Potassium: 4.4 mEq/L (ref 3.5–5.3)

## 2014-09-06 ENCOUNTER — Telehealth: Payer: Self-pay | Admitting: *Deleted

## 2014-09-06 DIAGNOSIS — E871 Hypo-osmolality and hyponatremia: Secondary | ICD-10-CM

## 2014-09-06 NOTE — Telephone Encounter (Signed)
We actually need to repeat her sodium and chloride. Her potassium was normal but we need to recheck the other 2 values as well because these were off. If those have improved then we may have to consider starting a less potent diuretic than the Maxzide.

## 2014-09-06 NOTE — Telephone Encounter (Signed)
Pt called and wanted to know if she was to stay off of the Maxzide since she did the repeat lab everything was fine and this also helps with her menieres . Will fwd to pcp for advice.[name] [place]

## 2014-09-07 NOTE — Telephone Encounter (Signed)
Pt informed.[name]  

## 2014-09-09 LAB — BASIC METABOLIC PANEL WITH GFR
BUN: 15 mg/dL (ref 6–23)
CO2: 26 mEq/L (ref 19–32)
CREATININE: 0.77 mg/dL (ref 0.50–1.10)
Calcium: 8.9 mg/dL (ref 8.4–10.5)
Chloride: 94 mEq/L — ABNORMAL LOW (ref 96–112)
GFR, Est African American: >89 mL/min
GFR, Est Non African American: 82 mL/min
Glucose, Bld: 89 mg/dL (ref 70–99)
Potassium: 4.4 mEq/L (ref 3.5–5.3)
Sodium: 134 mEq/L — ABNORMAL LOW (ref 135–145)

## 2014-09-11 ENCOUNTER — Other Ambulatory Visit: Payer: Self-pay | Admitting: *Deleted

## 2014-09-11 DIAGNOSIS — E870 Hyperosmolality and hypernatremia: Secondary | ICD-10-CM

## 2014-10-04 ENCOUNTER — Ambulatory Visit: Payer: BLUE CROSS/BLUE SHIELD

## 2014-10-11 ENCOUNTER — Ambulatory Visit (INDEPENDENT_AMBULATORY_CARE_PROVIDER_SITE_OTHER): Payer: BLUE CROSS/BLUE SHIELD | Admitting: Family Medicine

## 2014-10-11 VITALS — BP 138/83 | HR 82 | Ht 62.0 in | Wt 139.0 lb

## 2014-10-11 DIAGNOSIS — R635 Abnormal weight gain: Secondary | ICD-10-CM

## 2014-10-11 NOTE — Progress Notes (Signed)
   Subjective:    Patient ID: [name], female    DOB: August 20, 1950, 64 y.o.   MRN: [id number]  HPI   [name] is here for her blood pressure and weight check. Denies trouble sleeping, palpitations, or medication problems.  Review of Systems     Objective:   Physical Exam        Assessment & Plan:  Patient has not loss weight. A refill for phentermine will not be faxed to pharmacy. Patient advised to schedule a follow up with nurse in 30 days.

## 2014-11-13 ENCOUNTER — Other Ambulatory Visit: Payer: Self-pay | Admitting: Family Medicine

## 2014-11-18 ENCOUNTER — Other Ambulatory Visit: Payer: Self-pay | Admitting: Family Medicine

## 2014-12-26 ENCOUNTER — Ambulatory Visit: Payer: BLUE CROSS/BLUE SHIELD | Admitting: Family Medicine

## 2015-01-07 ENCOUNTER — Other Ambulatory Visit: Payer: Self-pay | Admitting: Family Medicine

## 2015-02-08 ENCOUNTER — Encounter: Payer: Self-pay | Admitting: Family Medicine

## 2015-02-08 ENCOUNTER — Ambulatory Visit (INDEPENDENT_AMBULATORY_CARE_PROVIDER_SITE_OTHER): Payer: BLUE CROSS/BLUE SHIELD | Admitting: Family Medicine

## 2015-02-08 VITALS — BP 126/61 | HR 72 | Ht 62.0 in | Wt 143.0 lb

## 2015-02-08 DIAGNOSIS — J4540 Moderate persistent asthma, uncomplicated: Secondary | ICD-10-CM

## 2015-02-08 DIAGNOSIS — Z23 Encounter for immunization: Secondary | ICD-10-CM

## 2015-02-08 DIAGNOSIS — H8101 Meniere's disease, right ear: Secondary | ICD-10-CM

## 2015-02-08 DIAGNOSIS — F3341 Major depressive disorder, recurrent, in partial remission: Secondary | ICD-10-CM | POA: Diagnosis not present

## 2015-02-08 MED ORDER — FLUTICASONE PROPIONATE HFA 110 MCG/ACT IN AERO: 2.0000 | INHALATION_SPRAY | Freq: Two times a day (BID) | RESPIRATORY_TRACT | Status: DC

## 2015-02-08 MED ORDER — MONTELUKAST SODIUM 10 MG PO TABS: ORAL_TABLET | ORAL | Status: DC

## 2015-02-08 NOTE — Progress Notes (Signed)
Subjective:    Patient ID: [name], female    DOB: 04/15/1951, 64 y.o.   MRN: [id number]  HPI Meniere's dz - taking her diureitc daily.  Says if skips a dose then she notices a difference in her ear symptoms.  Asthma - says ragweek this fall has been bothering her.  Says notices get out of breath when she climbs stairs. She is on her singular. She is usng her albuterol daily for the last 2 weeks. No nightime sxs.  No cough or upper respiratory symptoms.   depression-we'll try decreasing her citalopram back in the spring but she didn't do well and went back up to 20 mg per she wants to continue current regimen.   Review of Systems  BP 126/61 mmHg  Pulse 72  Ht 5\' 2"  (1.575 m)  Wt 143 lb (64.864 kg)  BMI 26.15 kg/m2    Allergies  Allergen Reactions  . Codeine     REACTION: hyper    Past Medical History  Diagnosis Date  . Volvulus   . Migraine with visual aura     optical  . Meniere's disease   . HSV (herpes simplex virus) anogenital infection   . Postmenopausal     Past Surgical History  Procedure Laterality Date  . Colon surgery  2003    resection/small bowel ischemia  . Hernia repair    . Ltcs    . Ovarian cyst removal    . Tympanostomy tube placement      placement RT, out now.   . Small intestine surgery  02/2010  . Esophageal dilation      Social History   Social History  . Marital Status: Married    Spouse Name: N/A  . Number of Children: N/A  . Years of Education: N/A   Occupational History  . leasing agent     [employer]   Social History Main Topics  . Smoking status: Never Smoker   . Smokeless tobacco: Not on file  . Alcohol Use: 1.8 oz/week    3 Glasses of wine per week     Comment: per week  . Drug Use: Not on file  . Sexual Activity:    Partners: Male   Other Topics Concern  . Not on file   Social History Narrative   Working out twice per week.      Family History  Problem Relation Age of Onset  . Depression Sister    . Depression Sister   . Hypertension Mother   . Hypertension Father   . Depression Father   . Glaucoma Father   . Parkinsonism Father     Outpatient Encounter Prescriptions as of 02/08/2015  Medication Sig  . citalopram (CELEXA) 20 MG tablet TAKE 1 TABLET (20 MG TOTAL) BY MOUTH DAILY.  . diazepam (VALIUM) 5 MG tablet Take 1 tablet (5 mg total) by mouth at bedtime as needed.  . fluticasone (FLOVENT HFA) 110 MCG/ACT inhaler Inhale 2 puffs into the lungs 2 (two) times daily.  . montelukast (SINGULAIR) 10 MG tablet TAKE 1 TABLET (10 MG TOTAL) BY MOUTH AT BEDTIME.  . SUMAtriptan (IMITREX) 100 MG tablet Take 1 tablet (100 mg total) by mouth every 2 (two) hours as needed.  . triamcinolone ointment (KENALOG) 0.5 % Apply 1 application topically daily.  Marland Kitchen triamterene-hydrochlorothiazide (MAXZIDE) 75-50 MG per tablet TAKE 1 TABLET BY MOUTH DAILY. Due for PCP follow up in 01/2015  . valACYclovir (VALTREX) 1000 MG tablet TAKE 1 TABLET (1,000 MG  TOTAL) BY MOUTH 2 (TWO) TIMES DAILY. (1/2) TAB TWICE DAILY.  . VENTOLIN HFA 108 (90 BASE) MCG/ACT inhaler INHALE 2 PUFFS INTO THE LUNGS EVERY 6 (SIX) HOURS AS NEEDED.  . [DISCONTINUED] montelukast (SINGULAIR) 10 MG tablet TAKE 1 TABLET (10 MG TOTAL) BY MOUTH AT BEDTIME.  . [DISCONTINUED] phentermine (ADIPEX-P) 37.5 MG tablet Take 0.5 tablets (18.75 mg total) by mouth daily before breakfast.  . [DISCONTINUED] VENTOLIN HFA 108 (90 BASE) MCG/ACT inhaler INHALE 2 PUFFS INTO THE LUNGS EVERY 6 (SIX) HOURS AS NEEDED.   No facility-administered encounter medications on file as of 02/08/2015.          Objective:   Physical Exam  Constitutional: She is oriented to person, place, and time. She appears well-developed and well-nourished.  HENT:  Head: Normocephalic and atraumatic.  Cardiovascular: Normal rate, regular rhythm and normal heart sounds.   Pulmonary/Chest: Effort normal and breath sounds normal.  Neurological: She is alert and oriented to person, place,  and time.  Skin: Skin is warm and dry.  Psychiatric: She has a normal mood and affect. Her behavior is normal.          Assessment & Plan:  Asthma -  Uncontrolled. Moderate persistent area did we'll add inhaled corticosteroid. She has used Flovent in the past and did well with it. New prescriptions sent to pharmacy. If she is doing well and using her albuterol infrequently after about 2-3 months and she can discontinue the Flovent. She says that sometimes winter does trigger her symptoms so I did give her enough refills to get her through the winter. I'll see her back in 6 months. Continue Singulair as well.  Meniere's DZ -  Continue Maxzide. Follow-up in 6 months.   Depression-stable. Continue current regimen. Follow up in 6 months.

## 2015-02-09 ENCOUNTER — Telehealth: Payer: Self-pay | Admitting: *Deleted

## 2015-02-16 NOTE — Telephone Encounter (Signed)
Error

## 2015-03-07 ENCOUNTER — Telehealth: Payer: Self-pay

## 2015-03-07 NOTE — Telephone Encounter (Signed)
Patient states she cannot afford the Advair. She would like something cheaper. She is having to [place] her rescue inhaler more than normal.

## 2015-03-08 NOTE — Telephone Encounter (Signed)
She has 4 choices: Advair, Stan Head, Symbicort. They are all branded. She will need to call her insurance to see which is better tier and thus cheaper.   Beatrice Lecher, MD

## 2015-03-12 NOTE — Telephone Encounter (Signed)
Left message advising of recommendations.  

## 2015-04-11 ENCOUNTER — Other Ambulatory Visit: Payer: Self-pay | Admitting: Family Medicine

## 2015-05-25 ENCOUNTER — Telehealth: Payer: Self-pay | Admitting: Family Medicine

## 2015-05-25 DIAGNOSIS — R928 Other abnormal and inconclusive findings on diagnostic imaging of breast: Secondary | ICD-10-CM

## 2015-05-25 NOTE — Telephone Encounter (Signed)
Dr Madilyn Fireman,  Please put in an Order for patient to have a Bilateral Diagnostic done. She never followed up in getting one done previously  due to high deductible insurance. Thanks.

## 2015-05-28 NOTE — Telephone Encounter (Signed)
Order placed. They should call her to schedule.

## 2015-06-01 NOTE — Telephone Encounter (Signed)
Spoke with pt to expect a call to schedule her mammogram.  If she has any issues or concerns to give [place] a call

## 2015-07-06 ENCOUNTER — Other Ambulatory Visit: Payer: Self-pay | Admitting: Family Medicine

## 2015-08-09 ENCOUNTER — Ambulatory Visit: Payer: BLUE CROSS/BLUE SHIELD | Admitting: Family Medicine

## 2015-10-29 ENCOUNTER — Other Ambulatory Visit: Payer: Self-pay | Admitting: Family Medicine

## 2015-12-27 ENCOUNTER — Other Ambulatory Visit: Payer: Self-pay | Admitting: Family Medicine

## 2016-02-01 ENCOUNTER — Telehealth: Payer: Self-pay

## 2016-02-01 DIAGNOSIS — R790 Abnormal level of blood mineral: Secondary | ICD-10-CM

## 2016-02-01 DIAGNOSIS — E785 Hyperlipidemia, unspecified: Secondary | ICD-10-CM

## 2016-02-01 DIAGNOSIS — Z1329 Encounter for screening for other suspected endocrine disorder: Secondary | ICD-10-CM

## 2016-02-01 DIAGNOSIS — J4540 Moderate persistent asthma, uncomplicated: Secondary | ICD-10-CM

## 2016-02-01 DIAGNOSIS — D649 Anemia, unspecified: Secondary | ICD-10-CM

## 2016-02-01 NOTE — Telephone Encounter (Signed)
[name] would like to have fasting lab work before her 72. What labs would you like me to order?

## 2016-02-04 ENCOUNTER — Other Ambulatory Visit: Payer: Self-pay | Admitting: Family Medicine

## 2016-02-04 NOTE — Telephone Encounter (Signed)
CMP, lipid, CBC, ferritin, TSH.    Abigail Lecher, MD

## 2016-02-06 NOTE — Telephone Encounter (Signed)
Labs ordered. Left message advising patient.

## 2016-02-08 ENCOUNTER — Encounter: Payer: BLUE CROSS/BLUE SHIELD | Admitting: Family Medicine

## 2016-02-20 DIAGNOSIS — E785 Hyperlipidemia, unspecified: Secondary | ICD-10-CM | POA: Diagnosis not present

## 2016-02-20 DIAGNOSIS — J4540 Moderate persistent asthma, uncomplicated: Secondary | ICD-10-CM | POA: Diagnosis not present

## 2016-02-20 DIAGNOSIS — R790 Abnormal level of blood mineral: Secondary | ICD-10-CM | POA: Diagnosis not present

## 2016-02-20 DIAGNOSIS — D649 Anemia, unspecified: Secondary | ICD-10-CM | POA: Diagnosis not present

## 2016-02-20 DIAGNOSIS — Z1329 Encounter for screening for other suspected endocrine disorder: Secondary | ICD-10-CM | POA: Diagnosis not present

## 2016-02-20 LAB — CBC WITH DIFFERENTIAL/PLATELET
BASOS PCT: 1 %
Basophils Absolute: 42 cells/uL (ref 0–200)
EOS PCT: 4 %
Eosinophils Absolute: 168 cells/uL (ref 15–500)
HEMATOCRIT: 30.5 % — AB (ref 35.0–45.0)
Hemoglobin: 8.9 g/dL — ABNORMAL LOW (ref 11.7–15.5)
LYMPHS PCT: 21 %
Lymphs Abs: 882 cells/uL (ref 850–3900)
MCH: 22.4 pg — AB (ref 27.0–33.0)
MCHC: 29.2 g/dL — AB (ref 32.0–36.0)
MCV: 76.8 fL — ABNORMAL LOW (ref 80.0–100.0)
MPV: 8.4 fL (ref 7.5–12.5)
Monocytes Absolute: 462 cells/uL (ref 200–950)
Monocytes Relative: 11 %
Neutro Abs: 2646 cells/uL (ref 1500–7800)
Neutrophils Relative %: 63 %
Platelets: 416 10*3/uL — ABNORMAL HIGH (ref 140–400)
RBC: 3.97 MIL/uL (ref 3.80–5.10)
RDW: 16.3 % — ABNORMAL HIGH (ref 11.0–15.0)
WBC: 4.2 10*3/uL (ref 3.8–10.8)

## 2016-02-21 LAB — LIPID PANEL
Cholesterol: 236 mg/dL — ABNORMAL HIGH (ref 125–200)
HDL: 116 mg/dL (ref >=46)
LDL CALC: 106 mg/dL (ref <130)
Total CHOL/HDL Ratio: 2 Ratio (ref <=5.0)
Triglycerides: 69 mg/dL (ref <150)
VLDL: 14 mg/dL (ref <30)

## 2016-02-21 LAB — FERRITIN: FERRITIN: 5 ng/mL — AB (ref 20–288)

## 2016-02-21 LAB — TSH: TSH: 2.37 mIU/L

## 2016-02-26 ENCOUNTER — Encounter: Payer: Self-pay | Admitting: Family Medicine

## 2016-02-26 ENCOUNTER — Other Ambulatory Visit (HOSPITAL_COMMUNITY)
Admission: RE | Admit: 2016-02-26 | Discharge: 2016-02-26 | Disposition: A | Discharge status: Home / Self Care | Payer: Medicare Other | Source: Ambulatory Visit | Attending: Family Medicine | Admitting: Family Medicine

## 2016-02-26 ENCOUNTER — Ambulatory Visit (INDEPENDENT_AMBULATORY_CARE_PROVIDER_SITE_OTHER): Payer: Medicare Other | Admitting: Family Medicine

## 2016-02-26 VITALS — BP 132/72 | HR 72 | Ht 62.0 in | Wt 149.0 lb

## 2016-02-26 DIAGNOSIS — Z124 Encounter for screening for malignant neoplasm of cervix: Secondary | ICD-10-CM

## 2016-02-26 DIAGNOSIS — Z23 Encounter for immunization: Secondary | ICD-10-CM

## 2016-02-26 DIAGNOSIS — N898 Other specified noninflammatory disorders of vagina: Secondary | ICD-10-CM

## 2016-02-26 DIAGNOSIS — R9431 Abnormal electrocardiogram [ECG] [EKG]: Secondary | ICD-10-CM

## 2016-02-26 DIAGNOSIS — Z0000 Encounter for general adult medical examination without abnormal findings: Secondary | ICD-10-CM | POA: Diagnosis not present

## 2016-02-26 DIAGNOSIS — R21 Rash and other nonspecific skin eruption: Secondary | ICD-10-CM

## 2016-02-26 DIAGNOSIS — Z780 Asymptomatic menopausal state: Secondary | ICD-10-CM

## 2016-02-26 MED ORDER — ESTRADIOL 0.1 MG/GM VA CREA: 1.0000 | TOPICAL_CREAM | Freq: Every day | VAGINAL | 5 refills | Status: DC

## 2016-02-26 MED ORDER — NYSTATIN-TRIAMCINOLONE 100000-0.1 UNIT/GM-% EX OINT: 1.0000 "application " | TOPICAL_OINTMENT | Freq: Two times a day (BID) | CUTANEOUS | 0 refills | Status: DC

## 2016-02-26 NOTE — Addendum Note (Signed)
Addended by: Teddy Spike on: 02/26/2016 11:33 AM   Modules accepted: Orders

## 2016-02-26 NOTE — Progress Notes (Addendum)
Subjective:   [name] is a 65 y.o. female who presents for an Initial Medicare Annual Wellness Visit.  She does complain of dry itchy nipples for one year. She is due for her mammogram. She said she's tried several things such as [place] and moisturizing. She says sometimes it gets a little better for a while but then starts to get worse again. She's noticed that the pigmentation on her left nipple has started to fade.  She doesn't like to have something for vaginal dryness. She tried Premarin previously but it caused headaches.  Review of Systems    Comprehensive ROS is negative.    Physical Exam  Constitutional: She is oriented to person, place, and time. She appears well-developed and well-nourished.  HENT:  Head: Normocephalic and atraumatic.  Right Ear: External ear normal.  Left Ear: External ear normal.  Nose: Nose normal.  Mouth/Throat: Oropharynx is clear and moist.  TMs and canals are clear.   Eyes: Conjunctivae and EOM are normal. Pupils are equal, round, and reactive to light.  Neck: Neck supple. No thyromegaly present.  Cardiovascular: Normal rate, regular rhythm and normal heart sounds.   Pulmonary/Chest: Effort normal and breath sounds normal. She has no wheezes.  Abdominal: Bowel sounds are normal. She exhibits no distension and no mass. There is no tenderness. There is no rebound and no guarding. No hernia.  Genitourinary: Vagina normal and uterus normal. There is no rash or lesion on the right labia. There is no rash or lesion on the left labia. Cervix exhibits no motion tenderness, no discharge and no friability. Right adnexum displays no mass, no tenderness and no fullness. Left adnexum displays no mass, no tenderness and no fullness.  Lymphadenopathy:    She has no cervical adenopathy.  Neurological: She is alert and oriented to person, place, and time.  Skin: Skin is warm and dry.  Psychiatric: She has a normal mood and affect.        Objective:    Today's Vitals   02/26/16 0929  BP: 132/72  Pulse: 72  SpO2: 99%  Weight: 149 lb (67.6 kg)  Height: 5\' 2"  (1.575 m)   Body mass index is 27.25 kg/m.   Current Medications (verified) Outpatient Encounter Prescriptions as of 02/26/2016  Medication Sig  . citalopram (CELEXA) 20 MG tablet TAKE 1 TABLET (20 MG TOTAL) BY MOUTH DAILY.  Marland Kitchen estradiol (ESTRACE VAGINAL) 0.1 MG/GM vaginal cream Place 1 Applicatorful vaginally at bedtime.  . montelukast (SINGULAIR) 10 MG tablet TAKE 1 TABLET (10 MG TOTAL) BY MOUTH AT BEDTIME.  Marland Kitchen nystatin-triamcinolone ointment (MYCOLOG) Apply 1 application topically 2 (two) times daily.  . SUMAtriptan (IMITREX) 100 MG tablet Take 1 tablet (100 mg total) by mouth every 2 (two) hours as needed.  . triamterene-hydrochlorothiazide (MAXZIDE) 75-50 MG tablet TAKE 1 TABLET BY MOUTH DAILY. DUE FOR PCP FOLLOW UP IN 01/2015  . valACYclovir (VALTREX) 1000 MG tablet TAKE 1 TABLET (1,000 MG TOTAL) BY MOUTH 2 (TWO) TIMES DAILY. (1/2) TAB TWICE DAILY.  . VENTOLIN HFA 108 (90 BASE) MCG/ACT inhaler INHALE 2 PUFFS INTO THE LUNGS EVERY 6 (SIX) HOURS AS NEEDED.  . [DISCONTINUED] diazepam (VALIUM) 5 MG tablet Take 1 tablet (5 mg total) by mouth at bedtime as needed.  . [DISCONTINUED] fluticasone (FLOVENT HFA) 110 MCG/ACT inhaler Inhale 2 puffs into the lungs 2 (two) times daily.  . [DISCONTINUED] triamcinolone ointment (KENALOG) 0.5 % Apply 1 application topically daily.  . [DISCONTINUED] VENTOLIN HFA 108 (90 BASE) MCG/ACT  inhaler INHALE 2 PUFFS INTO THE LUNGS EVERY 6 (SIX) HOURS AS NEEDED.   No facility-administered encounter medications on file as of 02/26/2016.     Allergies (verified) Codeine   History: Past Medical History:  Diagnosis Date  . HSV (herpes simplex virus) anogenital infection   . Meniere's disease   . Migraine with visual aura    optical  . Postmenopausal   . Volvulus    Past Surgical History:  Procedure Laterality Date  . COLON SURGERY   2003   resection/small bowel ischemia  . ESOPHAGEAL DILATION    . HERNIA REPAIR    . LTCS    . OVARIAN CYST REMOVAL    . SMALL INTESTINE SURGERY  02/2010  . TYMPANOSTOMY TUBE PLACEMENT     placement RT, out now.    Family History  Problem Relation Age of Onset  . Depression Sister   . Depression Sister   . Hypertension Mother   . Hypertension Father   . Depression Father   . Glaucoma Father   . Parkinsonism Father    Social History   Occupational History  . leasing agent     [name]   Social History Main Topics  . Smoking status: Never Smoker  . Smokeless tobacco: Not on file  . Alcohol use 1.8 oz/week    3 Glasses of wine per week     Comment: per week  . Drug use: Unknown  . Sexual activity: Yes    Partners: Male    Tobacco Counseling Counseling given: Not Answered   Activities of Daily Living No flowsheet data found.  Immunizations and Health Maintenance Immunization History  Administered Date(s) Administered  . Influenza Split 06/08/2012  . Influenza Whole 05/20/2004, 04/09/2007, 02/17/2008, 02/19/2009, 03/06/2010  . Influenza,inj,Quad PF,36+ Mos 02/03/2013, 01/17/2014, 02/08/2015, 02/26/2016  . Pneumococcal Polysaccharide-23 08/29/2009  . Td 10/20/2006   Health Maintenance Due  Topic Date Due  . Hepatitis C Screening  [birth year]  . HIV Screening  02/10/1966  . ZOSTAVAX  02/11/2011  . PAP SMEAR  11/25/2014  . MAMMOGRAM  02/25/2015  . COLONOSCOPY  11/15/2015  . INFLUENZA VACCINE  12/18/2015  . DEXA SCAN  02/11/2016  . PNA vac Low Risk Adult (1 of 2 - PCV13) 02/11/2016    Patient Care Team: Abigail Marry, MD as PCP - General (Family Medicine)  Indicate any recent Medical Services you may have received from other than [facility] providers in the past year (date may be approximate).     Assessment:   This is a routine wellness examination for [name].   Hearing/Vision screen  Visual Acuity Screening   Right eye Left eye Both eyes   Without correction:     With correction: 20/13 20/30 20/13     Dietary issues and exercise activities discussed: Current Exercise Habits: Home exercise routine, Type of exercise: walking;Other - see comments (cycline), Intensity: Moderate  Goals    None     Depression Screen PHQ 2/9 Scores 02/26/2016  PHQ - 2 Score 0    Fall Risk Fall Risk  02/26/2016  Falls in the past year? Yes  Number falls in past yr: 1  Injury with Fall? No  Risk for fall due to : Other (Comment)    Cognitive Function: No flowsheet data found.  Screening Tests Health Maintenance  Topic Date Due  . Hepatitis C Screening  April 19, 1951  . HIV Screening  02/10/1966  . ZOSTAVAX  02/11/2011  . PAP SMEAR  11/25/2014  . MAMMOGRAM  02/25/2015  .  COLONOSCOPY  11/15/2015  . INFLUENZA VACCINE  12/18/2015  . DEXA SCAN  02/11/2016  . PNA vac Low Risk Adult (1 of 2 - PCV13) 02/11/2016  . TETANUS/TDAP  10/19/2016      Plan:   Medicare Wellness Exam   During the course of the visit, [name] was educated and counseled about the following appropriate screening and preventive services:   Vaccines to include Pneumoccal, Influenza, Hepatitis B, Td, Zostavax, HCV  Electrocardiogram - EKG shows rate of 64 bpm, NSR, with left axis deviation. No acute ST-T wave changes.  Will schedule an Echo.   Cardiovascular disease screening  Colorectal cancer screening - due for repeat colonoscopy. She has reached out to [facility] to schedule.   Bone density screening  Diabetes screening  Glaucoma screening  Mammography/PAP - pap done today. Mammo to schedule.    Nutrition counseling  Vaginal dryness - will try estradiol cream. Premarin gave her headaches.   Dry itchy nipples.  Apply nystatin/triamcinolone cream BID. Dry encouraged her to schedule her mammogram as well.  Patient Instructions (the written plan) were given to the patient.    Evett Kassa,Rudene, MD   02/26/2016

## 2016-02-28 LAB — CYTOLOGY - PAP

## 2016-02-29 ENCOUNTER — Telehealth: Payer: Self-pay | Admitting: *Deleted

## 2016-02-29 LAB — FUNGAL STAIN

## 2016-02-29 NOTE — Telephone Encounter (Signed)
Pt reports that the premarin causes headaches. I informed her of the echo and that it is being looked into.[name]

## 2016-02-29 NOTE — Telephone Encounter (Signed)
See if [facility] with Premarin instead of vaginal estrace cream. Will likely be cheaper.  Also not sure about echo.  I ordered it 3 days ago so she should have heard.

## 2016-02-29 NOTE — Telephone Encounter (Signed)
Pt lvm stating that she has not heard anything about her Echocardiogram and wanted to know what was going on with this.   Pt also wanted Dr. Madilyn Fireman to know that the estradiol cream is $300. She did not pick this up.[name] [place]

## 2016-03-04 NOTE — Telephone Encounter (Signed)
We could try a pill that is taken by mouth called Osphena.  It is newer.   It is branded so no sure about insurance .

## 2016-03-05 MED ORDER — OSPEMIFENE 60 MG PO TABS: 60.0000 mg | ORAL_TABLET | Freq: Every day | ORAL | 5 refills | Status: DC

## 2016-03-05 NOTE — Telephone Encounter (Signed)
Pt informed. She said she will try it and let you know if its too expensive.[name], [place]

## 2016-03-05 NOTE — Telephone Encounter (Signed)
Rx sent 

## 2016-03-05 NOTE — Addendum Note (Signed)
Addended by: Beatrice Lecher D on: 03/05/2016 10:16 PM   Modules accepted: Orders

## 2016-03-16 ENCOUNTER — Emergency Department (INDEPENDENT_AMBULATORY_CARE_PROVIDER_SITE_OTHER)
Admission: EM | Admit: 2016-03-16 | Discharge: 2016-03-16 | Disposition: A | Discharge status: Home / Self Care | Payer: Medicare Other | Source: Home / Self Care | Attending: Family Medicine | Admitting: Family Medicine

## 2016-03-16 ENCOUNTER — Encounter: Payer: Self-pay | Admitting: Emergency Medicine

## 2016-03-16 ENCOUNTER — Emergency Department (INDEPENDENT_AMBULATORY_CARE_PROVIDER_SITE_OTHER): Payer: Medicare Other

## 2016-03-16 DIAGNOSIS — IMO0001 Reserved for inherently not codable concepts without codable children: Secondary | ICD-10-CM

## 2016-03-16 DIAGNOSIS — M25571 Pain in right ankle and joints of right foot: Secondary | ICD-10-CM | POA: Diagnosis not present

## 2016-03-16 DIAGNOSIS — S8002XA Contusion of left knee, initial encounter: Secondary | ICD-10-CM

## 2016-03-16 DIAGNOSIS — M25471 Effusion, right ankle: Secondary | ICD-10-CM

## 2016-03-16 DIAGNOSIS — M7989 Other specified soft tissue disorders: Secondary | ICD-10-CM | POA: Diagnosis not present

## 2016-03-16 DIAGNOSIS — S93401A Sprain of unspecified ligament of right ankle, initial encounter: Secondary | ICD-10-CM

## 2016-03-16 NOTE — ED Triage Notes (Signed)
Pt c/o right ankle pain from fall yesterday.  Pt is able to walk on ankle some swelling.

## 2016-03-16 NOTE — ED Provider Notes (Signed)
[name]    CSN: [id number] Arrival date & time: 03/16/16  1403     History   Chief Complaint Chief Complaint  Patient presents with  . Ankle Pain    HPI [name] is a 65 y.o. female.   Patient missed a stair step yesterday, falling on her left knee and twisting her right ankle.   The history is provided by the patient.  Ankle Pain  Location:  Ankle Time since incident:  1 day Injury: yes   Mechanism of injury: fall   Fall:    Fall occurred:  Down stairs   Impact surface:  Hard floor   Point of impact:  Knees Ankle location:  R ankle Pain details:    Quality:  Aching   Radiates to:  Does not radiate   Severity:  Moderate   Onset quality:  Sudden   Duration:  1 day   Timing:  Constant   Progression:  Unchanged Chronicity:  New Dislocation: no   Prior injury to area:  No Relieved by:  None tried Worsened by:  Bearing weight Ineffective treatments:  None tried Associated symptoms: decreased ROM, stiffness and swelling   Associated symptoms: no back pain, no muscle weakness, no numbness and no tingling     Past Medical History:  Diagnosis Date  . HSV (herpes simplex virus) anogenital infection   . Meniere's disease   . Migraine with visual aura    optical  . Postmenopausal   . Volvulus ([place])     Patient Active Problem List   Diagnosis Date Noted  . Meniere disease 08/04/2014  . Left lumbar radiculitis 04/11/2013  . Gastric ulcer 02/10/2011  . Hiatal hernia 02/07/2011  . OSTEOPENIA 12/02/2006  . HYPERLIPIDEMIA 10/20/2006  . HSV 06/16/2006  . Major depressive disorder, recurrent episode ([place]) 02/24/2006  . MIGRAINE, UNSPEC., W/O INTRACTABLE MIGRAINE 02/24/2006  . Asthma, moderate persistent 02/24/2006  . MENOPAUSAL SYNDROME 02/24/2006    Past Surgical History:  Procedure Laterality Date  . COLON SURGERY  2003   resection/small bowel ischemia  . ESOPHAGEAL DILATION    . HERNIA REPAIR    . LTCS    . OVARIAN CYST  REMOVAL    . SMALL INTESTINE SURGERY  02/2010  . TYMPANOSTOMY TUBE PLACEMENT     placement RT, out now.     OB History    No data available       Home Medications    Prior to Admission medications   Medication Sig Start Date End Date Taking? Authorizing Provider  citalopram (CELEXA) 20 MG tablet TAKE 1 TABLET (20 MG TOTAL) BY MOUTH DAILY. 12/27/15   Hali Marry, MD  montelukast (SINGULAIR) 10 MG tablet TAKE 1 TABLET (10 MG TOTAL) BY MOUTH AT BEDTIME. 10/29/15   Hali Marry, MD  nystatin-triamcinolone ointment [facility]) Apply 1 application topically 2 (two) times daily. 02/26/16   Hali Marry, MD  Ospemifene (OSPHENA) 60 MG TABS Take 60 mg by mouth daily. 03/05/16   Hali Marry, MD  SUMAtriptan (IMITREX) 100 MG tablet Take 1 tablet (100 mg total) by mouth every 2 (two) hours as needed. 11/25/11   Hali Marry, MD  triamterene-hydrochlorothiazide (MAXZIDE) 75-50 MG tablet TAKE 1 TABLET BY MOUTH DAILY. DUE FOR PCP FOLLOW UP IN 01/2015 12/27/15   Hali Marry, MD  valACYclovir (VALTREX) 1000 MG tablet TAKE 1 TABLET (1,000 MG TOTAL) BY MOUTH 2 (TWO) TIMES DAILY. (1/2) TAB TWICE DAILY. 02/05/16   Rene Kocher  [place], MD  VENTOLIN HFA 108 (90 BASE) MCG/ACT inhaler INHALE 2 PUFFS INTO THE LUNGS EVERY 6 (SIX) HOURS AS NEEDED. 04/11/15   Hali Marry, MD    Family History Family History  Problem Relation Age of Onset  . Hypertension Mother   . Hypertension Father   . Depression Father   . Glaucoma Father   . Parkinsonism Father   . Depression Sister   . Depression Sister     Social History Social History  Substance Use Topics  . Smoking status: Never Smoker  . Smokeless tobacco: Never Used  . Alcohol use 1.8 oz/week    3 Glasses of wine per week     Comment: per week     Allergies   Codeine   Review of Systems Review of Systems  Musculoskeletal: Positive for stiffness. Negative for back pain.  All other systems  reviewed and are negative.    Physical Exam Triage Vital Signs ED Triage Vitals  Enc Vitals Group     BP 03/16/16 1424 157/77     Pulse Rate 03/16/16 1424 71     Resp --      Temp 03/16/16 1424 98.1 F (36.7 C)     Temp Source 03/16/16 1424 Oral     SpO2 03/16/16 1424 96 %     Weight 03/16/16 1424 148 lb (67.1 kg)     Height 03/16/16 1424 5\' 2"  (1.575 m)     Head Circumference --      Peak Flow --      Pain Score 03/16/16 1425 2     Pain Loc --      Pain Edu? --      Excl. in [place]? --    No data found.   Updated Vital Signs BP 157/77 (BP Location: Left Arm)   Pulse 71   Temp 98.1 F (36.7 C) (Oral)   Ht 5\' 2"  (1.575 m)   Wt 148 lb (67.1 kg)   SpO2 96%   BMI 27.07 kg/m   Visual Acuity Right Eye Distance:   Left Eye Distance:   Bilateral Distance:    Right Eye Near:   Left Eye Near:    Bilateral Near:     Physical Exam  Constitutional: She appears well-developed and well-nourished. No distress.  HENT:  Head: Atraumatic.  Right Ear: External ear normal.  Left Ear: External ear normal.  Nose: Nose normal.  Mouth/Throat: Oropharynx is clear and moist.  Eyes: Conjunctivae are normal. Pupils are equal, round, and reactive to light.  Neck: Normal range of motion.  Cardiovascular: Normal heart sounds.   Pulmonary/Chest: Breath sounds normal.  Abdominal: There is no tenderness.  Musculoskeletal: She exhibits no deformity.       Left knee: She exhibits normal range of motion, no swelling, no effusion, no deformity, no laceration, normal alignment and no LCL laxity.       Right ankle: She exhibits decreased range of motion and swelling. She exhibits no ecchymosis, no deformity, no laceration and normal pulse. Tenderness. Lateral malleolus and medial malleolus tenderness found. No head of 5th metatarsal tenderness found. Achilles tendon normal.       Legs:      Feet:  Left knee has full range of motion.  There is minimal abrasion over the patella as noted on  diagram.   Right ankle:  Decreased range of motion.  Tenderness and swelling over the medial and lateral malleolus.  Joint stable.  No tenderness over the base of  the fifth metatarsal.  Distal neurovascular function is intact.   Neurological: She is alert.  Skin: Skin is warm and dry.  Nursing note and vitals reviewed.    UC Treatments / Results  Labs (all labs ordered are listed, but only abnormal results are displayed) Labs Reviewed - No data to display  EKG  EKG Interpretation None       Radiology Dg Ankle Complete Right  Result Date: 03/16/2016 CLINICAL DATA:  Right ankle pain after injury yesterday EXAM: RIGHT ANKLE - COMPLETE 3+ VIEW COMPARISON:  None. FINDINGS: Lateral right ankle soft tissue swelling. No fracture, subluxation or suspicious focal osseous lesion. Small plantar right calcaneal spur. No radiopaque foreign body. IMPRESSION: Lateral right ankle soft tissue swelling, with no fracture or subluxation. Electronically Signed   By: Ilona Sorrel M.D.   On: 03/16/2016 14:46    Procedures Procedures (including critical care time)  Medications Ordered in UC Medications - No data to display   Initial Impression / Assessment and Plan / UC Course  I have reviewed the triage vital signs and the nursing notes.  Pertinent labs & imaging results that were available during my care of the patient were reviewed by me and considered in my medical decision making (see chart for details).  Clinical Course  Ace wrap applied to right ankle and left knee.  Dispensed AirCast stirrup splint for left ankle.      Apply ice pack for 30 minutes every 1 to 2 hours today and tomorrow.  Elevate.  Wear Ace wraps until swelling decreases.  Wear brace for about 2 to 3 weeks.  Begin range of motion and stretching exercises in about 5 days as per instruction sheet. May take Ibuprofen 200mg , 4 tabs every 8 hours with food as needed.  Followup with Dr. Aundria Mems or Dr. Lynne Leader ([facility]) if not improving about two weeks.                                                                                                                      Final Clinical Impressions(s) / UC Diagnoses   Final diagnoses:  Grade 1 ankle sprain, right, initial encounter  Contusion of left knee, initial encounter    New Prescriptions New Prescriptions   No medications on file     Kandra Nicolas, MD 03/23/16 2250

## 2016-03-16 NOTE — Discharge Instructions (Signed)
Apply ice pack for 30 minutes every 1 to 2 hours today and tomorrow.  Elevate.  Wear Ace wraps until swelling decreases.  Wear brace for about 2 to 3 weeks.  Begin range of motion and stretching exercises in about 5 days as per instruction sheet. May take Ibuprofen 200mg , 4 tabs every 8 hours with food as needed.

## 2016-03-18 ENCOUNTER — Telehealth: Payer: Self-pay | Admitting: *Deleted

## 2016-03-18 NOTE — Telephone Encounter (Signed)
This is an  FYI that the nystatin/triam oint  is not on patient formulary. She was given a temporary 30 day supply back when it was prescribed on 10/10 which is probably all that is needed since it was for itchy nipples.if she requests something in the future we will have to write for something different.

## 2016-03-19 ENCOUNTER — Ambulatory Visit (HOSPITAL_BASED_OUTPATIENT_CLINIC_OR_DEPARTMENT_OTHER)
Admission: RE | Admit: 2016-03-19 | Discharge: 2016-03-19 | Disposition: A | Discharge status: Home / Self Care | Payer: Medicare Other | Source: Ambulatory Visit | Attending: Family Medicine | Admitting: Family Medicine

## 2016-03-19 DIAGNOSIS — R9431 Abnormal electrocardiogram [ECG] [EKG]: Secondary | ICD-10-CM | POA: Diagnosis not present

## 2016-03-19 DIAGNOSIS — I080 Rheumatic disorders of both mitral and aortic valves: Secondary | ICD-10-CM | POA: Diagnosis not present

## 2016-03-19 NOTE — Progress Notes (Signed)
  Echocardiogram 2D Echocardiogram has been performed.  [name] 03/19/2016, 10:03 AM

## 2016-03-25 ENCOUNTER — Encounter: Payer: Self-pay | Admitting: Family Medicine

## 2016-03-25 ENCOUNTER — Ambulatory Visit (INDEPENDENT_AMBULATORY_CARE_PROVIDER_SITE_OTHER): Payer: Medicare Other | Admitting: Family Medicine

## 2016-03-25 DIAGNOSIS — IMO0001 Reserved for inherently not codable concepts without codable children: Secondary | ICD-10-CM | POA: Insufficient documentation

## 2016-03-25 DIAGNOSIS — S93401A Sprain of unspecified ligament of right ankle, initial encounter: Secondary | ICD-10-CM

## 2016-03-25 NOTE — Progress Notes (Signed)
   Subjective:    I'm seeing this patient as a consultation for:  Dr Assunta Found and Beatrice Lecher, MD   CC: Ankle sprain.  HPI: She was seen in urgent care on October 29 for right ankle sprain. She suffered an inversion injury. She noted considerable lateral ankle pain and swelling. X-rays were initially negative. She was treated with compression Aircast and postoperative shoe. She notes the pain has improved however she continues to have some pain. She denies any radiating pain weakness or numbness. She has tried some over-the-counter medicines for pain which has helped some.  Past medical history, Surgical history, Family history not pertinant except as noted below, Social history, Allergies, and medications have been entered into the medical record, reviewed, and no changes needed.   Review of Systems: No headache, visual changes, nausea, vomiting, diarrhea, constipation, dizziness, abdominal pain, skin rash, fevers, chills, night sweats, weight loss, swollen lymph nodes, body aches, joint swelling, muscle aches, chest pain, shortness of breath, mood changes, visual or auditory hallucinations.   Objective:    Vitals:   03/25/16 1509  BP: (!) 144/62  Pulse: 64   General: Well Developed, well nourished, and in no acute distress.  Neuro/Psych: Alert and oriented x3, extra-ocular muscles intact, able to move all 4 extremities, sensation grossly intact. Skin: Warm and dry, no rashes noted.  Respiratory: Not using accessory muscles, speaking in full sentences, trachea midline.  Cardiovascular: Pulses palpable, no extremity edema. Abdomen: Does not appear distended. MSK:  Right ankle: Swollen with ecchymosis laterally.  tender to palpation right ATFL area. Stable ligamentous exam. Also is capillary refill and sensation intact distally.  X-ray right ankle reviewed from October 29   No results found for this or any previous visit (from the past 24 hour(s)). No results  found.  Impression and Recommendations:    Assessment and Plan: 65 y.o. female with Ankle sprain of the right ankle. Improving. Recommend ankle compression sleeve. Ace wrap applied today as well. Recommend discontinuing the postoperative shoe and Aircast when able. Additionally recommended home exercise program and physical therapy. Referral for physical therapy ordered. Recheck in a few weeks   Orders Placed This Encounter  Procedures  . Ambulatory referral to Physical Therapy    Referral Priority:   Routine    Referral Type:   Physical Medicine    Referral Reason:   Specialty Services Required    Requested Specialty:   Physical Therapy    Number of Visits Requested:   1    Discussed warning signs or symptoms. Please see discharge instructions. Patient expresses understanding.

## 2016-03-25 NOTE — Patient Instructions (Signed)
Thank you for coming in today. Use the compression sleeve.  Continue exercises.  Attend PT.  Consider the Pneumovax Pneumonia 13 vaccine.   Return for recheck in 3 weeks.    Acute Ankle Sprain With Phase I Rehab An acute ankle sprain is a partial or complete tear in one or more of the ligaments of the ankle due to traumatic injury. The severity of the injury depends on both the number of ligaments sprained and the grade of sprain. There are 3 grades of sprains.   A grade 1 sprain is a mild sprain. There is a slight pull without obvious tearing. There is no loss of strength, and the muscle and ligament are the correct length.  A grade 2 sprain is a moderate sprain. There is tearing of fibers within the substance of the ligament where it connects two bones or two cartilages. The length of the ligament is increased, and there is usually decreased strength.  A grade 3 sprain is a complete rupture of the ligament and is uncommon. In addition to the grade of sprain, there are three types of ankle sprains.  Lateral ankle sprains: This is a sprain of one or more of the three ligaments on the outer side (lateral) of the ankle. These are the most common sprains. Medial ankle sprains: There is one large triangular ligament of the inner side (medial) of the ankle that is susceptible to injury. Medial ankle sprains are less common. Syndesmosis, "high ankle," sprains: The syndesmosis is the ligament that connects the two bones of the lower leg. Syndesmosis sprains usually only occur with very severe ankle sprains. SYMPTOMS  Pain, tenderness, and swelling in the ankle, starting at the side of injury that may progress to the whole ankle and foot with time.  "Pop" or tearing sensation at the time of injury.  Bruising that may spread to the heel.  Impaired ability to walk soon after injury. CAUSES   Acute ankle sprains are caused by trauma placed on the ankle that temporarily forces or pries the  anklebone (talus) out of its normal socket.  Stretching or tearing of the ligaments that normally hold the joint in place (usually due to a twisting injury). RISK INCREASES WITH:  Previous ankle sprain.  Sports in which the foot may land awkwardly (i.e., basketball, volleyball, or soccer) or walking or running on uneven or rough surfaces.  Shoes with inadequate support to prevent sideways motion when stress occurs.  Poor strength and flexibility.  Poor balance skills.  Contact sports. PREVENTION   Warm up and stretch properly before activity.  Maintain physical fitness:  Ankle and leg flexibility, muscle strength, and endurance.  Cardiovascular fitness.  Balance training activities.  Use proper technique and have a coach correct improper technique.  Taping, protective strapping, bracing, or high-top tennis shoes may help prevent injury. Initially, tape is best; however, it loses most of its support function within 10 to 15 minutes.  Wear proper-fitted protective shoes (High-top shoes with taping or bracing is more effective than either alone).  Provide the ankle with support during sports and practice activities for 12 months following injury. PROGNOSIS   If treated properly, ankle sprains can be expected to recover completely; however, the length of recovery depends on the degree of injury.  A grade 1 sprain usually heals enough in 5 to 7 days to allow modified activity and requires an average of 6 weeks to heal completely.  A grade 2 sprain requires 6 to 10 weeks to heal  completely.  A grade 3 sprain requires 12 to 16 weeks to heal.  A syndesmosis sprain often takes more than 3 months to heal. RELATED COMPLICATIONS   Frequent recurrence of symptoms may result in a chronic problem. Appropriately addressing the problem the first time decreases the frequency of recurrence and optimizes healing time. Severity of the initial sprain does not predict the likelihood of later  instability.  Injury to other structures (bone, cartilage, or tendon).  A chronically unstable or arthritic ankle joint is a possibility with repeated sprains. TREATMENT Treatment initially involves the use of ice, medication, and compression bandages to help reduce pain and inflammation. Ankle sprains are usually immobilized in a walking cast or boot to allow for healing. Crutches may be recommended to reduce pressure on the injury. After immobilization, strengthening and stretching exercises may be necessary to regain strength and a full range of motion. Surgery is rarely needed to treat ankle sprains. MEDICATION   Nonsteroidal anti-inflammatory medications, such as aspirin and ibuprofen (do not take for the first 3 days after injury or within 7 days before surgery), or other minor pain relievers, such as acetaminophen, are often recommended. Take these as directed by your caregiver. Contact your caregiver immediately if any bleeding, stomach upset, or signs of an allergic reaction occur from these medications.  Ointments applied to the skin may be helpful.  Pain relievers may be prescribed as necessary by your caregiver. Do not take prescription pain medication for longer than 4 to 7 days. Use only as directed and only as much as you need. HEAT AND COLD  Cold treatment (icing) is used to relieve pain and reduce inflammation for acute and chronic cases. Cold should be applied for 10 to 15 minutes every 2 to 3 hours for inflammation and pain and immediately after any activity that aggravates your symptoms. Use ice packs or an ice massage.  Heat treatment may be used before performing stretching and strengthening activities prescribed by your caregiver. Use a heat pack or a warm soak. SEEK IMMEDIATE MEDICAL CARE IF:   Pain, swelling, or bruising worsens despite treatment.  You experience pain, numbness, discoloration, or coldness in the foot or toes.  New, unexplained symptoms develop (drugs  used in treatment may produce side effects.) EXERCISES  PHASE I EXERCISES RANGE OF MOTION (ROM) AND STRETCHING EXERCISES - Ankle Sprain, Acute Phase I, Weeks 1 to 2 These exercises may help you when beginning to restore flexibility in your ankle. You will likely work on these exercises for the 1 to 2 weeks after your injury. Once your physician, physical therapist, or athletic trainer sees adequate progress, he or she will advance your exercises. While completing these exercises, remember:   Restoring tissue flexibility helps normal motion to return to the joints. This allows healthier, less painful movement and activity.  An effective stretch should be held for at least 30 seconds.  A stretch should never be painful. You should only feel a gentle lengthening or release in the stretched tissue. RANGE OF MOTION - Dorsi/Plantar Flexion  While sitting with your right / left knee straight, draw the top of your foot upwards by flexing your ankle. Then reverse the motion, pointing your toes downward.  Hold each position for __________ seconds.  After completing your first set of exercises, repeat this exercise with your knee bent. Repeat __________ times. Complete this exercise __________ times per day.  RANGE OF MOTION - Ankle Alphabet  Imagine your right / left big toe is a pen.  Keeping your hip and knee still, write out the entire alphabet with your "pen." Make the letters as large as you can without increasing any discomfort. Repeat __________ times. Complete this exercise __________ times per day.  STRENGTHENING EXERCISES - Ankle Sprain, Acute -Phase I, Weeks 1 to 2 These exercises may help you when beginning to restore strength in your ankle. You will likely work on these exercises for 1 to 2 weeks after your injury. Once your physician, physical therapist, or athletic trainer sees adequate progress, he or she will advance your exercises. While completing these exercises, remember:    Muscles can gain both the endurance and the strength needed for everyday activities through controlled exercises.  Complete these exercises as instructed by your physician, physical therapist, or athletic trainer. Progress the resistance and repetitions only as guided.  You may experience muscle soreness or fatigue, but the pain or discomfort you are trying to eliminate should never worsen during these exercises. If this pain does worsen, stop and make certain you are following the directions exactly. If the pain is still present after adjustments, discontinue the exercise until you can discuss the trouble with your clinician. STRENGTH - Dorsiflexors  Secure a rubber exercise band/tubing to a fixed object (i.e., table, pole) and loop the other end around your right / left foot.  Sit on the floor facing the fixed object. The band/tubing should be slightly tense when your foot is relaxed.  Slowly draw your foot back toward you using your ankle and toes.  Hold this position for __________ seconds. Slowly release the tension in the band and return your foot to the starting position. Repeat __________ times. Complete this exercise __________ times per day.  STRENGTH - Plantar-flexors   Sit with your right / left leg extended. Holding onto both ends of a rubber exercise band/tubing, loop it around the ball of your foot. Keep a slight tension in the band.  Slowly push your toes away from you, pointing them downward.  Hold this position for __________ seconds. Return slowly, controlling the tension in the band/tubing. Repeat __________ times. Complete this exercise __________ times per day.  STRENGTH - Ankle Eversion  Secure one end of a rubber exercise band/tubing to a fixed object (table, pole). Loop the other end around your foot just before your toes.  Place your fists between your knees. This will focus your strengthening at your ankle.  Drawing the band/tubing across your opposite  foot, slowly, pull your little toe out and up. Make sure the band/tubing is positioned to resist the entire motion.  Hold this position for __________ seconds. Have your muscles resist the band/tubing as it slowly pulls your foot back to the starting position.  Repeat __________ times. Complete this exercise __________ times per day.  STRENGTH - Ankle Inversion  Secure one end of a rubber exercise band/tubing to a fixed object (table, pole). Loop the other end around your foot just before your toes.  Place your fists between your knees. This will focus your strengthening at your ankle.  Slowly, pull your big toe up and in, making sure the band/tubing is positioned to resist the entire motion.  Hold this position for __________ seconds.  Have your muscles resist the band/tubing as it slowly pulls your foot back to the starting position. Repeat __________ times. Complete this exercises __________ times per day.  STRENGTH - Towel Curls  Sit in a chair positioned on a non-carpeted surface.  Place your right / left foot on a  towel, keeping your heel on the floor.  Pull the towel toward your heel by only curling your toes. Keep your heel on the floor.  If instructed by your physician, physical therapist, or athletic trainer, add weight to the end of the towel. Repeat __________ times. Complete this exercise __________ times per day.   This information is not intended to replace advice given to you by your health care provider. Make sure you discuss any questions you have with your health care provider.   Document Released: 12/04/2004 Document Revised: 05/26/2014 Document Reviewed: 08/17/2008 Elsevier Interactive Patient Education [employer].

## 2016-03-26 DIAGNOSIS — Z1231 Encounter for screening mammogram for malignant neoplasm of breast: Secondary | ICD-10-CM | POA: Diagnosis not present

## 2016-03-26 DIAGNOSIS — R922 Inconclusive mammogram: Secondary | ICD-10-CM | POA: Diagnosis not present

## 2016-03-26 DIAGNOSIS — Z1239 Encounter for other screening for malignant neoplasm of breast: Secondary | ICD-10-CM | POA: Diagnosis not present

## 2016-03-26 LAB — HM MAMMOGRAPHY

## 2016-03-31 ENCOUNTER — Ambulatory Visit: Payer: Medicare Other | Admitting: Family Medicine

## 2016-04-02 DIAGNOSIS — N6320 Unspecified lump in the left breast, unspecified quadrant: Secondary | ICD-10-CM | POA: Diagnosis not present

## 2016-04-02 DIAGNOSIS — R922 Inconclusive mammogram: Secondary | ICD-10-CM | POA: Diagnosis not present

## 2016-04-02 DIAGNOSIS — C50112 Malignant neoplasm of central portion of left female breast: Secondary | ICD-10-CM | POA: Diagnosis not present

## 2016-04-02 DIAGNOSIS — R928 Other abnormal and inconclusive findings on diagnostic imaging of breast: Secondary | ICD-10-CM | POA: Diagnosis not present

## 2016-04-03 ENCOUNTER — Encounter: Payer: Self-pay | Admitting: Family Medicine

## 2016-04-03 DIAGNOSIS — N6320 Unspecified lump in the left breast, unspecified quadrant: Secondary | ICD-10-CM | POA: Diagnosis not present

## 2016-04-03 LAB — HM MAMMOGRAPHY

## 2016-04-04 ENCOUNTER — Ambulatory Visit (INDEPENDENT_AMBULATORY_CARE_PROVIDER_SITE_OTHER): Payer: Medicare Other | Admitting: Physical Therapy

## 2016-04-04 ENCOUNTER — Encounter: Payer: Self-pay | Admitting: Physical Therapy

## 2016-04-04 DIAGNOSIS — M6281 Muscle weakness (generalized): Secondary | ICD-10-CM | POA: Diagnosis present

## 2016-04-04 DIAGNOSIS — R600 Localized edema: Secondary | ICD-10-CM | POA: Diagnosis not present

## 2016-04-04 DIAGNOSIS — M25671 Stiffness of right ankle, not elsewhere classified: Secondary | ICD-10-CM | POA: Diagnosis not present

## 2016-04-04 NOTE — Therapy (Addendum)
[facility] [place], [place], [address] Phone: [phone]   Fax:  [phone]  Physical Therapy Evaluation  Patient Details  Name: [name] MRN: [id number] Date of Birth: 11-27-1950 Referring Provider: Gregor Hams  Encounter Date: 04/04/2016      PT End of Session - 04/04/16 1229    Visit Number 1   Number of Visits 6   Date for PT Re-Evaluation 05/16/16   Authorization Time Period G-code/progress note at 10th visist   Authorization - Visit Number 1   PT Start Time 6314   PT Stop Time 9702   PT Time Calculation (min) 39 min   Activity Tolerance Patient tolerated treatment well;No increased pain   Behavior During Therapy WFL for tasks assessed/performed      Past Medical History:  Diagnosis Date  . HSV (herpes simplex virus) anogenital infection   . Meniere's disease   . Migraine with visual aura    optical  . Postmenopausal   . Volvulus ([place])     Past Surgical History:  Procedure Laterality Date  . COLON SURGERY  2003   resection/small bowel ischemia  . ESOPHAGEAL DILATION    . HERNIA REPAIR    . LTCS    . OVARIAN CYST REMOVAL    . SMALL INTESTINE SURGERY  02/2010  . TYMPANOSTOMY TUBE PLACEMENT     placement RT, out now.     There were no vitals filed for this visit.       Subjective Assessment - 04/04/16 1145    Subjective Missed a step and fell forward, spraining her right ankle on 03/15/16. Since that time, the ankle has been improving but not quite 100%.    Pertinent History Meniere's disease   How long can you sit comfortably? unlimited   How long can you stand comfortably? unlimited   How long can you walk comfortably? unlimited   Patient Stated Goals Get strength and ROM back. Go up/down stairs.    Currently in Pain? No/denies            [facility] PT Assessment - 04/04/16 0001      Assessment   Medical Diagnosis first degree ankle sprain Right    Referring Provider Gregor Hams   Onset Date/Surgical Date 03/15/16   Next MD Visit as needed   Prior Therapy none     Balance Screen   Has the patient fallen in the past 6 months Yes   How many times? 1   Has the patient had a decrease in activity level because of a fear of falling?  No   Is the patient reluctant to leave their home because of a fear of falling?  No     Prior Function   Vocation Part time employment   Vocation Requirements sitting, 4 hours/day     Observation/Other Assessments   Observations mild swelling noted along lateral malleolus   Focus on Therapeutic Outcomes (FOTO)  45% limited     Sensation   Light Touch Appears Intact     AROM   Right Ankle Dorsiflexion 6   Right Ankle Plantar Flexion 48   Right Ankle Inversion 32   Right Ankle Eversion 12     Strength   Right Ankle Dorsiflexion 4/5  pain   Right Ankle Plantar Flexion 4+/5   Right Ankle Inversion 4/5   Right Ankle Eversion 4/5     Palpation   Palpation comment tender at inferior and anterior lateral  malleolus (right)     Ambulation/Gait   Gait Comments uncompensated pattern on flat/level surface                                PT Long Term Goals - 04/04/16 1239      PT LONG TERM GOAL #1   Title Pt to be independent with a HEP for Rt LE strength and ROM. 05/16/16   Time 6   Period Weeks   Status New     PT LONG TERM GOAL #2   Title FOTO to be =/< 32% limitation. 05/16/16   Time 6   Period Weeks   Status New     PT LONG TERM GOAL #3   Title Pt to demonstrate 5/5 strength through Rt ankle for gait stability. 05/16/16   Time 6   Period Weeks   Status New     PT LONG TERM GOAL #4   Title Pt to demo 10 degrees or more Rt ankle dorsiflexion for gait stability. 05/16/16   Time 6   Period Weeks   Status New     PT LONG TERM GOAL #5   Title Pt to ambulate up/down stairs with alternating pattern and no pain. 05/16/16   Time 6   Period Weeks   Status New                Plan - 04/04/16 1231    Clinical Impression Statement Pt is presenting to OPPT services following a fall on 03/15/16 with resulting Rt ankle sprain. She is currently demonstrating decreased Rt ankle ROM and strength. Functionally she reports difficulty with performing stairs. Overall the pt is appropriate for continued PT services to improver her function and maximize her mobility.    Rehab Potential Excellent   PT Frequency 1x / week   PT Duration 6 weeks   PT Treatment/Interventions ADLs/Self Care Home Management;Cryotherapy;Electrical Stimulation;Iontophoresis 18m/ml Dexamethasone;Moist Heat;Ultrasound;Gait training;Functional mobility training;Therapeutic activities;Therapeutic exercise;Patient/family education;Taping   PT Next Visit Plan Review and advance strengthening, attempt balance activities.    Consulted and Agree with Plan of Care Patient      Patient will benefit from skilled therapeutic intervention in order to improve the following deficits and impairments:  Decreased strength, Increased edema, Impaired flexibility, Decreased range of motion  Visit Diagnosis: Muscle weakness (generalized) - Plan: PT plan of care cert/re-cert  Stiffness of right ankle, not elsewhere classified - Plan: PT plan of care cert/re-cert  Localized edema - Plan: PT plan of care cert/re-cert     Problem List Patient Active Problem List   Diagnosis Date Noted  . First degree ankle sprain, right, initial encounter 03/25/2016  . Meniere disease 08/04/2014  . Left lumbar radiculitis 04/11/2013  . Gastric ulcer 02/10/2011  . Hiatal hernia 02/07/2011  . OSTEOPENIA 12/02/2006  . HYPERLIPIDEMIA 10/20/2006  . HSV 06/16/2006  . Major depressive disorder, recurrent episode ([place] 02/24/2006  . MIGRAINE, UNSPEC., W/O INTRACTABLE MIGRAINE 02/24/2006  . Asthma, moderate persistent 02/24/2006  . MENOPAUSAL SYNDROME 02/24/2006    BLinard Millers PT, CSCS 04/04/2016, 12:52  PM  [facility] [address]Phone: [phone]  Fax:  [phone] Name: Abigail ERNSTERMRN: [id number] of Birth: 902/14/1952  PHYSICAL THERAPY DISCHARGE SUMMARY  Visits from Start of Care: 1  Current functional level related to goals / functional outcomes: As noted above.    Remaining  deficits: Pt seen for single PT evaluation/treatment. Pt cancelling f/u appointments due to other health concerns needing attention.    Education / Equipment: As noted above.   Plan: Patient agrees to discharge.  Patient goals were not met. Patient is being discharged due to the patient's request.  ?????     Cassell Clement, PT, [place] Pager [phone] Office [phone]

## 2016-04-09 DIAGNOSIS — Z170 Estrogen receptor positive status [ER+]: Secondary | ICD-10-CM | POA: Insufficient documentation

## 2016-04-09 DIAGNOSIS — C50112 Malignant neoplasm of central portion of left female breast: Secondary | ICD-10-CM | POA: Insufficient documentation

## 2016-04-11 DIAGNOSIS — C50112 Malignant neoplasm of central portion of left female breast: Secondary | ICD-10-CM | POA: Diagnosis not present

## 2016-04-11 DIAGNOSIS — R590 Localized enlarged lymph nodes: Secondary | ICD-10-CM | POA: Diagnosis not present

## 2016-04-11 DIAGNOSIS — C50912 Malignant neoplasm of unspecified site of left female breast: Secondary | ICD-10-CM | POA: Diagnosis not present

## 2016-04-14 DIAGNOSIS — C50112 Malignant neoplasm of central portion of left female breast: Secondary | ICD-10-CM | POA: Diagnosis not present

## 2016-04-14 DIAGNOSIS — Z170 Estrogen receptor positive status [ER+]: Secondary | ICD-10-CM | POA: Diagnosis not present

## 2016-04-14 DIAGNOSIS — J4530 Mild persistent asthma, uncomplicated: Secondary | ICD-10-CM | POA: Diagnosis not present

## 2016-04-14 DIAGNOSIS — D649 Anemia, unspecified: Secondary | ICD-10-CM | POA: Diagnosis not present

## 2016-04-16 DIAGNOSIS — D500 Iron deficiency anemia secondary to blood loss (chronic): Secondary | ICD-10-CM | POA: Insufficient documentation

## 2016-04-17 DIAGNOSIS — C50112 Malignant neoplasm of central portion of left female breast: Secondary | ICD-10-CM | POA: Diagnosis not present

## 2016-04-17 DIAGNOSIS — Z170 Estrogen receptor positive status [ER+]: Secondary | ICD-10-CM | POA: Diagnosis not present

## 2016-04-18 ENCOUNTER — Telehealth: Payer: Self-pay | Admitting: Family Medicine

## 2016-04-18 NOTE — Telephone Encounter (Signed)
Ok, I definitely understand.  Delayed HM tool for one year.

## 2016-04-18 NOTE — Telephone Encounter (Signed)
Received letter from [facility] stating Pt is due for her colonoscopy.  Contacted Pt, she was recently diagnosed with breast cancer so she is going to get her colonoscopy after she finishes radiation treatments. Pt does state she will have test completed in year 2018.   Pt is scheduled for a lumpectomy on 04/29/16 then will begin radiation.

## 2016-04-21 ENCOUNTER — Encounter: Payer: Medicare Other | Admitting: Physical Therapy

## 2016-04-22 DIAGNOSIS — Z5181 Encounter for therapeutic drug level monitoring: Secondary | ICD-10-CM | POA: Diagnosis not present

## 2016-04-22 DIAGNOSIS — D500 Iron deficiency anemia secondary to blood loss (chronic): Secondary | ICD-10-CM | POA: Diagnosis not present

## 2016-04-22 DIAGNOSIS — Z79899 Other long term (current) drug therapy: Secondary | ICD-10-CM | POA: Diagnosis not present

## 2016-04-22 DIAGNOSIS — D519 Vitamin B12 deficiency anemia, unspecified: Secondary | ICD-10-CM | POA: Diagnosis not present

## 2016-04-22 DIAGNOSIS — C50112 Malignant neoplasm of central portion of left female breast: Secondary | ICD-10-CM | POA: Diagnosis not present

## 2016-04-28 ENCOUNTER — Encounter: Payer: Medicare Other | Admitting: Physical Therapy

## 2016-04-29 DIAGNOSIS — C50112 Malignant neoplasm of central portion of left female breast: Secondary | ICD-10-CM | POA: Diagnosis not present

## 2016-04-29 DIAGNOSIS — C50912 Malignant neoplasm of unspecified site of left female breast: Secondary | ICD-10-CM | POA: Diagnosis not present

## 2016-04-29 DIAGNOSIS — D649 Anemia, unspecified: Secondary | ICD-10-CM | POA: Diagnosis not present

## 2016-04-29 DIAGNOSIS — Z885 Allergy status to narcotic agent status: Secondary | ICD-10-CM | POA: Diagnosis not present

## 2016-04-29 DIAGNOSIS — J45909 Unspecified asthma, uncomplicated: Secondary | ICD-10-CM | POA: Diagnosis not present

## 2016-04-29 DIAGNOSIS — Z170 Estrogen receptor positive status [ER+]: Secondary | ICD-10-CM | POA: Diagnosis not present

## 2016-05-07 DIAGNOSIS — D519 Vitamin B12 deficiency anemia, unspecified: Secondary | ICD-10-CM | POA: Diagnosis not present

## 2016-05-07 DIAGNOSIS — C50112 Malignant neoplasm of central portion of left female breast: Secondary | ICD-10-CM | POA: Diagnosis not present

## 2016-05-07 DIAGNOSIS — Z9012 Acquired absence of left breast and nipple: Secondary | ICD-10-CM | POA: Diagnosis not present

## 2016-05-07 DIAGNOSIS — I890 Lymphedema, not elsewhere classified: Secondary | ICD-10-CM | POA: Diagnosis not present

## 2016-05-07 DIAGNOSIS — D509 Iron deficiency anemia, unspecified: Secondary | ICD-10-CM | POA: Diagnosis not present

## 2016-05-07 DIAGNOSIS — Z170 Estrogen receptor positive status [ER+]: Secondary | ICD-10-CM | POA: Diagnosis not present

## 2016-05-07 DIAGNOSIS — E559 Vitamin D deficiency, unspecified: Secondary | ICD-10-CM | POA: Diagnosis not present

## 2016-05-07 DIAGNOSIS — Z5181 Encounter for therapeutic drug level monitoring: Secondary | ICD-10-CM | POA: Diagnosis not present

## 2016-05-07 DIAGNOSIS — D500 Iron deficiency anemia secondary to blood loss (chronic): Secondary | ICD-10-CM | POA: Diagnosis not present

## 2016-05-07 DIAGNOSIS — Z79899 Other long term (current) drug therapy: Secondary | ICD-10-CM | POA: Diagnosis not present

## 2016-05-07 DIAGNOSIS — D0502 Lobular carcinoma in situ of left breast: Secondary | ICD-10-CM | POA: Diagnosis not present

## 2016-05-16 ENCOUNTER — Encounter: Payer: Self-pay | Admitting: Family Medicine

## 2016-05-22 DIAGNOSIS — Z170 Estrogen receptor positive status [ER+]: Secondary | ICD-10-CM | POA: Diagnosis not present

## 2016-05-22 DIAGNOSIS — Z9889 Other specified postprocedural states: Secondary | ICD-10-CM | POA: Diagnosis not present

## 2016-05-22 DIAGNOSIS — C50112 Malignant neoplasm of central portion of left female breast: Secondary | ICD-10-CM | POA: Diagnosis not present

## 2016-05-22 DIAGNOSIS — Z79811 Long term (current) use of aromatase inhibitors: Secondary | ICD-10-CM | POA: Diagnosis not present

## 2016-05-23 DIAGNOSIS — Z5181 Encounter for therapeutic drug level monitoring: Secondary | ICD-10-CM | POA: Diagnosis not present

## 2016-05-23 DIAGNOSIS — Z170 Estrogen receptor positive status [ER+]: Secondary | ICD-10-CM | POA: Diagnosis not present

## 2016-05-23 DIAGNOSIS — M85862 Other specified disorders of bone density and structure, left lower leg: Secondary | ICD-10-CM

## 2016-05-23 DIAGNOSIS — M85861 Other specified disorders of bone density and structure, right lower leg: Secondary | ICD-10-CM | POA: Insufficient documentation

## 2016-05-23 DIAGNOSIS — M85851 Other specified disorders of bone density and structure, right thigh: Secondary | ICD-10-CM | POA: Diagnosis not present

## 2016-05-23 DIAGNOSIS — C50112 Malignant neoplasm of central portion of left female breast: Secondary | ICD-10-CM | POA: Diagnosis not present

## 2016-05-23 DIAGNOSIS — M8588 Other specified disorders of bone density and structure, other site: Secondary | ICD-10-CM | POA: Diagnosis not present

## 2016-05-23 LAB — HM DEXA SCAN

## 2016-05-26 DIAGNOSIS — C50112 Malignant neoplasm of central portion of left female breast: Secondary | ICD-10-CM | POA: Diagnosis not present

## 2016-05-26 DIAGNOSIS — H8109 Meniere's disease, unspecified ear: Secondary | ICD-10-CM | POA: Diagnosis not present

## 2016-05-26 DIAGNOSIS — M7989 Other specified soft tissue disorders: Secondary | ICD-10-CM | POA: Diagnosis not present

## 2016-05-26 DIAGNOSIS — Z170 Estrogen receptor positive status [ER+]: Secondary | ICD-10-CM | POA: Diagnosis not present

## 2016-05-26 DIAGNOSIS — Z79811 Long term (current) use of aromatase inhibitors: Secondary | ICD-10-CM | POA: Diagnosis not present

## 2016-05-28 DIAGNOSIS — Z170 Estrogen receptor positive status [ER+]: Secondary | ICD-10-CM | POA: Diagnosis not present

## 2016-05-28 DIAGNOSIS — C50112 Malignant neoplasm of central portion of left female breast: Secondary | ICD-10-CM | POA: Diagnosis not present

## 2016-05-28 DIAGNOSIS — Z510 Encounter for antineoplastic radiation therapy: Secondary | ICD-10-CM | POA: Diagnosis not present

## 2016-05-30 DIAGNOSIS — C50112 Malignant neoplasm of central portion of left female breast: Secondary | ICD-10-CM | POA: Diagnosis not present

## 2016-05-30 DIAGNOSIS — Z510 Encounter for antineoplastic radiation therapy: Secondary | ICD-10-CM | POA: Diagnosis not present

## 2016-05-30 DIAGNOSIS — Z170 Estrogen receptor positive status [ER+]: Secondary | ICD-10-CM | POA: Diagnosis not present

## 2016-06-04 ENCOUNTER — Encounter: Payer: Self-pay | Admitting: Family Medicine

## 2016-06-05 DIAGNOSIS — Z170 Estrogen receptor positive status [ER+]: Secondary | ICD-10-CM | POA: Diagnosis not present

## 2016-06-05 DIAGNOSIS — C50112 Malignant neoplasm of central portion of left female breast: Secondary | ICD-10-CM | POA: Diagnosis not present

## 2016-06-05 DIAGNOSIS — Z510 Encounter for antineoplastic radiation therapy: Secondary | ICD-10-CM | POA: Diagnosis not present

## 2016-06-06 DIAGNOSIS — Z170 Estrogen receptor positive status [ER+]: Secondary | ICD-10-CM | POA: Diagnosis not present

## 2016-06-06 DIAGNOSIS — Z510 Encounter for antineoplastic radiation therapy: Secondary | ICD-10-CM | POA: Diagnosis not present

## 2016-06-06 DIAGNOSIS — C50112 Malignant neoplasm of central portion of left female breast: Secondary | ICD-10-CM | POA: Diagnosis not present

## 2016-06-09 DIAGNOSIS — C50112 Malignant neoplasm of central portion of left female breast: Secondary | ICD-10-CM | POA: Diagnosis not present

## 2016-06-09 DIAGNOSIS — Z510 Encounter for antineoplastic radiation therapy: Secondary | ICD-10-CM | POA: Diagnosis not present

## 2016-06-09 DIAGNOSIS — Z170 Estrogen receptor positive status [ER+]: Secondary | ICD-10-CM | POA: Diagnosis not present

## 2016-06-10 DIAGNOSIS — C50112 Malignant neoplasm of central portion of left female breast: Secondary | ICD-10-CM | POA: Diagnosis not present

## 2016-06-10 DIAGNOSIS — Z510 Encounter for antineoplastic radiation therapy: Secondary | ICD-10-CM | POA: Diagnosis not present

## 2016-06-10 DIAGNOSIS — Z170 Estrogen receptor positive status [ER+]: Secondary | ICD-10-CM | POA: Diagnosis not present

## 2016-06-11 DIAGNOSIS — Z170 Estrogen receptor positive status [ER+]: Secondary | ICD-10-CM | POA: Diagnosis not present

## 2016-06-11 DIAGNOSIS — C50112 Malignant neoplasm of central portion of left female breast: Secondary | ICD-10-CM | POA: Diagnosis not present

## 2016-06-11 DIAGNOSIS — Z510 Encounter for antineoplastic radiation therapy: Secondary | ICD-10-CM | POA: Diagnosis not present

## 2016-06-12 DIAGNOSIS — Z170 Estrogen receptor positive status [ER+]: Secondary | ICD-10-CM | POA: Diagnosis not present

## 2016-06-12 DIAGNOSIS — Z510 Encounter for antineoplastic radiation therapy: Secondary | ICD-10-CM | POA: Diagnosis not present

## 2016-06-12 DIAGNOSIS — C50112 Malignant neoplasm of central portion of left female breast: Secondary | ICD-10-CM | POA: Diagnosis not present

## 2016-06-13 DIAGNOSIS — C50112 Malignant neoplasm of central portion of left female breast: Secondary | ICD-10-CM | POA: Diagnosis not present

## 2016-06-13 DIAGNOSIS — Z510 Encounter for antineoplastic radiation therapy: Secondary | ICD-10-CM | POA: Diagnosis not present

## 2016-06-13 DIAGNOSIS — Z170 Estrogen receptor positive status [ER+]: Secondary | ICD-10-CM | POA: Diagnosis not present

## 2016-06-16 DIAGNOSIS — C50112 Malignant neoplasm of central portion of left female breast: Secondary | ICD-10-CM | POA: Diagnosis not present

## 2016-06-16 DIAGNOSIS — Z510 Encounter for antineoplastic radiation therapy: Secondary | ICD-10-CM | POA: Diagnosis not present

## 2016-06-16 DIAGNOSIS — Z170 Estrogen receptor positive status [ER+]: Secondary | ICD-10-CM | POA: Diagnosis not present

## 2016-06-17 DIAGNOSIS — Z510 Encounter for antineoplastic radiation therapy: Secondary | ICD-10-CM | POA: Diagnosis not present

## 2016-06-17 DIAGNOSIS — Z170 Estrogen receptor positive status [ER+]: Secondary | ICD-10-CM | POA: Diagnosis not present

## 2016-06-17 DIAGNOSIS — C50112 Malignant neoplasm of central portion of left female breast: Secondary | ICD-10-CM | POA: Diagnosis not present

## 2016-06-18 DIAGNOSIS — Z510 Encounter for antineoplastic radiation therapy: Secondary | ICD-10-CM | POA: Diagnosis not present

## 2016-06-18 DIAGNOSIS — C50112 Malignant neoplasm of central portion of left female breast: Secondary | ICD-10-CM | POA: Diagnosis not present

## 2016-06-18 DIAGNOSIS — Z170 Estrogen receptor positive status [ER+]: Secondary | ICD-10-CM | POA: Diagnosis not present

## 2016-06-19 DIAGNOSIS — C50112 Malignant neoplasm of central portion of left female breast: Secondary | ICD-10-CM | POA: Diagnosis not present

## 2016-06-19 DIAGNOSIS — Z510 Encounter for antineoplastic radiation therapy: Secondary | ICD-10-CM | POA: Diagnosis not present

## 2016-06-19 DIAGNOSIS — Z170 Estrogen receptor positive status [ER+]: Secondary | ICD-10-CM | POA: Diagnosis not present

## 2016-06-20 DIAGNOSIS — Z170 Estrogen receptor positive status [ER+]: Secondary | ICD-10-CM | POA: Diagnosis not present

## 2016-06-20 DIAGNOSIS — C50112 Malignant neoplasm of central portion of left female breast: Secondary | ICD-10-CM | POA: Diagnosis not present

## 2016-06-20 DIAGNOSIS — Z510 Encounter for antineoplastic radiation therapy: Secondary | ICD-10-CM | POA: Diagnosis not present

## 2016-06-22 ENCOUNTER — Other Ambulatory Visit: Payer: Self-pay | Admitting: Family Medicine

## 2016-06-23 DIAGNOSIS — C50112 Malignant neoplasm of central portion of left female breast: Secondary | ICD-10-CM | POA: Diagnosis not present

## 2016-06-23 DIAGNOSIS — Z170 Estrogen receptor positive status [ER+]: Secondary | ICD-10-CM | POA: Diagnosis not present

## 2016-06-23 DIAGNOSIS — Z510 Encounter for antineoplastic radiation therapy: Secondary | ICD-10-CM | POA: Diagnosis not present

## 2016-06-24 DIAGNOSIS — Z170 Estrogen receptor positive status [ER+]: Secondary | ICD-10-CM | POA: Diagnosis not present

## 2016-06-24 DIAGNOSIS — C50112 Malignant neoplasm of central portion of left female breast: Secondary | ICD-10-CM | POA: Diagnosis not present

## 2016-06-24 DIAGNOSIS — Z510 Encounter for antineoplastic radiation therapy: Secondary | ICD-10-CM | POA: Diagnosis not present

## 2016-06-25 DIAGNOSIS — Z170 Estrogen receptor positive status [ER+]: Secondary | ICD-10-CM | POA: Diagnosis not present

## 2016-06-25 DIAGNOSIS — C50112 Malignant neoplasm of central portion of left female breast: Secondary | ICD-10-CM | POA: Diagnosis not present

## 2016-06-25 DIAGNOSIS — Z510 Encounter for antineoplastic radiation therapy: Secondary | ICD-10-CM | POA: Diagnosis not present

## 2016-06-26 DIAGNOSIS — Z170 Estrogen receptor positive status [ER+]: Secondary | ICD-10-CM | POA: Diagnosis not present

## 2016-06-26 DIAGNOSIS — C50112 Malignant neoplasm of central portion of left female breast: Secondary | ICD-10-CM | POA: Diagnosis not present

## 2016-06-26 DIAGNOSIS — Z510 Encounter for antineoplastic radiation therapy: Secondary | ICD-10-CM | POA: Diagnosis not present

## 2016-06-27 DIAGNOSIS — Z510 Encounter for antineoplastic radiation therapy: Secondary | ICD-10-CM | POA: Diagnosis not present

## 2016-06-27 DIAGNOSIS — Z170 Estrogen receptor positive status [ER+]: Secondary | ICD-10-CM | POA: Diagnosis not present

## 2016-06-27 DIAGNOSIS — C50112 Malignant neoplasm of central portion of left female breast: Secondary | ICD-10-CM | POA: Diagnosis not present

## 2016-06-30 DIAGNOSIS — Z170 Estrogen receptor positive status [ER+]: Secondary | ICD-10-CM | POA: Diagnosis not present

## 2016-06-30 DIAGNOSIS — C50112 Malignant neoplasm of central portion of left female breast: Secondary | ICD-10-CM | POA: Diagnosis not present

## 2016-06-30 DIAGNOSIS — Z510 Encounter for antineoplastic radiation therapy: Secondary | ICD-10-CM | POA: Diagnosis not present

## 2016-07-03 DIAGNOSIS — Z170 Estrogen receptor positive status [ER+]: Secondary | ICD-10-CM | POA: Diagnosis not present

## 2016-07-03 DIAGNOSIS — I10 Essential (primary) hypertension: Secondary | ICD-10-CM | POA: Diagnosis not present

## 2016-07-03 DIAGNOSIS — D72810 Lymphocytopenia: Secondary | ICD-10-CM | POA: Insufficient documentation

## 2016-07-03 DIAGNOSIS — T66XXXA Radiation sickness, unspecified, initial encounter: Secondary | ICD-10-CM | POA: Diagnosis not present

## 2016-07-03 DIAGNOSIS — M85861 Other specified disorders of bone density and structure, right lower leg: Secondary | ICD-10-CM | POA: Diagnosis not present

## 2016-07-03 DIAGNOSIS — Z5181 Encounter for therapeutic drug level monitoring: Secondary | ICD-10-CM | POA: Diagnosis not present

## 2016-07-03 DIAGNOSIS — D0502 Lobular carcinoma in situ of left breast: Secondary | ICD-10-CM | POA: Diagnosis not present

## 2016-07-03 DIAGNOSIS — C50112 Malignant neoplasm of central portion of left female breast: Secondary | ICD-10-CM | POA: Diagnosis not present

## 2016-07-03 DIAGNOSIS — M85862 Other specified disorders of bone density and structure, left lower leg: Secondary | ICD-10-CM | POA: Diagnosis not present

## 2016-07-03 DIAGNOSIS — Z923 Personal history of irradiation: Secondary | ICD-10-CM | POA: Diagnosis not present

## 2016-07-03 DIAGNOSIS — D500 Iron deficiency anemia secondary to blood loss (chronic): Secondary | ICD-10-CM | POA: Diagnosis not present

## 2016-07-03 DIAGNOSIS — Z79811 Long term (current) use of aromatase inhibitors: Secondary | ICD-10-CM | POA: Diagnosis not present

## 2016-07-03 DIAGNOSIS — Z9889 Other specified postprocedural states: Secondary | ICD-10-CM | POA: Diagnosis not present

## 2016-07-03 DIAGNOSIS — H8103 Meniere's disease, bilateral: Secondary | ICD-10-CM | POA: Diagnosis not present

## 2016-07-03 DIAGNOSIS — B009 Herpesviral infection, unspecified: Secondary | ICD-10-CM | POA: Diagnosis not present

## 2016-07-03 DIAGNOSIS — J45909 Unspecified asthma, uncomplicated: Secondary | ICD-10-CM | POA: Diagnosis not present

## 2016-07-03 DIAGNOSIS — J4530 Mild persistent asthma, uncomplicated: Secondary | ICD-10-CM | POA: Diagnosis not present

## 2016-07-03 DIAGNOSIS — M8580 Other specified disorders of bone density and structure, unspecified site: Secondary | ICD-10-CM | POA: Diagnosis not present

## 2016-08-06 DIAGNOSIS — M85862 Other specified disorders of bone density and structure, left lower leg: Secondary | ICD-10-CM | POA: Diagnosis not present

## 2016-08-06 DIAGNOSIS — D500 Iron deficiency anemia secondary to blood loss (chronic): Secondary | ICD-10-CM | POA: Diagnosis not present

## 2016-08-06 DIAGNOSIS — M85861 Other specified disorders of bone density and structure, right lower leg: Secondary | ICD-10-CM | POA: Diagnosis not present

## 2016-08-15 ENCOUNTER — Other Ambulatory Visit: Payer: Self-pay | Admitting: Family Medicine

## 2016-08-20 ENCOUNTER — Telehealth: Payer: Self-pay

## 2016-08-20 NOTE — Telephone Encounter (Signed)
Notified patient.

## 2016-08-20 NOTE — Telephone Encounter (Signed)
Pt is asking for advise on something to take for allergies that is not going to cause problems with other medications she is taking.  Please advise.

## 2016-08-20 NOTE — Telephone Encounter (Signed)
Plain Allegra 180mg  without the decongestant is good.

## 2016-08-26 ENCOUNTER — Ambulatory Visit: Payer: Medicare Other | Admitting: Family Medicine

## 2016-08-26 ENCOUNTER — Encounter: Payer: Self-pay | Admitting: Family Medicine

## 2016-08-26 ENCOUNTER — Ambulatory Visit (INDEPENDENT_AMBULATORY_CARE_PROVIDER_SITE_OTHER): Payer: Medicare Other | Admitting: Family Medicine

## 2016-08-26 VITALS — BP 144/73 | HR 70 | Wt 152.0 lb

## 2016-08-26 DIAGNOSIS — F325 Major depressive disorder, single episode, in full remission: Secondary | ICD-10-CM | POA: Diagnosis not present

## 2016-08-26 DIAGNOSIS — L570 Actinic keratosis: Secondary | ICD-10-CM | POA: Diagnosis not present

## 2016-08-26 DIAGNOSIS — L821 Other seborrheic keratosis: Secondary | ICD-10-CM

## 2016-08-26 NOTE — Progress Notes (Signed)
Subjective:    CC:   HPI:  F/U depression -She's really doing very well overall. She's currently on citalopram 20 mg daily without any side effects or problems. She was recently diagnosed with breast cancer and is is doing very well. She is starting to get her energy level back and is feeling better. She does complain of little interest in pleasure doing things several days a week but otherwise doing well.  He also has a lesion on the top of her foot she would like me to look at today. It's on the dorsum of the right foot. She says is not painful or irritating it has been there for several months. She said it started initially as a little red bump and now it's a little bit bigger.  The has some lesions on her sides as well that she would like me to look at. Again these are not bothersome. She says her sister had a lot of them as well.     Past medical history, Surgical history, Family history not pertinant except as noted below, Social history, Allergies, and medications have been entered into the medical record, reviewed, and corrections made.   Review of Systems: No fevers, chills, night sweats, weight loss, chest pain, or shortness of breath.   Objective:    General: Well Developed, well nourished, and in no acute distress.  Neuro: Alert and oriented x3, extra-ocular muscles intact, sensation grossly intact.  HEENT: Normocephalic, atraumatic  Skin: Warm and dry. 8 x 9 mm round erythematous scaling lesion on dorsum or right foot near 4th and 5th toes. She has multiple seborrheic keratoses on her truck, back, and below axilla bilaterally.   Cardiac: Regular rate and rhythm, no murmurs rubs or gallops, no lower extremity edema.  Respiratory: Clear to auscultation bilaterally. Not using accessory muscles, speaking in full sentences.   Impression and Recommendations:   Depression-PHQ 9 score of 2 today and GAD 7 score of 0. Continue current regimen with citalopram. She does not want to  wean the medication at this point would like to continue with her current regimen.  Actinic keratoses-we'll treat with cryotherapy.   Several seborrheic keratoses on the torso-treated with cryotherapy as well.  Cryotherapy Procedure Note  Pre-operative Diagnosis: AK on right dorsum foot and mulptile (12) seborrheic keraotoses   Post-operative Diagnosis: same   Locations: dorsum or right foot is the erythematous lesion and several ( 12 seb ke lesion on torso.   Indications: irritation and precancerous  Anesthesia: not indicated.   Procedure Details  Patient informed of risks (permanent scarring, infection, light or dark discoloration, bleeding, infection, weakness, numbness and recurrence of the lesion) and benefits of the procedure and verbal informed consent obtained.  The areas are treated with liquid nitrogen therapy, frozen until ice ball extended 1-2 mm beyond lesion, allowed to thaw, and treated again. The patient tolerated procedure well.  The patient was instructed on post-op care, warned that there may be blister formation, redness and pain. Recommend OTC analgesia as needed for pain.  Condition: Stable  Complications: None  Plan: 1. Instructed to keep the area dry and covered for 24-48h and clean thereafter. 2. Warning signs of infection were reviewed.   3. Recommended that the patient use tylenol as needed for pain.  4. Return PRN.

## 2016-10-19 ENCOUNTER — Other Ambulatory Visit: Payer: Self-pay | Admitting: Family Medicine

## 2016-10-21 ENCOUNTER — Other Ambulatory Visit: Payer: Self-pay | Admitting: Family Medicine

## 2016-11-14 DIAGNOSIS — M85862 Other specified disorders of bone density and structure, left lower leg: Secondary | ICD-10-CM | POA: Diagnosis not present

## 2016-11-14 DIAGNOSIS — Z5181 Encounter for therapeutic drug level monitoring: Secondary | ICD-10-CM | POA: Diagnosis not present

## 2016-11-14 DIAGNOSIS — Z9889 Other specified postprocedural states: Secondary | ICD-10-CM | POA: Diagnosis not present

## 2016-11-14 DIAGNOSIS — F330 Major depressive disorder, recurrent, mild: Secondary | ICD-10-CM | POA: Diagnosis not present

## 2016-11-14 DIAGNOSIS — Z923 Personal history of irradiation: Secondary | ICD-10-CM | POA: Diagnosis not present

## 2016-11-14 DIAGNOSIS — Z79811 Long term (current) use of aromatase inhibitors: Secondary | ICD-10-CM | POA: Diagnosis not present

## 2016-11-14 DIAGNOSIS — C50112 Malignant neoplasm of central portion of left female breast: Secondary | ICD-10-CM | POA: Diagnosis not present

## 2016-11-14 DIAGNOSIS — D500 Iron deficiency anemia secondary to blood loss (chronic): Secondary | ICD-10-CM | POA: Diagnosis not present

## 2016-11-14 DIAGNOSIS — D72810 Lymphocytopenia: Secondary | ICD-10-CM | POA: Diagnosis not present

## 2016-11-14 DIAGNOSIS — I10 Essential (primary) hypertension: Secondary | ICD-10-CM | POA: Diagnosis not present

## 2016-11-14 DIAGNOSIS — M85861 Other specified disorders of bone density and structure, right lower leg: Secondary | ICD-10-CM | POA: Diagnosis not present

## 2016-11-14 DIAGNOSIS — Z79899 Other long term (current) drug therapy: Secondary | ICD-10-CM | POA: Diagnosis not present

## 2016-11-14 DIAGNOSIS — Z170 Estrogen receptor positive status [ER+]: Secondary | ICD-10-CM | POA: Diagnosis not present

## 2016-11-14 DIAGNOSIS — H8109 Meniere's disease, unspecified ear: Secondary | ICD-10-CM | POA: Diagnosis not present

## 2016-11-14 DIAGNOSIS — Z79810 Long term (current) use of selective estrogen receptor modulators (SERMs): Secondary | ICD-10-CM | POA: Diagnosis not present

## 2016-12-01 ENCOUNTER — Telehealth: Payer: Self-pay

## 2016-12-01 DIAGNOSIS — Z862 Personal history of diseases of the blood and blood-forming organs and certain disorders involving the immune mechanism: Secondary | ICD-10-CM

## 2016-12-01 DIAGNOSIS — D649 Anemia, unspecified: Secondary | ICD-10-CM

## 2016-12-01 DIAGNOSIS — R5383 Other fatigue: Secondary | ICD-10-CM

## 2016-12-01 NOTE — Telephone Encounter (Signed)
Is she taking any iron?  OK to recheck CBC, ferritin, iron

## 2016-12-01 NOTE — Telephone Encounter (Signed)
Pt called stating she is feeling fatigued, lethargic and craving ice again. Would like to know if she can have labs ran again to check levels. Please advise.

## 2016-12-01 NOTE — Telephone Encounter (Signed)
Labs ordered. Pt states she is not taking iron at this time.

## 2016-12-04 DIAGNOSIS — D649 Anemia, unspecified: Secondary | ICD-10-CM | POA: Diagnosis not present

## 2016-12-04 DIAGNOSIS — R5383 Other fatigue: Secondary | ICD-10-CM | POA: Diagnosis not present

## 2016-12-04 DIAGNOSIS — Z862 Personal history of diseases of the blood and blood-forming organs and certain disorders involving the immune mechanism: Secondary | ICD-10-CM | POA: Diagnosis not present

## 2016-12-04 LAB — CBC
HCT: 33.1 % — ABNORMAL LOW (ref 35.0–45.0)
Hemoglobin: 10.1 g/dL — ABNORMAL LOW (ref 11.7–15.5)
MCH: 24.9 pg — ABNORMAL LOW (ref 27.0–33.0)
MCHC: 30.5 g/dL — AB (ref 32.0–36.0)
MCV: 81.5 fL (ref 80.0–100.0)
MPV: 8.1 fL (ref 7.5–12.5)
PLATELETS: 381 10*3/uL (ref 140–400)
RBC: 4.06 MIL/uL (ref 3.80–5.10)
RDW: 16 % — AB (ref 11.0–15.0)
WBC: 5.2 10*3/uL (ref 3.8–10.8)

## 2016-12-05 LAB — IRON: IRON: 36 ug/dL — AB (ref 45–160)

## 2016-12-05 LAB — FERRITIN: FERRITIN: 5 ng/mL — AB (ref 20–288)

## 2016-12-10 ENCOUNTER — Ambulatory Visit (INDEPENDENT_AMBULATORY_CARE_PROVIDER_SITE_OTHER): Payer: Medicare Other | Admitting: Osteopathic Medicine

## 2016-12-10 ENCOUNTER — Encounter: Payer: Self-pay | Admitting: Osteopathic Medicine

## 2016-12-10 VITALS — BP 149/81 | HR 74 | Ht 62.0 in | Wt 151.0 lb

## 2016-12-10 DIAGNOSIS — R1030 Lower abdominal pain, unspecified: Secondary | ICD-10-CM | POA: Diagnosis not present

## 2016-12-10 DIAGNOSIS — N898 Other specified noninflammatory disorders of vagina: Secondary | ICD-10-CM | POA: Diagnosis not present

## 2016-12-10 LAB — POCT URINALYSIS DIPSTICK
Bilirubin, UA: NEGATIVE
Blood, UA: NEGATIVE
Glucose, UA: NEGATIVE
Ketones, UA: NEGATIVE
LEUKOCYTES UA: NEGATIVE
Nitrite, UA: NEGATIVE
PROTEIN UA: NEGATIVE
Spec Grav, UA: 1.015 (ref 1.010–1.025)
UROBILINOGEN UA: 0.2 U/dL
pH, UA: 8 (ref 5.0–8.0)

## 2016-12-10 LAB — WET PREP FOR TRICH, YEAST, CLUE
Clue Cells Wet Prep HPF POC: NONE SEEN
Trich, Wet Prep: NONE SEEN
Yeast Wet Prep HPF POC: NONE SEEN

## 2016-12-10 MED ORDER — METRONIDAZOLE 500 MG PO TABS: 500.0000 mg | ORAL_TABLET | Freq: Two times a day (BID) | ORAL | 0 refills | Status: DC

## 2016-12-10 MED ORDER — FLUCONAZOLE 150 MG PO TABS: 150.0000 mg | ORAL_TABLET | Freq: Once | ORAL | 1 refills | Status: AC

## 2016-12-10 NOTE — Progress Notes (Signed)
HPI: [name] is a 66 y.o. female who presents to [facility] today for chief complaint of:  Chief Complaint  Patient presents with  . Vaginal Discharge    . Location: vaginal but pt concerned about urethral . Quality: milky white  . Duration: 2 days . Context: recent bike ride for several hours and has noticed a bit more redness/itching . Modifying factors: Has tried no medications. . Assoc signs/symptoms: vague lower abdominal cramping without diarrhea/constipation   Past medical, social, surgical and family history reviewed. Current medications and allergies reviewed.   Review of Systems: CONSTITUTIONAL:  No  fever, no chills, No  unintentional weight changes CARDIAC: No  chest pain RESPIRATORY: No  cough, No  shortness of breath GASTROINTESTINAL: No  nausea, No  vomiting, +abdominal pain, No  blood in stool, No  diarrhea, No  constipation  GENITOURINARY: No  incontinence, No urinary pain, no urinary frequency, No  abnormal genital bleeding, +abnormal genital discharge SKIN: No  rash/wounds/concerning lesions in genital area HEM/ONC: No  abnormal lymph node   Exam:  BP (!) 149/81   Pulse 74   Ht 5\' 2"  (1.575 m)   Wt 151 lb (68.5 kg)   BMI 27.62 kg/m  Constitutional: VS see above. General Appearance: alert, well-developed, well-nourished, NAD Respiratory: Normal respiratory effort. Cardiovascular: S1/S2 normal, no murmur, no rub/gallop auscultated. RRR.  Gastrointestinal: Nontender, no masses.  Skin: warm, dry, intact. No rash/ulcer. No concerning nevi or subq nodules on limited exam.   GYN: No lesions/ulcers to external genitalia, normal urethra, normal vaginal mucosa, thin whitish discharge, cervix normal without lesions, uterus not enlarged or tender, adnexa no masses and nontender    Results for orders placed or performed in visit on 12/10/16 (from the past 72 hour(s))  POCT urinalysis dipstick     Status: None    Collection Time: 12/10/16 11:07 AM  Result Value Ref Range   Color, UA light yellow    Clarity, UA slightly cloudy    Glucose, UA neg    Bilirubin, UA neg    Ketones, UA neg    Spec Grav, UA 1.015 1.010 - 1.025   Blood, UA neg    pH, UA 8.0 5.0 - 8.0   Protein, UA neg    Urobilinogen, UA 0.2 0.2 or 1.0 E.U./dL   Nitrite, UA neg    Leukocytes, UA Negative Negative       ASSESSMENT/PLAN:  Vaginal discharge - More suspicious for mild yeast or BV infection  - Plan: fluconazole (DIFLUCAN) 150 MG tablet, metroNIDAZOLE (FLAGYL) 500 MG tablet, WET PREP FOR TRICH, YEAST, CLUE, Urine Culture, CANCELED: Urine Culture  Lower abdominal pain - seems more likely MSK than GI/Uterine but consider imaging/re-exam if no better  - Plan: POCT urinalysis dipstick, Urine Culture, CANCELED: Urine Culture   Patient Instructions  Plan: I think this is more a yeast infection or bacterial vaginosis - I have sent in medications to treat this. We will await urine culture as well to rule out urinary infection  Any feeling worse, let me know!   Return if symptoms worsen or fail to improve.

## 2016-12-10 NOTE — Patient Instructions (Signed)
Plan: I think this is more a yeast infection or bacterial vaginosis - I have sent in medications to treat this. We will await urine culture as well to rule out urinary infection  Any feeling worse, let me know!

## 2016-12-11 LAB — URINE CULTURE

## 2016-12-12 ENCOUNTER — Telehealth: Payer: Self-pay

## 2016-12-15 ENCOUNTER — Other Ambulatory Visit: Payer: Self-pay | Admitting: Osteopathic Medicine

## 2016-12-15 MED ORDER — METRONIDAZOLE 0.75 % VA GEL: 1.0000 | Freq: Every day | VAGINAL | 0 refills | Status: DC

## 2016-12-15 NOTE — Progress Notes (Signed)
Pt advised, verbalized understanding. No further questions.  

## 2016-12-15 NOTE — Progress Notes (Signed)
Gel vs tablet for BV sent to local pharmacy on file [facility] on [address]

## 2016-12-20 ENCOUNTER — Other Ambulatory Visit: Payer: Self-pay | Admitting: Family Medicine

## 2016-12-26 DIAGNOSIS — Z1211 Encounter for screening for malignant neoplasm of colon: Secondary | ICD-10-CM | POA: Diagnosis not present

## 2016-12-26 DIAGNOSIS — D123 Benign neoplasm of transverse colon: Secondary | ICD-10-CM | POA: Diagnosis not present

## 2016-12-26 LAB — HM COLONOSCOPY

## 2017-01-02 DIAGNOSIS — Z86010 Personal history of colonic polyps: Secondary | ICD-10-CM | POA: Insufficient documentation

## 2017-01-21 ENCOUNTER — Encounter: Payer: Self-pay | Admitting: Family Medicine

## 2017-02-03 ENCOUNTER — Telehealth: Payer: Self-pay | Admitting: *Deleted

## 2017-02-03 DIAGNOSIS — Z1283 Encounter for screening for malignant neoplasm of skin: Secondary | ICD-10-CM

## 2017-02-03 NOTE — Telephone Encounter (Signed)
Ok to place referral to [facility] in [place].  Request female provider.

## 2017-02-03 NOTE — Telephone Encounter (Signed)
Pt called asking for a referral for a female dermatologist for either [place] or [place]. She stated that Dr. Arlana Lindau had did a skin scraping for her and also some cryotherapy. She has also had a punch bx she stated that she has itchy nipples and this is one of her concerns (?precancerous) along with the places on her face and back. Will fwd to pcp for advice.Audelia Hives [place]

## 2017-02-17 DIAGNOSIS — L579 Skin changes due to chronic exposure to nonionizing radiation, unspecified: Secondary | ICD-10-CM | POA: Diagnosis not present

## 2017-02-17 DIAGNOSIS — L814 Other melanin hyperpigmentation: Secondary | ICD-10-CM | POA: Diagnosis not present

## 2017-02-17 DIAGNOSIS — L249 Irritant contact dermatitis, unspecified cause: Secondary | ICD-10-CM | POA: Diagnosis not present

## 2017-02-17 DIAGNOSIS — L821 Other seborrheic keratosis: Secondary | ICD-10-CM | POA: Diagnosis not present

## 2017-02-17 DIAGNOSIS — L570 Actinic keratosis: Secondary | ICD-10-CM | POA: Diagnosis not present

## 2017-02-19 DIAGNOSIS — Z923 Personal history of irradiation: Secondary | ICD-10-CM | POA: Diagnosis not present

## 2017-02-19 DIAGNOSIS — Z08 Encounter for follow-up examination after completed treatment for malignant neoplasm: Secondary | ICD-10-CM | POA: Diagnosis not present

## 2017-02-19 DIAGNOSIS — Z5181 Encounter for therapeutic drug level monitoring: Secondary | ICD-10-CM | POA: Diagnosis not present

## 2017-02-19 DIAGNOSIS — D500 Iron deficiency anemia secondary to blood loss (chronic): Secondary | ICD-10-CM | POA: Diagnosis not present

## 2017-02-19 DIAGNOSIS — C50112 Malignant neoplasm of central portion of left female breast: Secondary | ICD-10-CM | POA: Diagnosis not present

## 2017-02-19 DIAGNOSIS — M85861 Other specified disorders of bone density and structure, right lower leg: Secondary | ICD-10-CM | POA: Diagnosis not present

## 2017-02-19 DIAGNOSIS — M85862 Other specified disorders of bone density and structure, left lower leg: Secondary | ICD-10-CM | POA: Diagnosis not present

## 2017-02-19 DIAGNOSIS — Z853 Personal history of malignant neoplasm of breast: Secondary | ICD-10-CM | POA: Diagnosis not present

## 2017-02-19 DIAGNOSIS — Z9221 Personal history of antineoplastic chemotherapy: Secondary | ICD-10-CM | POA: Diagnosis not present

## 2017-02-19 DIAGNOSIS — M8589 Other specified disorders of bone density and structure, multiple sites: Secondary | ICD-10-CM | POA: Diagnosis not present

## 2017-02-19 DIAGNOSIS — Z86000 Personal history of in-situ neoplasm of breast: Secondary | ICD-10-CM | POA: Diagnosis not present

## 2017-02-19 DIAGNOSIS — Z9189 Other specified personal risk factors, not elsewhere classified: Secondary | ICD-10-CM | POA: Diagnosis not present

## 2017-02-19 DIAGNOSIS — Z170 Estrogen receptor positive status [ER+]: Secondary | ICD-10-CM | POA: Diagnosis not present

## 2017-02-26 ENCOUNTER — Ambulatory Visit: Payer: Medicare Other | Admitting: Family Medicine

## 2017-03-10 NOTE — Telephone Encounter (Signed)
error 

## 2017-04-14 DIAGNOSIS — Z170 Estrogen receptor positive status [ER+]: Secondary | ICD-10-CM | POA: Diagnosis not present

## 2017-04-14 DIAGNOSIS — N6489 Other specified disorders of breast: Secondary | ICD-10-CM | POA: Diagnosis not present

## 2017-04-14 DIAGNOSIS — C50112 Malignant neoplasm of central portion of left female breast: Secondary | ICD-10-CM | POA: Diagnosis not present

## 2017-04-14 LAB — HM MAMMOGRAPHY

## 2017-04-29 ENCOUNTER — Other Ambulatory Visit: Payer: Self-pay | Admitting: Family Medicine

## 2017-05-11 ENCOUNTER — Emergency Department (INDEPENDENT_AMBULATORY_CARE_PROVIDER_SITE_OTHER)
Admission: EM | Admit: 2017-05-11 | Discharge: 2017-05-11 | Disposition: A | Discharge status: Home / Self Care | Payer: Medicare Other | Source: Home / Self Care | Attending: Family Medicine | Admitting: Family Medicine

## 2017-05-11 ENCOUNTER — Other Ambulatory Visit: Payer: Self-pay

## 2017-05-11 DIAGNOSIS — J019 Acute sinusitis, unspecified: Secondary | ICD-10-CM

## 2017-05-11 MED ORDER — FLUTICASONE PROPIONATE 50 MCG/ACT NA SUSP: 2.0000 | Freq: Every day | NASAL | 2 refills | Status: DC

## 2017-05-11 MED ORDER — AMOXICILLIN 500 MG PO CAPS: 500.0000 mg | ORAL_CAPSULE | Freq: Three times a day (TID) | ORAL | 0 refills | Status: DC

## 2017-05-11 NOTE — ED Provider Notes (Signed)
[name]    CSN: [id number] Arrival date & time: 05/11/17  1114     History   Chief Complaint Chief Complaint  Patient presents with  . Facial Pain    pressure    HPI [name] is a 66 y.o. female.   HPI [name] is a 66 y.o. female presenting to UC with c/o 8 days of worsening sinus congestion, facial pain and pressure with sore throat and minimal productive cough.  She has developed yellow nasal discharge yesterday and today. She is concerned she has a sinus infection. Hx of similar symptoms in the past. Denies fever, chills, n/v/d. She has tried OTC mucinex with minimal relief.    Past Medical History:  Diagnosis Date  . HSV (herpes simplex virus) anogenital infection   . Meniere's disease   . Migraine with visual aura    optical  . Postmenopausal   . Volvulus ([place])     Patient Active Problem List   Diagnosis Date Noted  . First degree ankle sprain, right, initial encounter 03/25/2016  . Meniere disease 08/04/2014  . Left lumbar radiculitis 04/11/2013  . Gastric ulcer 02/10/2011  . Hiatal hernia 02/07/2011  . OSTEOPENIA 12/02/2006  . HYPERLIPIDEMIA 10/20/2006  . HSV 06/16/2006  . Major depressive disorder, recurrent episode ([place]) 02/24/2006  . MIGRAINE, UNSPEC., W/O INTRACTABLE MIGRAINE 02/24/2006  . Asthma, moderate persistent 02/24/2006  . MENOPAUSAL SYNDROME 02/24/2006    Past Surgical History:  Procedure Laterality Date  . COLON SURGERY  2003   resection/small bowel ischemia  . ESOPHAGEAL DILATION    . HERNIA REPAIR    . LTCS    . OVARIAN CYST REMOVAL    . SMALL INTESTINE SURGERY  02/2010  . TYMPANOSTOMY TUBE PLACEMENT     placement RT, out now.     OB History    No data available       Home Medications    Prior to Admission medications   Medication Sig Start Date End Date Taking? Authorizing Provider  amoxicillin (AMOXIL) 500 MG capsule Take 1 capsule (500 mg total) by mouth 3 (three) times  daily. 05/11/17   Noe Gens, PA-C  citalopram (CELEXA) 20 MG tablet TAKE 1 TABLET (20 MG TOTAL) BY MOUTH DAILY. 12/22/16   Hali Marry, MD  denosumab (PROLIA) 60 MG/ML SOLN injection Inject 60 mg into the skin every 6 (six) months. Administer in upper arm, thigh, or abdomen    [provider]  fluticasone (FLONASE) 50 MCG/ACT nasal spray Place 2 sprays into both nostrils daily. 05/11/17   Noe Gens, PA-C  metroNIDAZOLE (METROGEL) 0.75 % vaginal gel Place 1 Applicatorful vaginally at bedtime. For 5 days 12/15/16   Emeterio Reeve, DO  montelukast (SINGULAIR) 10 MG tablet TAKE 1 TABLET (10 MG TOTAL) BY MOUTH AT BEDTIME. 04/29/17   Hali Marry, MD  nystatin-triamcinolone ointment [facility]) Apply 1 application topically 2 (two) times daily. 02/26/16   Hali Marry, MD  Ospemifene (OSPHENA) 60 MG TABS Take 60 mg by mouth daily. 03/05/16   Hali Marry, MD  SUMAtriptan (IMITREX) 100 MG tablet Take 1 tablet (100 mg total) by mouth every 2 (two) hours as needed. 11/25/11   Hali Marry, MD  triamterene-hydrochlorothiazide (MAXZIDE) 75-50 MG tablet Take 1 tablet by mouth daily. 12/22/16   Hali Marry, MD  valACYclovir (VALTREX) 1000 MG tablet TAKE 1 TABLET (1,000 MG TOTAL) BY MOUTH 2 (TWO) TIMES DAILY. (1/2) TAB TWICE DAILY. 10/20/16  Hali Marry, MD  VENTOLIN HFA 108 [phone] Base) MCG/ACT inhaler INHALE 2 PUFFS INTO THE LUNGS EVERY 6 (SIX) HOURS AS NEEDED. 10/22/16   Hali Marry, MD    Family History Family History  Problem Relation Age of Onset  . Hypertension Mother   . Hypertension Father   . Depression Father   . Glaucoma Father   . Parkinsonism Father   . Depression Sister   . Depression Sister     Social History Social History   Tobacco Use  . Smoking status: Never Smoker  . Smokeless tobacco: Never Used  Substance Use Topics  . Alcohol use: Yes    Alcohol/week: 1.8 oz    Types: 3 Glasses of wine per  week    Comment: per week  . Drug use: No     Allergies   Codeine   Review of Systems Review of Systems  Constitutional: Negative for chills and fever.  HENT: Positive for congestion, ear pain (bilateral pressure), postnasal drip, sinus pressure and sinus pain. Negative for facial swelling, sore throat, trouble swallowing and voice change.   Respiratory: Positive for cough. Negative for shortness of breath.   Cardiovascular: Negative for chest pain and palpitations.  Gastrointestinal: Negative for abdominal pain, diarrhea, nausea and vomiting.  Musculoskeletal: Negative for arthralgias, back pain and myalgias.  Skin: Negative for rash.  Neurological: Positive for headaches (facial). Negative for dizziness and light-headedness.     Physical Exam Triage Vital Signs ED Triage Vitals  Enc Vitals Group     BP 05/11/17 1138 (!) 161/88     Pulse Rate 05/11/17 1138 75     Resp --      Temp 05/11/17 1138 98 F (36.7 C)     Temp Source 05/11/17 1138 Oral     SpO2 05/11/17 1138 100 %     Weight 05/11/17 1139 146 lb (66.2 kg)     Height 05/11/17 1139 5\' 2"  (1.575 m)     Head Circumference --      Peak Flow --      Pain Score 05/11/17 1139 0     Pain Loc --      Pain Edu? --      Excl. in [place]? --    No data found.  Updated Vital Signs BP (!) 161/88 (BP Location: Right Arm)   Pulse 75   Temp 98 F (36.7 C) (Oral)   Ht 5\' 2"  (1.575 m)   Wt 146 lb (66.2 kg)   SpO2 100%   BMI 26.70 kg/m   Visual Acuity Right Eye Distance:   Left Eye Distance:   Bilateral Distance:    Right Eye Near:   Left Eye Near:    Bilateral Near:     Physical Exam  Constitutional: She is oriented to person, place, and time. She appears well-developed and well-nourished. No distress.  HENT:  Head: Normocephalic and atraumatic.  Right Ear: Tympanic membrane normal.  Left Ear: Tympanic membrane normal.  Nose: Mucosal edema present. Right sinus exhibits maxillary sinus tenderness and frontal  sinus tenderness. Left sinus exhibits maxillary sinus tenderness and frontal sinus tenderness.  Mouth/Throat: Uvula is midline, oropharynx is clear and moist and mucous membranes are normal.  Eyes: EOM are normal.  Neck: Normal range of motion. Neck supple.  Cardiovascular: Normal rate and regular rhythm.  Pulmonary/Chest: Effort normal and breath sounds normal. No stridor. No respiratory distress. She has no wheezes. She has no rales.  Musculoskeletal: Normal range of motion.  Lymphadenopathy:    She has no cervical adenopathy.  Neurological: She is alert and oriented to person, place, and time.  Skin: Skin is warm and dry. She is not diaphoretic.  Psychiatric: She has a normal mood and affect. Her behavior is normal.  Nursing note and vitals reviewed.    UC Treatments / Results  Labs (all labs ordered are listed, but only abnormal results are displayed) Labs Reviewed - No data to display  EKG  EKG Interpretation None       Radiology No results found.  Procedures Procedures (including critical care time)  Medications Ordered in UC Medications - No data to display   Initial Impression / Assessment and Plan / UC Course  I have reviewed the triage vital signs and the nursing notes.  Pertinent labs & imaging results that were available during my care of the patient were reviewed by me and considered in my medical decision making (see chart for details).     Hx and exam c/w sinusitis Will start on amoxicillin encouraged sinus rinses F/u with PCP in 1 week if not improving.   Final Clinical Impressions(s) / UC Diagnoses   Final diagnoses:  Acute rhinosinusitis    ED Discharge Orders        Ordered    amoxicillin (AMOXIL) 500 MG capsule  3 times daily     05/11/17 1203    fluticasone (FLONASE) 50 MCG/ACT nasal spray  Daily     05/11/17 1203       Controlled Substance Prescriptions [place] Controlled Substance Registry consulted? Not Applicable   Tyrell Antonio 05/11/17 1207

## 2017-05-11 NOTE — Discharge Instructions (Signed)
°  You may take 500mg  acetaminophen every 4-6 hours or in combination with ibuprofen 400-600mg  every 6-8 hours as needed for pain, inflammation, and fever.  Be sure to drink at least eight 8oz glasses of water to stay well hydrated and get at least 8 hours of sleep at night, preferably more while sick.   Please take antibiotics as prescribed and be sure to complete entire course even if you start to feel better to ensure infection does not come back.  To help prevent stomach upset, but sure to take with a meal or 30 minutes after eating a meal. You may also take over the counter probiotics or eat yogurt such as Activia with probiotics.

## 2017-05-11 NOTE — ED Triage Notes (Signed)
Started 8 days ago with a sore throat and cough, has progressively became worse.  Sneezing, congestion, Dk yellow mucous, facial pressure.

## 2017-05-14 ENCOUNTER — Telehealth: Payer: Self-pay

## 2017-05-14 NOTE — Telephone Encounter (Signed)
Left message to call UC if any questions or concerns.  Contact information given.

## 2017-05-25 ENCOUNTER — Ambulatory Visit (INDEPENDENT_AMBULATORY_CARE_PROVIDER_SITE_OTHER): Payer: Medicare Other

## 2017-05-25 ENCOUNTER — Ambulatory Visit (INDEPENDENT_AMBULATORY_CARE_PROVIDER_SITE_OTHER): Payer: Medicare Other | Admitting: Physician Assistant

## 2017-05-25 ENCOUNTER — Encounter: Payer: Self-pay | Admitting: Physician Assistant

## 2017-05-25 VITALS — BP 164/80 | HR 65 | Temp 97.8°F | Ht 62.0 in | Wt 148.7 lb

## 2017-05-25 DIAGNOSIS — J0100 Acute maxillary sinusitis, unspecified: Secondary | ICD-10-CM | POA: Diagnosis not present

## 2017-05-25 DIAGNOSIS — J4541 Moderate persistent asthma with (acute) exacerbation: Secondary | ICD-10-CM | POA: Diagnosis not present

## 2017-05-25 DIAGNOSIS — J449 Chronic obstructive pulmonary disease, unspecified: Secondary | ICD-10-CM

## 2017-05-25 DIAGNOSIS — R030 Elevated blood-pressure reading, without diagnosis of hypertension: Secondary | ICD-10-CM

## 2017-05-25 DIAGNOSIS — R0602 Shortness of breath: Secondary | ICD-10-CM | POA: Diagnosis not present

## 2017-05-25 DIAGNOSIS — R05 Cough: Secondary | ICD-10-CM | POA: Diagnosis not present

## 2017-05-25 MED ORDER — DOXYCYCLINE HYCLATE 100 MG PO TABS: 100.0000 mg | ORAL_TABLET | Freq: Two times a day (BID) | ORAL | 0 refills | Status: DC

## 2017-05-25 MED ORDER — IPRATROPIUM-ALBUTEROL 0.5-2.5 (3) MG/3ML IN SOLN: 3.0000 mL | Freq: Four times a day (QID) | RESPIRATORY_TRACT | Status: DC

## 2017-05-25 MED ORDER — IPRATROPIUM-ALBUTEROL 0.5-2.5 (3) MG/3ML IN SOLN
3.0000 mL | Freq: Once | RESPIRATORY_TRACT | Status: AC
  Administered 2017-05-25: 3 mL via RESPIRATORY_TRACT

## 2017-05-25 MED ORDER — PREDNISONE 20 MG PO TABS: 40.0000 mg | ORAL_TABLET | Freq: Every day | ORAL | 0 refills | Status: AC

## 2017-05-25 NOTE — Progress Notes (Signed)
HPI:                                                                [name] is a 67 y.o. female who presents to [facility] [place] today for cough and congestion  Sinus Problem  This is a new problem. The current episode started 1 to 4 weeks ago (Dec 17). The problem has been gradually worsening since onset. Associated symptoms include chills, coughing, shortness of breath, sneezing and a sore throat. (+ malaise/fatigue) Treatments tried: Amoxicillin x 10 days. The treatment provided mild relief.  Cough  This is a new problem. The current episode started 1 to 4 weeks ago. The problem has been gradually worsening. The problem occurs hourly. The cough is productive of sputum. Associated symptoms include chills, a sore throat and shortness of breath. Pertinent negatives include no fever, hemoptysis or wheezing. Associated symptoms comments: + chest tightness. Nothing aggravates the symptoms. She has tried a beta-agonist inhaler (using rescue 2-3/day) for the symptoms. Her past medical history is significant for asthma.     Past Medical History:  Diagnosis Date  . Asthma   . Breast cancer ([place])   . HSV (herpes simplex virus) anogenital infection   . Hyperlipidemia   . Hypertension   . Meniere's disease   . Migraine with visual aura    optical  . Postmenopausal   . Volvulus ([place])    Past Surgical History:  Procedure Laterality Date  . COLON SURGERY  2003   resection/small bowel ischemia  . ESOPHAGEAL DILATION    . HERNIA REPAIR    . LTCS    . OVARIAN CYST REMOVAL    . SMALL INTESTINE SURGERY  02/2010  . TYMPANOSTOMY TUBE PLACEMENT     placement RT, out now.    Social History   Tobacco Use  . Smoking status: Never Smoker  . Smokeless tobacco: Never Used  Substance Use Topics  . Alcohol use: Yes    Alcohol/week: 1.8 oz    Types: 3 Glasses of wine per week    Comment: per week   family history includes Depression in her  father, sister, and sister; Glaucoma in her father; Hypertension in her father and mother; Parkinsonism in her father.  ROS: negative except as noted in the HPI  Medications: Current Outpatient Medications  Medication Sig Dispense Refill  . citalopram (CELEXA) 20 MG tablet TAKE 1 TABLET (20 MG TOTAL) BY MOUTH DAILY. 90 tablet 1  . montelukast (SINGULAIR) 10 MG tablet TAKE 1 TABLET (10 MG TOTAL) BY MOUTH AT BEDTIME. 90 tablet 2  . SUMAtriptan (IMITREX) 100 MG tablet Take 1 tablet (100 mg total) by mouth every 2 (two) hours as needed. 10 tablet 1  . tamoxifen (NOLVADEX) 20 MG tablet Take by mouth.    . triamterene-hydrochlorothiazide (MAXZIDE) 75-50 MG tablet Take 1 tablet by mouth daily. 90 tablet 1  . valACYclovir (VALTREX) 1000 MG tablet TAKE 1 TABLET (1,000 MG TOTAL) BY MOUTH 2 (TWO) TIMES DAILY. (1/2) TAB TWICE DAILY. 180 tablet 0  . VENTOLIN HFA 108 (90 Base) MCG/ACT inhaler INHALE 2 PUFFS INTO THE LUNGS EVERY 6 (SIX) HOURS AS NEEDED. 18 Inhaler 2  . azithromycin (ZITHROMAX Z-PAK) 250 MG tablet Take 2 tablets (500  mg) on  Day 1,  followed by 1 tablet (250 mg) once daily on Days 2 through 5. 6 tablet 0  . Cholecalciferol 1000 units CHEW Chew by mouth.    . denosumab (PROLIA) 60 MG/ML SOLN injection Inject 60 mg into the skin every 6 (six) months. Administer in upper arm, thigh, or abdomen     No current facility-administered medications for this visit.    Allergies  Allergen Reactions  . Codeine     REACTION: hyper       Objective:  BP (!) 164/80 (BP Location: Right Arm, Patient Position: Sitting, Cuff Size: Normal)   Pulse 65   Temp 97.8 F (36.6 C) (Oral)   Ht 5\' 2"  (1.575 m)   Wt 148 lb 11.2 oz (67.4 kg)   SpO2 96%   BMI 27.20 kg/m  Gen:  alert, not ill-appearing, no distress, appropriate for age HEENT: head normocephalic without obvious abnormality, conjunctiva and cornea clear, trachea midline Pulm: Normal work of breathing, normal phonation, breath sounds are  diffusely coarse, expiratory wheezes CV: Normal rate, regular rhythm, s1 and s2 distinct, no murmurs, clicks or rubs  Neuro: alert and oriented x 3, no tremor MSK: extremities atraumatic, normal gait and station Skin: intact, no rashes on exposed skin, no jaundice, no cyanosis Psych: well-groomed, cooperative, good eye contact, euthymic mood, affect mood-congruent, speech is articulate, and thought processes clear and goal-directed  No results found for this or any previous visit (from the past 72 hour(s)). No results found.    Assessment and Plan: 67 y.o. female with   1. Moderate persistent asthma with acute exacerbation - SpO2 96% on RA at rest, no evidence of respiratory distress. Prednisone and Doxycycline for exacerbation. Duoneb given in office. Continue Albuterol 2 puffs Q4h as needed - DG Chest 2 View to assess for infiltrate - predniSONE (DELTASONE) 20 MG tablet; Take 2 tablets (40 mg total) by mouth daily with breakfast for 5 days.  Dispense: 10 tablet; Refill: 0 - ipratropium-albuterol (DUONEB) 0.5-2.5 (3) MG/3ML nebulizer solution 3 mL  2. Acute non-recurrent maxillary sinusitis - Doxycycline 100 mg bid x 7 days  3. Elevated blood pressure reading BP Readings from Last 3 Encounters:  05/25/17 (!) 164/80  05/11/17 (!) 161/88  12/10/16 (!) 149/81  - asymptomatic - continue Triamterene. Likely needs triple therapy - follow-up with PCP    Patient education and anticipatory guidance given Patient agrees with treatment plan Follow-up as needed if symptoms worsen or fail to improve  Darlyne Russian PA-C

## 2017-05-25 NOTE — Patient Instructions (Signed)
Asthma, Acute Bronchospasm °Acute bronchospasm caused by asthma is also referred to as an asthma attack. Bronchospasm means your air passages become narrowed. The narrowing is caused by inflammation and tightening of the muscles in the air tubes (bronchi) in your lungs. This can make it hard to breathe or cause you to wheeze and cough. °What are the causes? °Possible triggers are: °· Animal dander from the skin, hair, or feathers of animals. °· Dust mites contained in house dust. °· Cockroaches. °· Pollen from trees or grass. °· Mold. °· Cigarette or tobacco smoke. °· Air pollutants such as dust, household cleaners, hair sprays, aerosol sprays, paint fumes, strong chemicals, or strong odors. °· Cold air or weather changes. Cold air may trigger inflammation. Winds increase molds and pollens in the air. °· Strong emotions such as crying or laughing hard. °· Stress. °· Certain medicines such as aspirin or beta-blockers. °· Sulfites in foods and drinks, such as dried fruits and wine. °· Infections or inflammatory conditions, such as a flu, cold, or inflammation of the nasal membranes (rhinitis). °· Gastroesophageal reflux disease (GERD). GERD is a condition where stomach acid backs up into your esophagus. °· Exercise or strenuous activity. ° °What are the signs or symptoms? °· Wheezing. °· Excessive coughing, particularly at night. °· Chest tightness. °· Shortness of breath. °How is this diagnosed? °Your health care provider will ask you about your medical history and perform a physical exam. A chest X-ray or blood testing may be performed to look for other causes of your symptoms or other conditions that may have triggered your asthma attack. °How is this treated? °Treatment is aimed at reducing inflammation and opening up the airways in your lungs. Most asthma attacks are treated with inhaled medicines. These include quick relief or rescue medicines (such as bronchodilators) and controller medicines (such as inhaled  corticosteroids). These medicines are sometimes given through an inhaler or a nebulizer. Systemic steroid medicine taken by mouth or given through an IV tube also can be used to reduce the inflammation when an attack is moderate or severe. Antibiotic medicines are only used if a bacterial infection is present. °Follow these instructions at home: °· Rest. °· Drink plenty of liquids. This helps the mucus to remain thin and be easily coughed up. Only use caffeine in moderation and do not use alcohol until you have recovered from your illness. °· Do not smoke. Avoid being exposed to secondhand smoke. °· You play a critical role in keeping yourself in good health. Avoid exposure to things that cause you to wheeze or to have breathing problems. °· Keep your medicines up-to-date and available. Carefully follow your health care provider’s treatment plan. °· Take your medicine exactly as prescribed. °· When pollen or pollution is bad, keep windows closed and use an air conditioner or go to places with air conditioning. °· Asthma requires careful medical care. See your health care provider for a follow-up as advised. If you are more than [redacted] weeks pregnant and you were prescribed any new medicines, let your obstetrician know about the visit and how you are doing. Follow up with your health care provider as directed. °· After you have recovered from your asthma attack, make an appointment with your outpatient doctor to talk about ways to reduce the likelihood of future attacks. If you do not have a doctor who manages your asthma, make an appointment with a primary care doctor to discuss your asthma. °Get help right away if: °· You are getting worse. °·   You have trouble breathing. If severe, call your local emergency services (911 in the [place] °· You develop chest pain or discomfort. °· You are vomiting. °· You are not able to keep fluids down. °· You are coughing up yellow, green, brown, or bloody sputum. °· You have a fever  and your symptoms suddenly get worse. °· You have trouble swallowing. °This information is not intended to replace advice given to you by your health care provider. Make sure you discuss any questions you have with your health care provider. °Document Released: 08/20/2006 Document Revised: 10/17/2015 Document Reviewed: 11/10/2012 °Elsevier Interactive Patient Education © 2017 [employer] Inc[employer] ° °

## 2017-05-27 ENCOUNTER — Telehealth: Payer: Self-pay

## 2017-05-27 NOTE — Telephone Encounter (Signed)
Pt was seen on 05/25/17 and was Rx doxycycline.  She reports since taking the medication headache and diarrhea. Please advise. -EH/RMA

## 2017-05-28 MED ORDER — AZITHROMYCIN 250 MG PO TABS: ORAL_TABLET | ORAL | 0 refills | Status: DC

## 2017-05-28 NOTE — Telephone Encounter (Signed)
Re-assure patient that diarrhea and GI upset are normal adverse reactions to antibiotics and this will resolve on its own within a couple days of stopping the antibiotic. It is okay to use Imodium as needed for diarrhea Recommend eating yogurt or starting daily probiotic for the next week or so New prescription sent for Azithromycin x 5 days

## 2017-05-28 NOTE — Telephone Encounter (Signed)
Pt advised, no further concerns.

## 2017-05-28 NOTE — Telephone Encounter (Signed)
Pt called for status update on changing Rx. Symptoms are the same, and she stopped taking it. She is still taking the prednisone. Routing for review.

## 2017-06-01 ENCOUNTER — Telehealth: Payer: Self-pay | Admitting: Family Medicine

## 2017-06-01 ENCOUNTER — Encounter: Payer: Self-pay | Admitting: Physician Assistant

## 2017-06-01 DIAGNOSIS — D500 Iron deficiency anemia secondary to blood loss (chronic): Secondary | ICD-10-CM

## 2017-06-01 DIAGNOSIS — E785 Hyperlipidemia, unspecified: Secondary | ICD-10-CM

## 2017-06-01 DIAGNOSIS — N951 Menopausal and female climacteric states: Secondary | ICD-10-CM

## 2017-06-01 DIAGNOSIS — Z1329 Encounter for screening for other suspected endocrine disorder: Secondary | ICD-10-CM

## 2017-06-01 DIAGNOSIS — M85861 Other specified disorders of bone density and structure, right lower leg: Secondary | ICD-10-CM

## 2017-06-01 DIAGNOSIS — R030 Elevated blood-pressure reading, without diagnosis of hypertension: Secondary | ICD-10-CM

## 2017-06-01 DIAGNOSIS — I10 Essential (primary) hypertension: Secondary | ICD-10-CM | POA: Insufficient documentation

## 2017-06-01 DIAGNOSIS — M85862 Other specified disorders of bone density and structure, left lower leg: Secondary | ICD-10-CM

## 2017-06-01 DIAGNOSIS — Z780 Asymptomatic menopausal state: Secondary | ICD-10-CM

## 2017-06-01 NOTE — Addendum Note (Signed)
Addended by: Teddy Spike on: 06/01/2017 05:02 PM   Modules accepted: Orders

## 2017-06-01 NOTE — Telephone Encounter (Signed)
Labs ordered.

## 2017-06-01 NOTE — Telephone Encounter (Signed)
OK for CMP, lipid, TSH, Ferritin, Iron, CBC.  , vit D.  Use iron def anemia. Screen lipoid

## 2017-06-01 NOTE — Telephone Encounter (Signed)
Pt has scheduled a medicare wellness exam on 1/29 and would like to come in the week prior to have labs drawn.

## 2017-06-01 NOTE — Telephone Encounter (Signed)
Pt advised and transferred to scheduling.

## 2017-06-01 NOTE — Telephone Encounter (Signed)
-----   Message from [facility] [place] sent at 06/01/2017  3:24 PM EST ----- Please schedule patient for blood pressure follow-up with Dr. Madilyn Fireman

## 2017-06-01 NOTE — Telephone Encounter (Signed)
Pt informed that labs have been faxed.[name] [place]

## 2017-06-10 DIAGNOSIS — D500 Iron deficiency anemia secondary to blood loss (chronic): Secondary | ICD-10-CM | POA: Diagnosis not present

## 2017-06-10 DIAGNOSIS — M85861 Other specified disorders of bone density and structure, right lower leg: Secondary | ICD-10-CM | POA: Diagnosis not present

## 2017-06-10 DIAGNOSIS — R030 Elevated blood-pressure reading, without diagnosis of hypertension: Secondary | ICD-10-CM | POA: Diagnosis not present

## 2017-06-10 DIAGNOSIS — E785 Hyperlipidemia, unspecified: Secondary | ICD-10-CM | POA: Diagnosis not present

## 2017-06-10 DIAGNOSIS — M85862 Other specified disorders of bone density and structure, left lower leg: Secondary | ICD-10-CM | POA: Diagnosis not present

## 2017-06-10 DIAGNOSIS — Z1329 Encounter for screening for other suspected endocrine disorder: Secondary | ICD-10-CM | POA: Diagnosis not present

## 2017-06-11 LAB — COMPLETE METABOLIC PANEL WITH GFR
AG RATIO: 1.8 (calc) (ref 1.0–2.5)
ALBUMIN MSPROF: 3.8 g/dL (ref 3.6–5.1)
ALKALINE PHOSPHATASE (APISO): 90 U/L (ref 33–130)
ALT: 19 U/L (ref 6–29)
AST: 19 U/L (ref 10–35)
BUN: 9 mg/dL (ref 7–25)
CO2: 30 mmol/L (ref 20–32)
CREATININE: 0.71 mg/dL (ref 0.50–0.99)
Calcium: 8.7 mg/dL (ref 8.6–10.4)
Chloride: 98 mmol/L (ref 98–110)
GFR, EST NON AFRICAN AMERICAN: 89 mL/min/{1.73_m2} (ref >=60)
GFR, Est African American: 103 mL/min/{1.73_m2} (ref >=60)
GLOBULIN: 2.1 g/dL (ref 1.9–3.7)
Glucose, Bld: 92 mg/dL (ref 65–99)
POTASSIUM: 4.4 mmol/L (ref 3.5–5.3)
SODIUM: 134 mmol/L — AB (ref 135–146)
Total Bilirubin: 0.4 mg/dL (ref 0.2–1.2)
Total Protein: 5.9 g/dL — ABNORMAL LOW (ref 6.1–8.1)

## 2017-06-11 LAB — CBC
HEMATOCRIT: 35.1 % (ref 35.0–45.0)
HEMOGLOBIN: 12 g/dL (ref 11.7–15.5)
MCH: 31.3 pg (ref 27.0–33.0)
MCHC: 34.2 g/dL (ref 32.0–36.0)
MCV: 91.6 fL (ref 80.0–100.0)
MPV: 8.9 fL (ref 7.5–12.5)
Platelets: 342 10*3/uL (ref 140–400)
RBC: 3.83 10*6/uL (ref 3.80–5.10)
RDW: 12.3 % (ref 11.0–15.0)
WBC: 5.5 10*3/uL (ref 3.8–10.8)

## 2017-06-11 LAB — LIPID PANEL W/REFLEX DIRECT LDL
CHOLESTEROL: 259 mg/dL — AB (ref <200)
HDL: 108 mg/dL (ref >50)
LDL Cholesterol (Calc): 132 mg/dL (calc) — ABNORMAL HIGH
Non-HDL Cholesterol (Calc): 151 mg/dL (calc) — ABNORMAL HIGH (ref <130)
Total CHOL/HDL Ratio: 2.4 (calc) (ref <5.0)
Triglycerides: 87 mg/dL (ref <150)

## 2017-06-11 LAB — TSH: TSH: 2.37 m[IU]/L (ref 0.40–4.50)

## 2017-06-11 LAB — VITAMIN D 25 HYDROXY (VIT D DEFICIENCY, FRACTURES): Vit D, 25-Hydroxy: 26 ng/mL — ABNORMAL LOW (ref 30–100)

## 2017-06-11 LAB — IRON: IRON: 60 ug/dL (ref 45–160)

## 2017-06-11 LAB — FERRITIN: Ferritin: 9 ng/mL — ABNORMAL LOW (ref 20–288)

## 2017-06-12 ENCOUNTER — Telehealth: Payer: Self-pay

## 2017-06-12 NOTE — Telephone Encounter (Signed)
Yes, increase to 4000 IU daily.  Thank you.

## 2017-06-12 NOTE — Telephone Encounter (Signed)
[name] called and states she is already taking 2000 IU's of vitamin D daily. Should she increase the dose due to her recent labs?

## 2017-06-15 NOTE — Telephone Encounter (Signed)
Pt advised,verbalized understanding. 

## 2017-06-16 ENCOUNTER — Ambulatory Visit (INDEPENDENT_AMBULATORY_CARE_PROVIDER_SITE_OTHER): Payer: Medicare Other | Admitting: Family Medicine

## 2017-06-16 ENCOUNTER — Encounter: Payer: Self-pay | Admitting: Family Medicine

## 2017-06-16 VITALS — BP 152/77 | HR 67 | Ht 62.0 in | Wt 148.0 lb

## 2017-06-16 DIAGNOSIS — Z0000 Encounter for general adult medical examination without abnormal findings: Secondary | ICD-10-CM | POA: Diagnosis not present

## 2017-06-16 DIAGNOSIS — Z23 Encounter for immunization: Secondary | ICD-10-CM | POA: Diagnosis not present

## 2017-06-16 MED ORDER — AMBULATORY NON FORMULARY MEDICATION: 0 refills | Status: DC

## 2017-06-16 NOTE — Progress Notes (Signed)
Subjective:   [name] is a 67 y.o. female who presents for Medicare Annual (Subsequent) preventive examination.  Review of Systems:  Comprehensive ROs is negative.        Objective:     Vitals: BP (!) 152/77   Pulse 67   Ht 5\' 2"  (1.575 m)   Wt 148 lb (67.1 kg)   SpO2 99%   BMI 27.07 kg/m   Body mass index is 27.07 kg/m.  Advanced Directives 06/16/2017 04/04/2016  Does Patient Have a Medical Advance Directive? Yes Yes  Type of Paramedic of [place];Living will [place];Living will  Does patient want to make changes to medical advance directive? No - Patient declined No - Patient declined  Copy of [place] in Chart? No - copy requested -    Tobacco Social History   Tobacco Use  Smoking Status Never Smoker  Smokeless Tobacco Never Used     Counseling given: Not Answered   Clinical Intake:       Physical Exam  Constitutional: She is oriented to person, place, and time. She appears well-developed and well-nourished.  HENT:  Head: Normocephalic and atraumatic.  Cardiovascular: Normal rate, regular rhythm and normal heart sounds.  Pulmonary/Chest: Effort normal and breath sounds normal.  Neurological: She is alert and oriented to person, place, and time.  Skin: Skin is warm and dry.  Psychiatric: She has a normal mood and affect. Her behavior is normal.                   Past Medical History:  Diagnosis Date  . Asthma   . Breast cancer ([place])   . HSV (herpes simplex virus) anogenital infection   . Hyperlipidemia   . Hypertension   . Meniere's disease   . Migraine with visual aura    optical  . Postmenopausal   . Volvulus ([place])    Past Surgical History:  Procedure Laterality Date  . COLON SURGERY  2003   resection/small bowel ischemia  . ESOPHAGEAL DILATION    . HERNIA REPAIR    . LTCS    . OVARIAN CYST REMOVAL    . SMALL INTESTINE SURGERY  02/2010  .  TYMPANOSTOMY TUBE PLACEMENT     placement RT, out now.    Family History  Problem Relation Age of Onset  . Hypertension Mother   . Hypertension Father   . Depression Father   . Glaucoma Father   . Parkinsonism Father   . Depression Sister   . Depression Sister    Social History   Socioeconomic History  . Marital status: Married    Spouse name: None  . Number of children: None  . Years of education: None  . Highest education level: None  Social Needs  . Financial resource strain: None  . Food insecurity - worry: None  . Food insecurity - inability: None  . Transportation needs - medical: None  . Transportation needs - non-medical: None  Occupational History  . Occupation: Environmental health practitioner    Comment: [employer]  Tobacco Use  . Smoking status: Never Smoker  . Smokeless tobacco: Never Used  Substance and Sexual Activity  . Alcohol use: Yes    Alcohol/week: 1.8 oz    Types: 3 Glasses of wine per week    Comment: per week  . Drug use: No  . Sexual activity: Yes    Partners: Male  Other Topics Concern  . None  Social History  Narrative   Working out twice per week.      Outpatient Encounter Medications as of 06/16/2017  Medication Sig  . Cholecalciferol 1000 units CHEW Chew by mouth.  . citalopram (CELEXA) 20 MG tablet TAKE 1 TABLET (20 MG TOTAL) BY MOUTH DAILY.  . SUMAtriptan (IMITREX) 100 MG tablet Take 1 tablet (100 mg total) by mouth every 2 (two) hours as needed.  . triamterene-hydrochlorothiazide (MAXZIDE) 75-50 MG tablet Take 1 tablet by mouth daily.  . valACYclovir (VALTREX) 1000 MG tablet TAKE 1 TABLET (1,000 MG TOTAL) BY MOUTH 2 (TWO) TIMES DAILY. (1/2) TAB TWICE DAILY.  . VENTOLIN HFA 108 (90 Base) MCG/ACT inhaler INHALE 2 PUFFS INTO THE LUNGS EVERY 6 (SIX) HOURS AS NEEDED.  [facility] AMBULATORY NON FORMULARY MEDICATION Medication Name: Tdap x 1IM  . AMBULATORY NON FORMULARY MEDICATION Medication Name: Shingrix IM x 1. Then repeat in 2-6 months.  . montelukast  (SINGULAIR) 10 MG tablet TAKE 1 TABLET (10 MG TOTAL) BY MOUTH AT BEDTIME. (Patient not taking: Reported on 06/16/2017)  . [DISCONTINUED] azithromycin (ZITHROMAX Z-PAK) 250 MG tablet Take 2 tablets (500 mg) on  Day 1,  followed by 1 tablet (250 mg) once daily on Days 2 through 5.  . [DISCONTINUED] denosumab (PROLIA) 60 MG/ML SOLN injection Inject 60 mg into the skin every 6 (six) months. Administer in upper arm, thigh, or abdomen  . [DISCONTINUED] tamoxifen (NOLVADEX) 20 MG tablet Take by mouth.   No facility-administered encounter medications on file as of 06/16/2017.     Activities of Daily Living In your present state of health, do you have any difficulty performing the following activities: 06/16/2017  Hearing? Y  Vision? N  Difficulty concentrating or making decisions? N  Walking or climbing stairs? N  Dressing or bathing? N  Doing errands, shopping? N  Some recent data might be hidden    Patient Care Team: Hali Marry, MD as PCP - General (Family Medicine)    Assessment:   This is a routine wellness examination for [name].  Exercise Activities and Dietary recommendations Current Exercise Habits: Home exercise routine, Type of exercise: walking(1.5 miles per day also nautilus machine), Frequency (Times/Week): 6, Exercise limited by: None identified  Goals    None      Fall Risk Fall Risk  06/16/2017 05/25/2017 02/26/2016  Falls in the past year? No No Yes  Number falls in past yr: - - 1  Injury with Fall? - - No  Risk for fall due to : - - Other (Comment)    Depression Screen PHQ 2/9 Scores 06/16/2017 08/26/2016 02/26/2016  PHQ - 2 Score 0 1 0  PHQ- 9 Score - 2 -     Cognitive Function     6CIT Screen 06/16/2017 02/26/2016  What Year? 0 points 0 points  What month? 0 points 0 points  What time? 0 points 0 points  Count back from 20 0 points 0 points  Months in reverse 0 points 0 points  Repeat phrase 0 points 0 points  Total Score 0 0     Immunization History  Administered Date(s) Administered  . Influenza Split 06/08/2012  . Influenza Whole 05/20/2004, 04/09/2007, 02/17/2008, 02/19/2009, 03/06/2010  . Influenza,inj,Quad PF,6+ Mos 02/03/2013, 01/17/2014, 02/08/2015, 02/26/2016  . Pneumococcal Conjugate-13 06/16/2017  . Pneumococcal Polysaccharide-23 08/29/2009  . Td 10/20/2006    Qualifies for Shingles Vaccine? Yes, discussed.   Screening Tests Health Maintenance  Topic Date Due  . Hepatitis C Screening  08/09/1950  . TETANUS/TDAP  10/19/2016  .  INFLUENZA VACCINE  02/01/2018 (Originally 12/17/2016)  . MAMMOGRAM  04/03/2018  . PNA vac Low Risk Adult (2 of 2 - PPSV23) 06/16/2018  . COLONOSCOPY  12/26/2021  . DEXA SCAN  Completed    Cancer Screenings: Lung: Low Dose CT Chest recommended if Age 66-80 years, 30 pack-year currently smoking OR have quit w/in 15years. Patient does not qualify. Breast:  Up to date on Mammogram? Yes   Up to date of Bone Density/Dexa? Yes Colorectal: Uptodate.        Plan:     I have personally reviewed and noted the following in the patient's chart:   . Medical and social history- updated    . Use of alcohol, tobacco or illicit drugs  . Current medications and supplements . Functional ability and status - good . Nutritional status - good . Physical activity - she is active.   . Advanced directives . List of other physicians - updated   . Hospitalizations, surgeries, and ER visits in previous 12 months . Vitals . Screenings to include cognitive, depression, and falls - negative.   . Referrals and appointments . Prevnar 13 given Discussed need for Shingrix and Tdap which is due.Given rx to take o the pharamcy   In addition, I have reviewed and discussed with patient certain preventive protocols, quality metrics, and best practice recommendations. A written personalized care plan for preventive services as well as general preventive health recommendations were provided to  patient.     Beatrice Lecher, MD  06/16/2017

## 2017-06-17 ENCOUNTER — Telehealth: Payer: Self-pay | Admitting: Family Medicine

## 2017-06-17 NOTE — Telephone Encounter (Signed)
Please import mammogram report from [facility].

## 2017-06-18 NOTE — Telephone Encounter (Signed)
Please see request for mammo

## 2017-06-19 ENCOUNTER — Other Ambulatory Visit: Payer: Self-pay | Admitting: Family Medicine

## 2017-06-22 NOTE — Telephone Encounter (Signed)
Done

## 2017-06-24 ENCOUNTER — Other Ambulatory Visit: Payer: Self-pay | Admitting: Family Medicine

## 2017-06-29 ENCOUNTER — Encounter: Payer: Self-pay | Admitting: Family Medicine

## 2017-07-01 ENCOUNTER — Encounter: Payer: Self-pay | Admitting: Family Medicine

## 2017-07-24 ENCOUNTER — Ambulatory Visit (INDEPENDENT_AMBULATORY_CARE_PROVIDER_SITE_OTHER): Payer: Medicare Other

## 2017-07-24 ENCOUNTER — Ambulatory Visit (INDEPENDENT_AMBULATORY_CARE_PROVIDER_SITE_OTHER): Payer: Medicare Other | Admitting: Sports Medicine

## 2017-07-24 DIAGNOSIS — M7551 Bursitis of right shoulder: Secondary | ICD-10-CM | POA: Insufficient documentation

## 2017-07-24 DIAGNOSIS — M25511 Pain in right shoulder: Secondary | ICD-10-CM

## 2017-07-24 DIAGNOSIS — M7061 Trochanteric bursitis, right hip: Secondary | ICD-10-CM | POA: Insufficient documentation

## 2017-07-24 DIAGNOSIS — M25551 Pain in right hip: Secondary | ICD-10-CM | POA: Diagnosis not present

## 2017-07-24 MED ORDER — MELOXICAM 15 MG PO TABS: ORAL_TABLET | ORAL | 3 refills | Status: DC

## 2017-07-24 NOTE — Progress Notes (Signed)
Subjective:    I'm seeing this patient as a consultation for: Dr. Beatrice Lecher  CC: Right shoulder pain, right hip pain  HPI: Right shoulder pain: Present for the past 5 months, severe, persistent, localized over the deltoid and worse with abduction and overhead activities.  Right hip pain: Localized laterally, worse with laying on the ipsilateral side, moderate, persistent without radiation.  I reviewed the past medical history, family history, social history, surgical history, and allergies today and no changes were needed.  Please see the problem list section below in epic for further details.  Past Medical History: Past Medical History:  Diagnosis Date  . Asthma   . Breast cancer ([place])   . HSV (herpes simplex virus) anogenital infection   . Hyperlipidemia   . Hypertension   . Meniere's disease   . Migraine with visual aura    optical  . Postmenopausal   . Volvulus ([place])    Past Surgical History: Past Surgical History:  Procedure Laterality Date  . COLON SURGERY  2003   resection/small bowel ischemia  . ESOPHAGEAL DILATION    . HERNIA REPAIR    . LTCS    . OVARIAN CYST REMOVAL    . SMALL INTESTINE SURGERY  02/2010  . TYMPANOSTOMY TUBE PLACEMENT     placement RT, out now.    Social History: Social History   Socioeconomic History  . Marital status: Married    Spouse name: Not on file  . Number of children: Not on file  . Years of education: Not on file  . Highest education level: Not on file  Social Needs  . Financial resource strain: Not on file  . Food insecurity - worry: Not on file  . Food insecurity - inability: Not on file  . Transportation needs - medical: Not on file  . Transportation needs - non-medical: Not on file  Occupational History  . Occupation: Environmental health practitioner    Comment: [employer]  Tobacco Use  . Smoking status: Never Smoker  . Smokeless tobacco: Never Used  Substance and Sexual Activity  . Alcohol use: Yes    Alcohol/week: 1.8  oz    Types: 3 Glasses of wine per week    Comment: per week  . Drug use: No  . Sexual activity: Yes    Partners: Male  Other Topics Concern  . Not on file  Social History Narrative   Working out twice per week.     Family History: Family History  Problem Relation Age of Onset  . Hypertension Mother   . Hypertension Father   . Depression Father   . Glaucoma Father   . Parkinsonism Father   . Depression Sister   . Depression Sister    Allergies: Allergies  Allergen Reactions  . Codeine     REACTION: hyper   Medications: See med rec.  Review of Systems: No headache, visual changes, nausea, vomiting, diarrhea, constipation, dizziness, abdominal pain, skin rash, fevers, chills, night sweats, weight loss, swollen lymph nodes, body aches, joint swelling, muscle aches, chest pain, shortness of breath, mood changes, visual or auditory hallucinations.   Objective:   General: Well Developed, well nourished, and in no acute distress.  Neuro:  Extra-ocular muscles intact, able to move all 4 extremities, sensation grossly intact.  Deep tendon reflexes tested were normal. Psych: Alert and oriented, mood congruent with affect. ENT:  Ears and nose appear unremarkable.  Hearing grossly normal. Neck: Unremarkable overall appearance, trachea midline.  No visible thyroid enlargement. Eyes:  Conjunctivae and lids appear unremarkable.  Pupils equal and round. Skin: Warm and dry, no rashes noted.  Cardiovascular: Pulses palpable, no extremity edema. Right shoulder: Inspection reveals no abnormalities, atrophy or asymmetry. Palpation is normal with no tenderness over AC joint or bicipital groove. ROM is full in all planes. Rotator cuff strength normal throughout. Positive Neer and Hawkin's tests, empty can. Speeds and Yergason's tests normal. No labral pathology noted with negative Obrien's, negative crank, negative clunk, and good stability. Normal scapular function observed. No painful arc  and no drop arm sign. No apprehension sign Right hip: ROM IR: 60 Deg, ER: 60 Deg, Flexion: 120 Deg, Extension: 100 Deg, Abduction: 45 Deg, Adduction: 45 Deg Strength IR: 5/5, ER: 5/5, Flexion: 5/5, Extension: 5/5, Abduction: 5/5, Adduction: 5/5 Pelvic alignment unremarkable to inspection and palpation. Standing hip rotation and gait without trendelenburg / unsteadiness. Greater trochanter with tenderness to palpation, hip abductors are also weak on the side. No tenderness over piriformis. No SI joint tenderness and normal minimal SI movement.  Impression and Recommendations:   This case required medical decision making of moderate complexity.  Trochanteric bursitis, right hip X-rays, meloxicam, aggressive physical therapy.  Subacromial bursitis of right shoulder joint Discussed pathophysiology, anatomy, meloxicam, x-rays, aggressive formal physical therapy, return in 4-6 weeks, injection if no better. ___________________________________________ Gwen Her. Dianah Field, M.D., ABFM., CAQSM. Primary Care and [place] Instructor of [place] of [facility] of Medicine

## 2017-07-24 NOTE — Assessment & Plan Note (Signed)
X-rays, meloxicam, aggressive physical therapy.

## 2017-07-24 NOTE — Assessment & Plan Note (Signed)
Discussed pathophysiology, anatomy, meloxicam, x-rays, aggressive formal physical therapy, return in 4-6 weeks, injection if no better.

## 2017-08-05 ENCOUNTER — Ambulatory Visit (INDEPENDENT_AMBULATORY_CARE_PROVIDER_SITE_OTHER): Payer: Medicare Other | Admitting: Rehabilitative and Restorative Service Providers"

## 2017-08-05 ENCOUNTER — Encounter: Payer: Self-pay | Admitting: Rehabilitative and Restorative Service Providers"

## 2017-08-05 DIAGNOSIS — M25511 Pain in right shoulder: Secondary | ICD-10-CM | POA: Diagnosis not present

## 2017-08-05 DIAGNOSIS — R293 Abnormal posture: Secondary | ICD-10-CM | POA: Diagnosis not present

## 2017-08-05 DIAGNOSIS — R29898 Other symptoms and signs involving the musculoskeletal system: Secondary | ICD-10-CM

## 2017-08-05 NOTE — Patient Instructions (Signed)
Axial Extension (Chin Tuck)    Pull chin in and lengthen back of neck. Hold __5__ seconds while counting out loud. Repeat __10__ times. Do __several__ sessions per day.  Shoulder Blade Squeeze    Rotate shoulders back, then squeeze shoulder blades down and back . Hold 10 sec Repeat _10_ times. Do _several ___ sessions per day.  Upper Back Strength: Lower Trapezius / Rotator Cuff " L's "     Arms in waitress pose, palms up. Press hands back and slide shoulder blades down. Hold for __5__ seconds. Repeat _10___ times. 1-2 times per day.    Scapular Retraction: Elbow Flexion (Standing)  "W's"     With elbows bent to 90, pinch shoulder blades together and rotate arms out, keeping elbows bent. Repeat __10__ times per set. Do __1-2__ sets per session. Do _several ___ sessions per day.  Scapula Adduction With Pectoralis Stretch: Low - Standing   Shoulders at 45 hands even with shoulders, keeping weight through legs, shift weight forward until you feel pull or stretch through the front of your chest. Hold _30__ seconds. Do _3__ times, _2-4__ times per day.   Scapula Adduction With Pectoralis Stretch: Mid-Range - Standing   Shoulders at 90 elbows even with shoulders, keeping weight through legs, shift weight forward until you feel pull or strength through the front of your chest. Hold __30_ seconds. Do _3__ times, __2-4_ times per day.   Scapula Adduction With Pectoralis Stretch: High - Standing   Shoulders at 120 hands up high on the doorway, keeping weight on feet, shift weight forward until you feel pull or stretch through the front of your chest. Hold _30__ seconds. Do _3__ times, _2-3__ times per day.  ELBOW: Biceps - Standing    Standing in doorway, place one hand on wall, elbow straight. Lean forward. Hold _30__ seconds. _3__ reps per set, __2-3_ sets per day    [facility] [place], [place] [address]  [phone] (office) [phone] (fax)

## 2017-08-05 NOTE — Therapy (Addendum)
[place] [place], [place], [address] Phone: [phone]   Fax:  [phone]  Physical Therapy Evaluation  Patient Details  Name: [name] MRN: [id number] Date of Birth: 1951-04-13 Referring Provider: Dr Dianah Field    Encounter Date: 08/05/2017  PT End of Session - 08/05/17 1614    Visit Number  1    Number of Visits  12    Date for PT Re-Evaluation  09/16/17    PT Start Time  0254    PT Stop Time  1622    PT Time Calculation (min)  65 min    Activity Tolerance  Patient tolerated treatment well       Past Medical History:  Diagnosis Date  . Asthma   . Breast cancer ([place])   . HSV (herpes simplex virus) anogenital infection   . Hyperlipidemia   . Hypertension   . Meniere's disease   . Migraine with visual aura    optical  . Postmenopausal   . Volvulus ([place])     Past Surgical History:  Procedure Laterality Date  . COLON SURGERY  2003   resection/small bowel ischemia  . ESOPHAGEAL DILATION    . HERNIA REPAIR    . LTCS    . OVARIAN CYST REMOVAL    . SMALL INTESTINE SURGERY  02/2010  . TYMPANOSTOMY TUBE PLACEMENT     placement RT, out now.     There were no vitals filed for this visit.   Subjective Assessment - 08/05/17 1522    Subjective  Patient reports that she was swinging arms while walking the first part of February. She has felt a "toothache" type pain since that time with symptoms gradually getting a little worse. She has had some Rt hip pain but that is not bothering her today. It's just intermittent and usually related to prolonged standing     Pertinent History  Meiniers disease; lumpectomy for breast cancer Lt 2017     Diagnostic tests  xrays     Patient Stated Goals  get rid of the shoulder pain and get arm back - palce purse in the seat beside of her in the car     Currently in Pain?  Yes    Pain Score  5     Pain Location  Shoulder    Pain Orientation  Right     Pain Descriptors / Indicators  Aching    Pain Type  Acute pain    Pain Radiating Towards  in the muscle of the Rt arm (biceps)     Pain Onset  More than a month ago    Pain Frequency  Intermittent    Aggravating Factors   using the Rt arm for lifting; reaching; reaching across the body; reaching out to the side    Pain Relieving Factors  meds but that creates nausea          [facility] PT Assessment - 08/05/17 0001      Assessment   Medical Diagnosis  Rt shoulder dysfunction; Rt trochanteric bursitis     Referring Provider  Dr Dianah Field     Onset Date/Surgical Date  06/19/17    Hand Dominance  Right    Next MD Visit  09/13/17    Prior Therapy  none for this problem       Precautions   Precaution Comments  Cancer - Lt breast 2017 - NO MODALITIES unless cleared by oncologist       Balance  Screen   Has the patient fallen in the past 6 months  No    Has the patient had a decrease in activity level because of a fear of falling?   No    Is the patient reluctant to leave their home because of a fear of falling?   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     Leisure  household chores; three times/wk in the gym - cardio; wt machines - lower body only was doing upper body prior to onset of shoulder pain       Observation/Other Assessments   Focus on Therapeutic Outcomes (FOTO)   42% limitation       Sensation   Additional Comments  WFL's per pt report       Posture/Postural Control   Posture Comments  head forward; shoulders rounded and elevated; head of the humerus anterior in orientation; incresaed thoracic kyphosis; scapulae abducted and rotated along the thoracic wall; head of the humerus anterior in orientation       AROM   Right Shoulder Extension  42 Degrees    Right Shoulder Flexion  154 Degrees    Right Shoulder ABduction  152 Degrees    Right Shoulder  Internal Rotation  20 Degrees    Right Shoulder External Rotation  94 Degrees    Left Shoulder Extension  45 Degrees    Left Shoulder Flexion  152 Degrees    Left Shoulder ABduction  154 Degrees    Left Shoulder Internal Rotation  40 Degrees    Left Shoulder External Rotation  90 Degrees    Cervical Flexion  70    Cervical Extension  53    Cervical - Right Side Bend  35    Cervical - Left Side Bend  28    Cervical - Right Rotation  85    Cervical - Left Rotation  85      Strength   Overall Strength Comments  5/5 bilat UE's pain with resistive testing Rt shd flex/abd/ER      Palpation   Spinal mobility  hypomobile thoracic and cervical spine     Palpation comment  muscular tightness through pecs; upper trap; leveaator; teres; biceps; deltoid              Objective measurements completed on examination: See above findings.      [facility] Adult PT Treatment/Exercise - 08/05/17 0001      Neuro Re-ed    Neuro Re-ed Details   postural correction working on posterior shoulder girdle engagement      Shoulder Exercises: Standing   Other Standing Exercises  axial extensioin 10 sec x 5; scap squeeze 10 sec x 10; L's x10; W's x 10 with swim noodle       Shoulder Exercises: Stretch   Other Shoulder Stretches  3 way doorway stretch 30 sec x 2 reps each position     Other Shoulder Stretches  biceps stretch 30 sec x 2       Modalities   Modalities  -- no modalities d/t hx of breast cancer       Moist Heat Therapy   Number Minutes Moist Heat  15 Minutes    Moist Heat Location  Shoulder Rt             PT Education -  08/05/17 1557    Education provided  Yes    Education Details  postural education; HEP     Person(s) Educated  Patient    Methods  Explanation;Demonstration;Tactile cues;Verbal cues;Handout    Comprehension  Verbalized understanding;Returned demonstration;Verbal cues required;Tactile cues required          PT Long Term Goals - 08/05/17 1620      PT LONG  TERM GOAL #1   Title  Improve posture and alignment with patient to demonstrated improved upright posture with posterior shoulder girdle engaged 09/16/17    Time  6    Period  Weeks    Status  New      PT LONG TERM GOAL #2   Title  Increase lateral cervical flexion by 5-10 degees to improve upper body function 09/16/17    Time  6    Period  Weeks    Status  New      PT LONG TERM GOAL #3   Title  Decrease pain Rt shoulder alloiwing patient to use Rt UE for normal functional activities including reaching to the side to place purse on seat beside her in her car 09/16/17    Time  6    Period  Weeks    Status  New      PT LONG TERM GOAL #4   Title  Independent in HEP 09/16/17    Time  6    Period  Weeks    Status  New      PT LONG TERM GOAL #5   Title  Improve FOTO to </= 30% limitation 09/16/17     Time  6    Period  Weeks    Status  New             Plan - 08/05/17 1615    Clinical Impression Statement  Tye [place] presents with ~ 6-7 week history of Rt shoulder pain which has progressively worsened since the onset of symptoms the first of February. She has poor posture and alignment; mild limitations in ROM of cervical spine and shoulders; pain with active movement Rt shoulder and with resistive strength testing Rt UE. She has muscular tightness to palpation Rt > Lt pecs; upper trap; leveator; teres; biceps; deltoid. Patient will benefit from PT to address problems identified.     History and Personal Factors relevant to plan of care:  History of Lt breast cancer 2017 - hold modalities unless approved by oncologist     Clinical Presentation  Stable    Clinical Decision Making  Low    Rehab Potential  Good    PT Frequency  2x / week    PT Duration  6 weeks    PT Treatment/Interventions  Patient/family education;ADLs/Self Care Home Management;Cryotherapy;Electrical Stimulation;Iontophoresis 4mg /ml Dexamethasone;Moist Heat;Ultrasound;Neuromuscular re-education;Dry needling;Manual  techniques;Therapeutic activities;Therapeutic exercise    PT Next Visit Plan  review HEP; add manual work Rt upper quarter; add snow angel; progress exercises as indicated; moist heat - no modalities     Recommended Other Services  Evalauation and assessment of Rt hip pain as indicated     Consulted and Agree with Plan of Care  Patient       Patient will benefit from skilled therapeutic intervention in order to improve the following deficits and impairments:  Postural dysfunction, Improper body mechanics, Pain, Increased fascial restricitons, Increased muscle spasms, Hypomobility, Decreased mobility, Decreased range of motion, Decreased activity tolerance  Visit Diagnosis: Acute pain of right shoulder - Plan: PT plan of care  cert/re-cert  Other symptoms and signs involving the musculoskeletal system - Plan: PT plan of care cert/re-cert  Abnormal posture - Plan: PT plan of care cert/re-cert     Problem List Patient Active Problem List   Diagnosis Date Noted  . Subacromial bursitis of right shoulder joint 07/24/2017  . Trochanteric bursitis, right hip 07/24/2017  . Elevated blood pressure reading 06/01/2017  . History of adenomatous polyp of colon 01/02/2017  . Lymphopenia 07/03/2016  . Osteopenia of both lower legs 05/23/2016  . Iron deficiency anemia due to chronic blood loss 04/16/2016  . Malignant neoplasm of central portion of left breast in female, estrogen receptor positive ([place]) 04/09/2016  . First degree ankle sprain, right, initial encounter 03/25/2016  . Meniere disease 08/04/2014  . Left lumbar radiculitis 04/11/2013  . Gastric ulcer 02/10/2011  . Hiatal hernia 02/07/2011  . OSTEOPENIA 12/02/2006  . HYPERLIPIDEMIA 10/20/2006  . Hyperlipidemia 10/20/2006  . HSV 06/16/2006  . Major depressive disorder, recurrent episode ([place]) 02/24/2006  . MIGRAINE, UNSPEC., W/O INTRACTABLE MIGRAINE 02/24/2006  . Asthma, moderate persistent 02/24/2006  . MENOPAUSAL SYNDROME  02/24/2006  . Menopausal syndrome 02/24/2006    Celyn Nilda Simmer PT, MPH  08/05/2017, 4:28 PM  [facility] [place], [place], [address] Phone: [phone]   Fax:  [phone]  Name: LEVETTE PAULICK MRN: [id number] Date of Birth: [birth year]

## 2017-08-13 ENCOUNTER — Ambulatory Visit (INDEPENDENT_AMBULATORY_CARE_PROVIDER_SITE_OTHER): Payer: Medicare Other | Admitting: Rehabilitative and Restorative Service Providers"

## 2017-08-13 ENCOUNTER — Encounter: Payer: Self-pay | Admitting: Rehabilitative and Restorative Service Providers"

## 2017-08-13 DIAGNOSIS — R29898 Other symptoms and signs involving the musculoskeletal system: Secondary | ICD-10-CM

## 2017-08-13 DIAGNOSIS — M6281 Muscle weakness (generalized): Secondary | ICD-10-CM | POA: Diagnosis not present

## 2017-08-13 DIAGNOSIS — R293 Abnormal posture: Secondary | ICD-10-CM | POA: Diagnosis not present

## 2017-08-13 DIAGNOSIS — M25511 Pain in right shoulder: Secondary | ICD-10-CM | POA: Diagnosis not present

## 2017-08-13 NOTE — Patient Instructions (Addendum)
SUPINE Tips A    Being in the supine position means to be lying on the back. Lying on the back is the position of least compression on the bones and discs of the spine, and helps to re-align the natural curves of the back. Working toward 2-5 min for gentle stretch   Abdominal Bracing With Pelvic Floor (Hook-Lying)    With neutral spine, tighten pelvic floor and abdominals sucking belly button to back bone; tighten muscles in low back at waist; slowly exhale. Hold 10 sec  Repeat __10_ times. Do _several __ times a day. Progress to do this in sitting standing and with functional activities.

## 2017-08-13 NOTE — Therapy (Signed)
[place], [place], [address] Phone: [phone]   Fax:  [phone]  Physical Therapy Treatment  Patient Details  Name: [name] MRN: [id number] Date of Birth: October 29, 1950 Referring Provider: Dr Dianah Field    Encounter Date: 08/13/2017  PT End of Session - 08/13/17 1407    Visit Number  2    Number of Visits  12    Date for PT Re-Evaluation  09/16/17    PT Start Time  0938    PT Stop Time  1450    PT Time Calculation (min)  46 min    Activity Tolerance  Patient tolerated treatment well       Past Medical History:  Diagnosis Date  . Asthma   . Breast cancer ([place])   . HSV (herpes simplex virus) anogenital infection   . Hyperlipidemia   . Hypertension   . Meniere's disease   . Migraine with visual aura    optical  . Postmenopausal   . Volvulus ([place])     Past Surgical History:  Procedure Laterality Date  . COLON SURGERY  2003   resection/small bowel ischemia  . ESOPHAGEAL DILATION    . HERNIA REPAIR    . LTCS    . OVARIAN CYST REMOVAL    . SMALL INTESTINE SURGERY  02/2010  . TYMPANOSTOMY TUBE PLACEMENT     placement RT, out now.     There were no vitals filed for this visit.  Subjective Assessment - 08/13/17 1408    Subjective  No changes from the exercises - shoulder still hurts when she uses it.     Currently in Pain?  Yes    Pain Score  1     Pain Location  Shoulder    Pain Orientation  Right    Pain Descriptors / Indicators  Aching    Pain Type  Acute pain    Pain Onset  More than a month ago    Pain Frequency  Intermittent                No data recorded       [facility] Adult PT Treatment/Exercise - 08/13/17 0001      Neuro Re-ed    Neuro Re-ed Details   postural correction working on posterior shoulder girdle engagement      Shoulder Exercises: Standing   Other Standing Exercises  axial extensioin 10 sec x 5; scap squeeze 10 sec x 10;  L's x10; W's x 10 with swim noodle       Shoulder Exercises: Stretch   Other Shoulder Stretches  3 way doorway stretch 30 sec x 2 reps each position     Other Shoulder Stretches  biceps stretch 30 sec x 2       Modalities   Modalities  -- no modalities d/t hx of breast cancer       Manual Therapy   Manual therapy comments  pt supine     Joint Mobilization  GH joint mobs in multiple planes     Soft tissue mobilization  deep tissue work through the [employer]; upper trap; leveator; biceps; anterior deltoid; teres     Myofascial Release  anterior shoulder/pecs    Scapular Mobilization  Rt    Passive ROM  Rt shoulder              PT Education - 08/13/17 1438    Education provided  Yes    Education Details  HEP     Person(s) Educated  Patient    Methods  Explanation;Demonstration;Tactile cues;Verbal cues;Handout    Comprehension  Verbalized understanding;Returned demonstration;Verbal cues required;Tactile cues required          PT Long Term Goals - 08/13/17 1407      PT LONG TERM GOAL #1   Title  Improve posture and alignment with patient to demonstrated improved upright posture with posterior shoulder girdle engaged 09/16/17    Time  6    Period  Weeks    Status  On-going      PT LONG TERM GOAL #2   Title  Increase lateral cervical flexion by 5-10 degees to improve upper body function 09/16/17    Time  6    Period  Weeks    Status  On-going      PT LONG TERM GOAL #3   Title  Decrease pain Rt shoulder alloiwing patient to use Rt UE for normal functional activities including reaching to the side to place purse on seat beside her in her car 09/16/17    Time  6    Period  Weeks    Status  On-going      PT LONG TERM GOAL #4   Title  Independent in HEP 09/16/17    Time  6    Period  Weeks    Status  On-going      PT LONG TERM GOAL #5   Title  Improve FOTO to </= 30% limitation 09/16/17     Time  6    Period  Weeks    Status  On-going            Plan - 08/13/17  1442    Clinical Impression Statement  Patient has continued tightness through the anterior Rt shoulder. She added exercises without difficulty. Tolerated manual work well with good release of muscular tightness through pecs and anterior shoulder - deltoid/biceps.     Rehab Potential  Good    PT Frequency  2x / week    PT Duration  6 weeks    PT Treatment/Interventions  Patient/family education;ADLs/Self Care Home Management;Cryotherapy;Electrical Stimulation;Iontophoresis 4mg /ml Dexamethasone;Moist Heat;Ultrasound;Neuromuscular re-education;Dry needling;Manual techniques;Therapeutic activities;Therapeutic exercise    PT Next Visit Plan  review HEP; assess response to manual work Rt upper quarter and  snow angel;  progress exercises as indicated trial of posterior shoulder girdle exercises as tolerated; moist heat - no modalities     Consulted and Agree with Plan of Care  Patient       Patient will benefit from skilled therapeutic intervention in order to improve the following deficits and impairments:  Postural dysfunction, Improper body mechanics, Pain, Increased fascial restricitons, Increased muscle spasms, Hypomobility, Decreased mobility, Decreased range of motion, Decreased activity tolerance  Visit Diagnosis: Acute pain of right shoulder  Other symptoms and signs involving the musculoskeletal system  Abnormal posture  Muscle weakness (generalized)     Problem List Patient Active Problem List   Diagnosis Date Noted  . Subacromial bursitis of right shoulder joint 07/24/2017  . Trochanteric bursitis, right hip 07/24/2017  . Elevated blood pressure reading 06/01/2017  . History of adenomatous polyp of colon 01/02/2017  . Lymphopenia 07/03/2016  . Osteopenia of both lower legs 05/23/2016  . Iron deficiency anemia due to chronic blood loss 04/16/2016  . Malignant neoplasm of central portion of left breast in female, estrogen receptor positive ([place]) 04/09/2016  . First degree  ankle sprain, right, initial encounter 03/25/2016  . Meniere disease  08/04/2014  . Left lumbar radiculitis 04/11/2013  . Gastric ulcer 02/10/2011  . Hiatal hernia 02/07/2011  . OSTEOPENIA 12/02/2006  . HYPERLIPIDEMIA 10/20/2006  . Hyperlipidemia 10/20/2006  . HSV 06/16/2006  . Major depressive disorder, recurrent episode ([place]) 02/24/2006  . MIGRAINE, UNSPEC., W/O INTRACTABLE MIGRAINE 02/24/2006  . Asthma, moderate persistent 02/24/2006  . MENOPAUSAL SYNDROME 02/24/2006  . Menopausal syndrome 02/24/2006    Cotton Beckley Nilda Simmer PT, MPH  08/13/2017, 2:45 PM  [facility] [place], [place], [address] Phone: [phone]   Fax:  [phone]  Name: JAYRA CHOYCE MRN: [id number] Date of Birth: Sep 01, 1950

## 2017-08-17 ENCOUNTER — Ambulatory Visit (INDEPENDENT_AMBULATORY_CARE_PROVIDER_SITE_OTHER): Payer: Medicare Other | Admitting: Rehabilitative and Restorative Service Providers"

## 2017-08-17 ENCOUNTER — Encounter: Payer: Self-pay | Admitting: Rehabilitative and Restorative Service Providers"

## 2017-08-17 DIAGNOSIS — M25511 Pain in right shoulder: Secondary | ICD-10-CM

## 2017-08-17 DIAGNOSIS — R293 Abnormal posture: Secondary | ICD-10-CM

## 2017-08-17 DIAGNOSIS — R29898 Other symptoms and signs involving the musculoskeletal system: Secondary | ICD-10-CM

## 2017-08-17 NOTE — Therapy (Signed)
[place], [place], [address] Phone: [phone]   Fax:  [phone]  Physical Therapy Treatment  Patient Details  Name: [name] MRN: [id number] Date of Birth: 14-Jan-1951 Referring Provider: Dr Dianah Field    Encounter Date: 08/17/2017  PT End of Session - 08/17/17 1403    Visit Number  3    Number of Visits  12    Date for PT Re-Evaluation  09/16/17    PT Start Time  1400    PT Stop Time  1445    PT Time Calculation (min)  45 min    Activity Tolerance  Patient tolerated treatment well       Past Medical History:  Diagnosis Date  . Asthma   . Breast cancer ([place])   . HSV (herpes simplex virus) anogenital infection   . Hyperlipidemia   . Hypertension   . Meniere's disease   . Migraine with visual aura    optical  . Postmenopausal   . Volvulus ([place])     Past Surgical History:  Procedure Laterality Date  . COLON SURGERY  2003   resection/small bowel ischemia  . ESOPHAGEAL DILATION    . HERNIA REPAIR    . LTCS    . OVARIAN CYST REMOVAL    . SMALL INTESTINE SURGERY  02/2010  . TYMPANOSTOMY TUBE PLACEMENT     placement RT, out now.     There were no vitals filed for this visit.  Subjective Assessment - 08/17/17 1403    Subjective  A little bit of relief from the stretches and the treatment but the pain in the Rt shoulder is still there when she reaches out to the side or reaching across her body. She is lifting some with less pain but still guards the Rt by using the     Currently in Pain?  Yes    Pain Score  1  7/10 when reaching     Pain Location  Shoulder    Pain Orientation  Right    Pain Descriptors / Indicators  Aching    Pain Type  Acute pain                       [facility] Adult PT Treatment/Exercise - 08/17/17 0001      Shoulder Exercises: Stretch   Other Shoulder Stretches  3 way doorway stretch 30 sec x 2 reps each position     Other  Shoulder Stretches  lat stretch in supine 30 sec x 2 then 30 sec x 3 post manual work       Modalities   Modalities  -- no modalities d/t hx of breast cancer       Moist Heat Therapy   Number Minutes Moist Heat  15 Minutes    Moist Heat Location  Shoulder;Lumbar Spine Rt      Manual Therapy   Manual therapy comments  pt supine and prone     Joint Mobilization  GH joint mobs in multiple planes     Soft tissue mobilization  deep tissue work through the Rt lats/QL/lumbar paraspinals  pecs; upper trap; leveator; biceps; anterior deltoid; teres     Myofascial Release  Rt lumbar/lat area     Scapular Mobilization  Rt    Passive ROM  Rt shoulder              PT Education - 08/17/17 1432    Education provided  Yes  Education Details  HEP added lat stretch in supine     Person(s) Educated  Patient    Methods  Explanation;Demonstration;Tactile cues;Verbal cues    Comprehension  Verbalized understanding;Returned demonstration;Verbal cues required;Tactile cues required          PT Long Term Goals - 08/13/17 1407      PT LONG TERM GOAL #1   Title  Improve posture and alignment with patient to demonstrated improved upright posture with posterior shoulder girdle engaged 09/16/17    Time  6    Period  Weeks    Status  On-going      PT LONG TERM GOAL #2   Title  Increase lateral cervical flexion by 5-10 degees to improve upper body function 09/16/17    Time  6    Period  Weeks    Status  On-going      PT LONG TERM GOAL #3   Title  Decrease pain Rt shoulder alloiwing patient to use Rt UE for normal functional activities including reaching to the side to place purse on seat beside her in her car 09/16/17    Time  6    Period  Weeks    Status  On-going      PT LONG TERM GOAL #4   Title  Independent in HEP 09/16/17    Time  6    Period  Weeks    Status  On-going      PT LONG TERM GOAL #5   Title  Improve FOTO to </= 30% limitation 09/16/17     Time  6    Period  Weeks    Status   On-going            Plan - 08/17/17 1434    Clinical Impression Statement  Pain with resistive exercise (rows) and persistent pain with reaching out to the side. Patient has significant tightness through lats  with supine testing. Excellent response to manual work through lats into posterior and anterior Rt shoulder girdle musculature. Will add lat stretch for home and try DN if muscular tightness persists.     Rehab Potential  Good    PT Frequency  2x / week    PT Duration  6 weeks    PT Treatment/Interventions  Patient/family education;ADLs/Self Care Home Management;Cryotherapy;Electrical Stimulation;Iontophoresis 4mg /ml Dexamethasone;Moist Heat;Ultrasound;Neuromuscular re-education;Dry needling;Manual techniques;Therapeutic activities;Therapeutic exercise    PT Next Visit Plan  review HEP; assess response to manual work Rt lats/LB and upper quarter; add snow angel ?;  progress exercises as indicated trial of posterior shoulder girdle exercises as tolerated; moist heat - no modalities     Consulted and Agree with Plan of Care  Patient       Patient will benefit from skilled therapeutic intervention in order to improve the following deficits and impairments:  Postural dysfunction, Improper body mechanics, Pain, Increased fascial restricitons, Increased muscle spasms, Hypomobility, Decreased mobility, Decreased range of motion, Decreased activity tolerance  Visit Diagnosis: Acute pain of right shoulder  Other symptoms and signs involving the musculoskeletal system  Abnormal posture     Problem List Patient Active Problem List   Diagnosis Date Noted  . Subacromial bursitis of right shoulder joint 07/24/2017  . Trochanteric bursitis, right hip 07/24/2017  . Elevated blood pressure reading 06/01/2017  . History of adenomatous polyp of colon 01/02/2017  . Lymphopenia 07/03/2016  . Osteopenia of both lower legs 05/23/2016  . Iron deficiency anemia due to chronic blood loss  04/16/2016  . Malignant neoplasm  of central portion of left breast in female, estrogen receptor positive ([place]) 04/09/2016  . First degree ankle sprain, right, initial encounter 03/25/2016  . Meniere disease 08/04/2014  . Left lumbar radiculitis 04/11/2013  . Gastric ulcer 02/10/2011  . Hiatal hernia 02/07/2011  . OSTEOPENIA 12/02/2006  . HYPERLIPIDEMIA 10/20/2006  . Hyperlipidemia 10/20/2006  . HSV 06/16/2006  . Major depressive disorder, recurrent episode ([place]) 02/24/2006  . MIGRAINE, UNSPEC., W/O INTRACTABLE MIGRAINE 02/24/2006  . Asthma, moderate persistent 02/24/2006  . MENOPAUSAL SYNDROME 02/24/2006  . Menopausal syndrome 02/24/2006    Macai Sisneros Nilda Simmer PT, MPH  08/17/2017, 2:41 PM  [facility] [place], [place], [address] Phone: [phone]   Fax:  [phone]  Name: [name] MRN: [id number] Date of Birth: [birth year]

## 2017-08-19 ENCOUNTER — Ambulatory Visit (INDEPENDENT_AMBULATORY_CARE_PROVIDER_SITE_OTHER): Payer: Medicare Other | Admitting: Rehabilitative and Restorative Service Providers"

## 2017-08-19 ENCOUNTER — Encounter: Payer: Self-pay | Admitting: Rehabilitative and Restorative Service Providers"

## 2017-08-19 DIAGNOSIS — R293 Abnormal posture: Secondary | ICD-10-CM | POA: Diagnosis not present

## 2017-08-19 DIAGNOSIS — R29898 Other symptoms and signs involving the musculoskeletal system: Secondary | ICD-10-CM

## 2017-08-19 DIAGNOSIS — M25511 Pain in right shoulder: Secondary | ICD-10-CM

## 2017-08-19 NOTE — Therapy (Signed)
[place], [place], [address] Phone: [phone]   Fax:  [phone]  Physical Therapy Treatment  Patient Details  Name: [name] MRN: [id number] Date of Birth: 1951/03/12 Referring Provider: Dr Dianah Field    Encounter Date: 08/19/2017  PT End of Session - 08/19/17 1429    Visit Number  4    Number of Visits  12    Date for PT Re-Evaluation  09/16/17    PT Start Time  1400    PT Stop Time  1442    PT Time Calculation (min)  42 min    Activity Tolerance  Patient tolerated treatment well       Past Medical History:  Diagnosis Date  . Asthma   . Breast cancer ([place])   . HSV (herpes simplex virus) anogenital infection   . Hyperlipidemia   . Hypertension   . Meniere's disease   . Migraine with visual aura    optical  . Postmenopausal   . Volvulus ([place])     Past Surgical History:  Procedure Laterality Date  . COLON SURGERY  2003   resection/small bowel ischemia  . ESOPHAGEAL DILATION    . HERNIA REPAIR    . LTCS    . OVARIAN CYST REMOVAL    . SMALL INTESTINE SURGERY  02/2010  . TYMPANOSTOMY TUBE PLACEMENT     placement RT, out now.     There were no vitals filed for this visit.  Subjective Assessment - 08/19/17 1421    Subjective  Feeling a lot better today - thinks we are "on to it" with good improvement in the shoulder pain. She can open a door and carry or lift her purse with the Rt arm without pain. She has been working on her exercises and is pleased with her progress.     Currently in Pain?  Yes    Pain Score  3     Pain Location  Shoulder    Pain Orientation  Right    Pain Descriptors / Indicators  Aching;Tightness    Pain Type  Acute pain                       [facility] Adult PT Treatment/Exercise - 08/19/17 0001      Shoulder Exercises: Stretch   Other Shoulder Stretches  3 way doorway stretch 30 sec x 2 reps each position     Other Shoulder  Stretches  lat stretch in supine 30 sec x 2 then 30 sec x 3 post manual work  cat cow x 3; child's pose x 3 20-30 sec hold       Modalities   Modalities  -- no modalities d/t hx of breast cancer       Moist Heat Therapy   Number Minutes Moist Heat  18 Minutes    Moist Heat Location  Shoulder;Lumbar Spine Rt      Manual Therapy   Manual therapy comments  pt supine and prone     Joint Mobilization  GH joint mobs in multiple planes     Soft tissue mobilization  deep tissue work through the [employer] lats/QL/lumbar paraspinals  pecs; upper trap; leveator; biceps; anterior deltoid; teres     Myofascial Release  Rt lumbar/lat area     Scapular Mobilization  Rt    Passive ROM  Rt shoulder              PT Education -  08/19/17 1429    Education provided  Yes    Education Details  HEP     Person(s) Educated  Patient    Methods  Explanation;Demonstration;Tactile cues;Verbal cues;Handout    Comprehension  Verbalized understanding;Returned demonstration;Verbal cues required;Tactile cues required          PT Long Term Goals - 08/13/17 1407      PT LONG TERM GOAL #1   Title  Improve posture and alignment with patient to demonstrated improved upright posture with posterior shoulder girdle engaged 09/16/17    Time  6    Period  Weeks    Status  On-going      PT LONG TERM GOAL #2   Title  Increase lateral cervical flexion by 5-10 degees to improve upper body function 09/16/17    Time  6    Period  Weeks    Status  On-going      PT LONG TERM GOAL #3   Title  Decrease pain Rt shoulder alloiwing patient to use Rt UE for normal functional activities including reaching to the side to place purse on seat beside her in her car 09/16/17    Time  6    Period  Weeks    Status  On-going      PT LONG TERM GOAL #4   Title  Independent in HEP 09/16/17    Time  6    Period  Weeks    Status  On-going      PT LONG TERM GOAL #5   Title  Improve FOTO to </= 30% limitation 09/16/17     Time  6    Period   Weeks    Status  On-going            Plan - 08/19/17 1430    Clinical Impression Statement  Excellent response to manual work through the [employer] through the lumbar spine into the Rt shoulder. Patient would like to continue current treatment course before considering DN. She has significant improvment in tissue extensibilty today compared with last visit. Progressing well toward stated goals of therapy.     Rehab Potential  Good    PT Frequency  2x / week    PT Duration  6 weeks    PT Treatment/Interventions  Patient/family education;ADLs/Self Care Home Management;Cryotherapy;Electrical Stimulation;Iontophoresis 4mg /ml Dexamethasone;Moist Heat;Ultrasound;Neuromuscular re-education;Dry needling;Manual techniques;Therapeutic activities;Therapeutic exercise    PT Next Visit Plan  review HEP; assess response to manual work Rt lats/LB and upper quarter; add snow angel ?;  progress exercises as indicated trial of posterior shoulder girdle exercises as tolerated; moist heat - no modalities     Consulted and Agree with Plan of Care  Patient       Patient will benefit from skilled therapeutic intervention in order to improve the following deficits and impairments:  Postural dysfunction, Improper body mechanics, Pain, Increased fascial restricitons, Increased muscle spasms, Hypomobility, Decreased mobility, Decreased range of motion, Decreased activity tolerance  Visit Diagnosis: Acute pain of right shoulder  Other symptoms and signs involving the musculoskeletal system  Abnormal posture     Problem List Patient Active Problem List   Diagnosis Date Noted  . Subacromial bursitis of right shoulder joint 07/24/2017  . Trochanteric bursitis, right hip 07/24/2017  . Elevated blood pressure reading 06/01/2017  . History of adenomatous polyp of colon 01/02/2017  . Lymphopenia 07/03/2016  . Osteopenia of both lower legs 05/23/2016  . Iron deficiency anemia due to chronic blood loss 04/16/2016   .  Malignant neoplasm of central portion of left breast in female, estrogen receptor positive ([place]) 04/09/2016  . First degree ankle sprain, right, initial encounter 03/25/2016  . Meniere disease 08/04/2014  . Left lumbar radiculitis 04/11/2013  . Gastric ulcer 02/10/2011  . Hiatal hernia 02/07/2011  . OSTEOPENIA 12/02/2006  . HYPERLIPIDEMIA 10/20/2006  . Hyperlipidemia 10/20/2006  . HSV 06/16/2006  . Major depressive disorder, recurrent episode ([place]) 02/24/2006  . MIGRAINE, UNSPEC., W/O INTRACTABLE MIGRAINE 02/24/2006  . Asthma, moderate persistent 02/24/2006  . MENOPAUSAL SYNDROME 02/24/2006  . Menopausal syndrome 02/24/2006    Javyn Havlin Nilda Simmer PT, MPH  08/19/2017, 2:33 PM  [facility] [place], [place], [address] Phone: [phone]   Fax:  [phone]  Name: SHANYN PREISLER MRN: [id number] Date of Birth: 1951/05/18

## 2017-08-19 NOTE — Patient Instructions (Signed)
Cat / Cow Flow    Inhale, press spine toward ceiling like a Halloween cat. Keeping strength in arms and abdominals, exhale to soften spine through neutral and into cow pose. Open chest and arch back. Initiate movement between cat and cow at tailbone, one vertebrae at a time. Repeat __3-5__ times.  BACK: Child's Pose (Sciatica)    Sit in knee-chest position and reach arms forward. Separate knees for comfort. Hold position for _20-30 sec Repeat _2-3__ times. Do _2__ times per day.

## 2017-08-20 ENCOUNTER — Encounter: Payer: Medicare Other | Admitting: Rehabilitative and Restorative Service Providers"

## 2017-08-24 ENCOUNTER — Encounter: Payer: Self-pay | Admitting: Sports Medicine

## 2017-08-26 ENCOUNTER — Ambulatory Visit (INDEPENDENT_AMBULATORY_CARE_PROVIDER_SITE_OTHER): Payer: Medicare Other | Admitting: Rehabilitative and Restorative Service Providers"

## 2017-08-26 ENCOUNTER — Encounter: Payer: Self-pay | Admitting: Rehabilitative and Restorative Service Providers"

## 2017-08-26 DIAGNOSIS — M25511 Pain in right shoulder: Secondary | ICD-10-CM

## 2017-08-26 DIAGNOSIS — R29898 Other symptoms and signs involving the musculoskeletal system: Secondary | ICD-10-CM

## 2017-08-26 DIAGNOSIS — R293 Abnormal posture: Secondary | ICD-10-CM | POA: Diagnosis not present

## 2017-08-26 NOTE — Therapy (Addendum)
[facility] [place], [place], [address] Phone: [phone]   Fax:  [phone]  Physical Therapy Treatment  Patient Details  Name: [name] MRN: [id number] Date of Birth: 1951/04/15 Referring Provider: Dr Dianah Field   Encounter Date: 08/26/2017  PT End of Session - 08/26/17 1405    Visit Number  5    Number of Visits  12    Date for PT Re-Evaluation  09/16/17    PT Start Time  1401    PT Stop Time  1452    PT Time Calculation (min)  51 min    Activity Tolerance  Patient tolerated treatment well       Past Medical History:  Diagnosis Date  . Asthma   . Breast cancer ([place])   . HSV (herpes simplex virus) anogenital infection   . Hyperlipidemia   . Hypertension   . Meniere's disease   . Migraine with visual aura    optical  . Postmenopausal   . Volvulus ([place])     Past Surgical History:  Procedure Laterality Date  . COLON SURGERY  2003   resection/small bowel ischemia  . ESOPHAGEAL DILATION    . HERNIA REPAIR    . LTCS    . OVARIAN CYST REMOVAL    . SMALL INTESTINE SURGERY  02/2010  . TYMPANOSTOMY TUBE PLACEMENT     placement RT, out now.     There were no vitals filed for this visit.  Subjective Assessment - 08/26/17 1430    Subjective  Continues to improve. She was able to return to some light rows at the gym yesterday. She feels she is at least 80% improved overall since beginning therapy. [name] would like to try independent HEP until she returns to MD in a couple of weeks. She will call if she has increased pain or limitations in function Rt UE.     Currently in Pain?  No/denies         [facility] PT Assessment - 08/26/17 0001      Assessment   Medical Diagnosis  Rt shoulder dysfunction; Rt trochanteric bursitis     Referring Provider  Dr Dianah Field    Onset Date/Surgical Date  06/19/17    Hand Dominance  Right    Next MD Visit  09/13/17    Prior Therapy  none for this  problem       Precautions   Precaution Comments  Cancer - Lt breast 2017 - NO MODALITIES unless cleared by oncologist       Observation/Other Assessments   Focus on Therapeutic Outcomes (FOTO)   37% limitation       Posture/Postural Control   Posture Comments  good improvement in posture and alignment       AROM   Right Shoulder Extension  68 Degrees    Right Shoulder Flexion  160 Degrees    Right Shoulder ABduction  163 Degrees    Right Shoulder Internal Rotation  36 Degrees    Right Shoulder External Rotation  94 Degrees    Left Shoulder Extension  45 Degrees    Left Shoulder Flexion  152 Degrees    Left Shoulder ABduction  154 Degrees    Left Shoulder Internal Rotation  40 Degrees    Left Shoulder External Rotation  90 Degrees    Cervical Flexion  70    Cervical Extension  53    Cervical - Right Side Bend  43    Cervical -  Left Side Bend  35    Cervical - Right Rotation  85    Cervical - Left Rotation  85      Strength   Overall Strength Comments  5/5 bilat UE's pain with resistive testing Rt shd flex/abd/ER      Palpation   Spinal mobility  WFl's     Palpation comment  decreased muscular tightness through lats; pecs; upper trap; leveaator; teres; biceps; deltoid                    [facility] Adult PT Treatment/Exercise - 08/26/17 0001      Shoulder Exercises: Standing   Extension  Strengthening;Right;Left;20 reps;Theraband    Theraband Level (Shoulder Extension)  Level 2 (Red)    Row  Strengthening;Right;Left;20 reps;Theraband    Theraband Level (Shoulder Row)  Level 2 (Red)    Retraction  Strengthening;Right;Left;20 reps;Theraband    Theraband Level (Shoulder Retraction)  Level 2 (Red)    Other Standing Exercises  axial extensioin 10 sec x 5; scap squeeze 10 sec x 10; L's x10; W's x 10 with swim noodle       Shoulder Exercises: Stretch   Wall Stretch - ABduction  2 reps;30 seconds horizontal abduction stretch in standing at wall     Other Shoulder  Stretches  3 way doorway stretch 30 sec x 2 reps each position     Other Shoulder Stretches  lat stretch in supine 30 sec x 2 then 30 sec x 3 post manual work  cat cow x 3; child's pose x 3 20-30 sec hold       Moist Heat Therapy   Number Minutes Moist Heat  20 Minutes    Moist Heat Location  Shoulder;Lumbar Spine Rt      Manual Therapy   Manual therapy comments  pt supine and prone     Joint Mobilization  GH joint mobs in multiple planes     Soft tissue mobilization  deep tissue work through the [employer] lats/QL/lumbar paraspinals  pecs; upper trap; leveator; biceps; anterior deltoid; teres     Myofascial Release  Rt lumbar/lat area     Scapular Mobilization  Rt    Passive ROM  Rt shoulder                   PT Long Term Goals - 08/26/17 1406      PT LONG TERM GOAL #1   Title  Improve posture and alignment with patient to demonstrated improved upright posture with posterior shoulder girdle engaged 09/16/17    Time  6    Period  Weeks    Status  Achieved      PT LONG TERM GOAL #2   Title  Increase lateral cervical flexion by 5-10 degees to improve upper body function 09/16/17    Time  6    Period  Weeks    Status  Achieved      PT LONG TERM GOAL #3   Title  Decrease pain Rt shoulder alloiwing patient to use Rt UE for normal functional activities including reaching to the side to place purse on seat beside her in her car 09/16/17    Time  6    Period  Weeks    Status  Achieved      PT LONG TERM GOAL #4   Title  Independent in HEP 09/16/17    Time  6    Period  Weeks    Status  Achieved  PT LONG TERM GOAL #5   Title  Improve FOTO to </= 30% limitation 09/16/17     Time  6    Period  Weeks    Status  Partially Met            Plan - 08/26/17 1406    Clinical Impression Statement  Continued progress with shoulder and back pain. Patient demonstrates increased ROM in cervical spine and Rt shoulder. She reports increased functional activity level with decreased pain.  Progressing well toward remainder of rehab goals. [name] will continue with independent HEP and call if she has any incresae in symptoms or feels she needs to return for treatment before MD appointment.     Rehab Potential  Good    PT Frequency  2x / week    PT Duration  6 weeks    PT Treatment/Interventions  Patient/family education;ADLs/Self Care Home Management;Cryotherapy;Electrical Stimulation;Iontophoresis 21m/ml Dexamethasone;Moist Heat;Ultrasound;Neuromuscular re-education;Dry needling;Manual techniques;Therapeutic activities;Therapeutic exercise    PT Next Visit Plan  review HEP; assess response to manual work Rt lats/LB and upper quarter; add snow angel ?;  progress exercises as indicated trial of posterior shoulder girdle exercises as tolerated; moist heat - no modalities     Consulted and Agree with Plan of Care  Patient       Patient will benefit from skilled therapeutic intervention in order to improve the following deficits and impairments:  Postural dysfunction, Improper body mechanics, Pain, Increased fascial restricitons, Increased muscle spasms, Hypomobility, Decreased mobility, Decreased range of motion, Decreased activity tolerance  Visit Diagnosis: Acute pain of right shoulder  Other symptoms and signs involving the musculoskeletal system  Abnormal posture     Problem List Patient Active Problem List   Diagnosis Date Noted  . Subacromial bursitis of right shoulder joint 07/24/2017  . Trochanteric bursitis, right hip 07/24/2017  . Elevated blood pressure reading 06/01/2017  . History of adenomatous polyp of colon 01/02/2017  . Lymphopenia 07/03/2016  . Osteopenia of both lower legs 05/23/2016  . Iron deficiency anemia due to chronic blood loss 04/16/2016  . Malignant neoplasm of central portion of left breast in female, estrogen receptor positive ([place] 04/09/2016  . First degree ankle sprain, right, initial encounter 03/25/2016  . Meniere disease 08/04/2014  .  Left lumbar radiculitis 04/11/2013  . Gastric ulcer 02/10/2011  . Hiatal hernia 02/07/2011  . OSTEOPENIA 12/02/2006  . HYPERLIPIDEMIA 10/20/2006  . Hyperlipidemia 10/20/2006  . HSV 06/16/2006  . Major depressive disorder, recurrent episode ([place] 02/24/2006  . MIGRAINE, UNSPEC., W/O INTRACTABLE MIGRAINE 02/24/2006  . Asthma, moderate persistent 02/24/2006  . MENOPAUSAL SYNDROME 02/24/2006  . Menopausal syndrome 02/24/2006    [name] PNilda SimmerPT, MPH  08/26/2017, 2:57 PM  [facility] [address] [address]Phone: [phone]  Fax:  [phone] Name: Abigail BUNTINMRN: [id number] of Birth: 9Jan 08, 1952 PHYSICAL THERAPY DISCHARGE SUMMARY  Visits from Start of Care: 5  Current functional level related to goals / functional outcomes: Excellent progress with resolution of all symptoms  See progress note for details   Remaining deficits: No known deficits - should continue with HEP    Education / Equipment: HEP  Plan: Patient agrees to discharge.  Patient goals were met. Patient is being discharged due to meeting the stated rehab goals.  ?????    Blake Goya P. HHelene KelpPT, MPH 09/10/17 10:26 AM

## 2017-08-28 ENCOUNTER — Encounter: Payer: Medicare Other | Admitting: Rehabilitative and Restorative Service Providers"

## 2017-09-01 ENCOUNTER — Ambulatory Visit (INDEPENDENT_AMBULATORY_CARE_PROVIDER_SITE_OTHER): Payer: Medicare Other | Admitting: Sports Medicine

## 2017-09-01 ENCOUNTER — Encounter: Payer: Self-pay | Admitting: Sports Medicine

## 2017-09-01 DIAGNOSIS — M7551 Bursitis of right shoulder: Secondary | ICD-10-CM

## 2017-09-01 DIAGNOSIS — M7061 Trochanteric bursitis, right hip: Secondary | ICD-10-CM | POA: Diagnosis not present

## 2017-09-01 NOTE — Progress Notes (Signed)
Subjective:    CC: Follow-up  HPI: This is a pleasant 67 year old female, she returns for follow-up of trochanteric bursitis and subacromial bursitis, both have resolved with conservative measures, NSAIDs and physical therapy, no further evaluation needed.  Happy with results.  I reviewed the past medical history, family history, social history, surgical history, and allergies today and no changes were needed.  Please see the problem list section below in epic for further details.  Past Medical History: Past Medical History:  Diagnosis Date  . Asthma   . Breast cancer ([place])   . HSV (herpes simplex virus) anogenital infection   . Hyperlipidemia   . Hypertension   . Meniere's disease   . Migraine with visual aura    optical  . Postmenopausal   . Volvulus ([place])    Past Surgical History: Past Surgical History:  Procedure Laterality Date  . COLON SURGERY  2003   resection/small bowel ischemia  . ESOPHAGEAL DILATION    . HERNIA REPAIR    . LTCS    . OVARIAN CYST REMOVAL    . SMALL INTESTINE SURGERY  02/2010  . TYMPANOSTOMY TUBE PLACEMENT     placement RT, out now.    Social History: Social History   Socioeconomic History  . Marital status: Married    Spouse name: Not on file  . Number of children: Not on file  . Years of education: Not on file  . Highest education level: Not on file  Occupational History  . Occupation: Environmental health practitioner    Comment: [employer]  . Financial resource strain: Not on file  . Food insecurity:    Worry: Not on file    Inability: Not on file  . Transportation needs:    Medical: Not on file    Non-medical: Not on file  Tobacco Use  . Smoking status: Never Smoker  . Smokeless tobacco: Never Used  Substance and Sexual Activity  . Alcohol use: Yes    Alcohol/week: 1.8 oz    Types: 3 Glasses of wine per week    Comment: per week  . Drug use: No  . Sexual activity: Yes    Partners: Male  Lifestyle  . Physical activity:   Days per week: Not on file    Minutes per session: Not on file  . Stress: Not on file  Relationships  . Social connections:    Talks on phone: Not on file    Gets together: Not on file    Attends religious service: Not on file    Active member of club or organization: Not on file    Attends meetings of clubs or organizations: Not on file    Relationship status: Not on file  Other Topics Concern  . Not on file  Social History Narrative   Working out twice per week.     Family History: Family History  Problem Relation Age of Onset  . Hypertension Mother   . Hypertension Father   . Depression Father   . Glaucoma Father   . Parkinsonism Father   . Depression Sister   . Depression Sister    Allergies: Allergies  Allergen Reactions  . Codeine     REACTION: hyper   Medications: See med rec.  Review of Systems: No fevers, chills, night sweats, weight loss, chest pain, or shortness of breath.   Objective:    General: Well Developed, well nourished, and in no acute distress.  Neuro: Alert and oriented x3, extra-ocular muscles intact,  sensation grossly intact.  HEENT: Normocephalic, atraumatic, pupils equal round reactive to light, neck supple, no masses, no lymphadenopathy, thyroid nonpalpable.  Skin: Warm and dry, no rashes. Cardiac: Regular rate and rhythm, no murmurs rubs or gallops, no lower extremity edema.  Respiratory: Clear to auscultation bilaterally. Not using accessory muscles, speaking in full sentences.  Impression and Recommendations:    Subacromial bursitis of right shoulder joint Almost completely resolved with therapy.   Return as needed.  Trochanteric bursitis, right hip Almost completely resolved with therapy.   Return as needed. ___________________________________________ Gwen Her. Dianah Field, M.D., ABFM., CAQSM. Primary Care and [place] Instructor of [employer] [facility] of Medicine

## 2017-09-01 NOTE — Assessment & Plan Note (Signed)
Almost completely resolved with therapy.   Return as needed.

## 2017-09-03 ENCOUNTER — Ambulatory Visit: Payer: Medicare Other | Admitting: Sports Medicine

## 2017-10-30 ENCOUNTER — Other Ambulatory Visit: Payer: Self-pay | Admitting: Family Medicine

## 2017-12-08 ENCOUNTER — Other Ambulatory Visit: Payer: Self-pay | Admitting: Family Medicine

## 2017-12-12 DIAGNOSIS — L509 Urticaria, unspecified: Secondary | ICD-10-CM | POA: Diagnosis not present

## 2017-12-12 DIAGNOSIS — M5136 Other intervertebral disc degeneration, lumbar region: Secondary | ICD-10-CM | POA: Diagnosis not present

## 2017-12-12 DIAGNOSIS — D500 Iron deficiency anemia secondary to blood loss (chronic): Secondary | ICD-10-CM | POA: Diagnosis not present

## 2017-12-12 DIAGNOSIS — M50322 Other cervical disc degeneration at C5-C6 level: Secondary | ICD-10-CM | POA: Diagnosis not present

## 2017-12-12 DIAGNOSIS — S069X9A Unspecified intracranial injury with loss of consciousness of unspecified duration, initial encounter: Secondary | ICD-10-CM | POA: Diagnosis not present

## 2017-12-12 DIAGNOSIS — C50112 Malignant neoplasm of central portion of left female breast: Secondary | ICD-10-CM | POA: Diagnosis not present

## 2017-12-12 DIAGNOSIS — S065XAA Traumatic subdural hemorrhage with loss of consciousness status unknown, initial encounter: Secondary | ICD-10-CM | POA: Insufficient documentation

## 2017-12-12 DIAGNOSIS — H8109 Meniere's disease, unspecified ear: Secondary | ICD-10-CM | POA: Diagnosis not present

## 2017-12-12 DIAGNOSIS — S0101XA Laceration without foreign body of scalp, initial encounter: Secondary | ICD-10-CM | POA: Insufficient documentation

## 2017-12-12 DIAGNOSIS — S0990XA Unspecified injury of head, initial encounter: Secondary | ICD-10-CM | POA: Diagnosis not present

## 2017-12-12 DIAGNOSIS — Z170 Estrogen receptor positive status [ER+]: Secondary | ICD-10-CM | POA: Diagnosis not present

## 2017-12-12 DIAGNOSIS — S3993XA Unspecified injury of pelvis, initial encounter: Secondary | ICD-10-CM | POA: Diagnosis not present

## 2017-12-12 DIAGNOSIS — S3991XA Unspecified injury of abdomen, initial encounter: Secondary | ICD-10-CM | POA: Diagnosis not present

## 2017-12-12 DIAGNOSIS — M47892 Other spondylosis, cervical region: Secondary | ICD-10-CM | POA: Diagnosis not present

## 2017-12-12 DIAGNOSIS — D259 Leiomyoma of uterus, unspecified: Secondary | ICD-10-CM | POA: Diagnosis not present

## 2017-12-12 DIAGNOSIS — Z041 Encounter for examination and observation following transport accident: Secondary | ICD-10-CM | POA: Diagnosis not present

## 2017-12-12 DIAGNOSIS — E871 Hypo-osmolality and hyponatremia: Secondary | ICD-10-CM | POA: Insufficient documentation

## 2017-12-12 DIAGNOSIS — J329 Chronic sinusitis, unspecified: Secondary | ICD-10-CM | POA: Diagnosis not present

## 2017-12-12 DIAGNOSIS — M47896 Other spondylosis, lumbar region: Secondary | ICD-10-CM | POA: Diagnosis not present

## 2017-12-12 DIAGNOSIS — S065X0A Traumatic subdural hemorrhage without loss of consciousness, initial encounter: Secondary | ICD-10-CM | POA: Diagnosis not present

## 2017-12-12 DIAGNOSIS — M545 Low back pain: Secondary | ICD-10-CM | POA: Diagnosis not present

## 2017-12-12 DIAGNOSIS — F339 Major depressive disorder, recurrent, unspecified: Secondary | ICD-10-CM | POA: Diagnosis not present

## 2017-12-12 DIAGNOSIS — S3992XA Unspecified injury of lower back, initial encounter: Secondary | ICD-10-CM | POA: Diagnosis not present

## 2017-12-12 DIAGNOSIS — E785 Hyperlipidemia, unspecified: Secondary | ICD-10-CM | POA: Diagnosis not present

## 2017-12-12 DIAGNOSIS — J4530 Mild persistent asthma, uncomplicated: Secondary | ICD-10-CM | POA: Diagnosis not present

## 2017-12-12 DIAGNOSIS — S299XXA Unspecified injury of thorax, initial encounter: Secondary | ICD-10-CM | POA: Diagnosis not present

## 2017-12-12 DIAGNOSIS — J984 Other disorders of lung: Secondary | ICD-10-CM | POA: Diagnosis not present

## 2017-12-12 DIAGNOSIS — H8103 Meniere's disease, bilateral: Secondary | ICD-10-CM | POA: Diagnosis not present

## 2017-12-12 DIAGNOSIS — R9082 White matter disease, unspecified: Secondary | ICD-10-CM | POA: Diagnosis not present

## 2017-12-12 DIAGNOSIS — S20229A Contusion of unspecified back wall of thorax, initial encounter: Secondary | ICD-10-CM | POA: Insufficient documentation

## 2017-12-12 DIAGNOSIS — S065X9A Traumatic subdural hemorrhage with loss of consciousness of unspecified duration, initial encounter: Secondary | ICD-10-CM | POA: Diagnosis not present

## 2017-12-12 DIAGNOSIS — M50321 Other cervical disc degeneration at C4-C5 level: Secondary | ICD-10-CM | POA: Diagnosis not present

## 2017-12-12 DIAGNOSIS — S060X9A Concussion with loss of consciousness of unspecified duration, initial encounter: Secondary | ICD-10-CM | POA: Diagnosis not present

## 2017-12-12 DIAGNOSIS — Z79899 Other long term (current) drug therapy: Secondary | ICD-10-CM | POA: Diagnosis not present

## 2017-12-12 DIAGNOSIS — M47897 Other spondylosis, lumbosacral region: Secondary | ICD-10-CM | POA: Diagnosis not present

## 2017-12-12 DIAGNOSIS — S20229D Contusion of unspecified back wall of thorax, subsequent encounter: Secondary | ICD-10-CM | POA: Diagnosis not present

## 2017-12-12 DIAGNOSIS — R930 Abnormal findings on diagnostic imaging of skull and head, not elsewhere classified: Secondary | ICD-10-CM | POA: Diagnosis not present

## 2017-12-12 DIAGNOSIS — I6200 Nontraumatic subdural hemorrhage, unspecified: Secondary | ICD-10-CM | POA: Diagnosis not present

## 2017-12-12 DIAGNOSIS — J9811 Atelectasis: Secondary | ICD-10-CM | POA: Diagnosis not present

## 2017-12-12 DIAGNOSIS — R51 Headache: Secondary | ICD-10-CM | POA: Diagnosis not present

## 2017-12-13 DIAGNOSIS — D500 Iron deficiency anemia secondary to blood loss (chronic): Secondary | ICD-10-CM | POA: Diagnosis not present

## 2017-12-13 DIAGNOSIS — S20229D Contusion of unspecified back wall of thorax, subsequent encounter: Secondary | ICD-10-CM | POA: Diagnosis not present

## 2017-12-13 DIAGNOSIS — S0990XA Unspecified injury of head, initial encounter: Secondary | ICD-10-CM | POA: Diagnosis not present

## 2017-12-13 DIAGNOSIS — S060X9A Concussion with loss of consciousness of unspecified duration, initial encounter: Secondary | ICD-10-CM | POA: Diagnosis not present

## 2017-12-13 DIAGNOSIS — R930 Abnormal findings on diagnostic imaging of skull and head, not elsewhere classified: Secondary | ICD-10-CM | POA: Diagnosis not present

## 2017-12-13 DIAGNOSIS — H8103 Meniere's disease, bilateral: Secondary | ICD-10-CM | POA: Diagnosis not present

## 2017-12-13 DIAGNOSIS — E871 Hypo-osmolality and hyponatremia: Secondary | ICD-10-CM | POA: Diagnosis not present

## 2017-12-13 DIAGNOSIS — R9082 White matter disease, unspecified: Secondary | ICD-10-CM | POA: Diagnosis not present

## 2017-12-13 DIAGNOSIS — S065X1A Traumatic subdural hemorrhage with loss of consciousness of 30 minutes or less, initial encounter: Secondary | ICD-10-CM | POA: Diagnosis not present

## 2017-12-13 DIAGNOSIS — S069X9A Unspecified intracranial injury with loss of consciousness of unspecified duration, initial encounter: Secondary | ICD-10-CM | POA: Diagnosis not present

## 2017-12-13 DIAGNOSIS — I6200 Nontraumatic subdural hemorrhage, unspecified: Secondary | ICD-10-CM | POA: Diagnosis not present

## 2017-12-14 DIAGNOSIS — S069X9A Unspecified intracranial injury with loss of consciousness of unspecified duration, initial encounter: Secondary | ICD-10-CM | POA: Diagnosis not present

## 2017-12-14 DIAGNOSIS — S0990XA Unspecified injury of head, initial encounter: Secondary | ICD-10-CM | POA: Diagnosis not present

## 2017-12-14 DIAGNOSIS — H8103 Meniere's disease, bilateral: Secondary | ICD-10-CM | POA: Diagnosis not present

## 2017-12-14 DIAGNOSIS — S0101XA Laceration without foreign body of scalp, initial encounter: Secondary | ICD-10-CM | POA: Diagnosis not present

## 2017-12-14 DIAGNOSIS — E871 Hypo-osmolality and hyponatremia: Secondary | ICD-10-CM | POA: Diagnosis not present

## 2017-12-14 DIAGNOSIS — S20229D Contusion of unspecified back wall of thorax, subsequent encounter: Secondary | ICD-10-CM | POA: Diagnosis not present

## 2017-12-14 DIAGNOSIS — D500 Iron deficiency anemia secondary to blood loss (chronic): Secondary | ICD-10-CM | POA: Diagnosis not present

## 2017-12-15 ENCOUNTER — Ambulatory Visit: Payer: Medicare Other | Admitting: Family Medicine

## 2017-12-21 ENCOUNTER — Ambulatory Visit (INDEPENDENT_AMBULATORY_CARE_PROVIDER_SITE_OTHER): Payer: Medicare Other | Admitting: Family Medicine

## 2017-12-21 ENCOUNTER — Encounter: Payer: Self-pay | Admitting: Family Medicine

## 2017-12-21 VITALS — BP 136/77 | HR 85 | Ht 62.0 in | Wt 147.0 lb

## 2017-12-21 DIAGNOSIS — S0101XA Laceration without foreign body of scalp, initial encounter: Secondary | ICD-10-CM | POA: Diagnosis not present

## 2017-12-21 DIAGNOSIS — M545 Low back pain, unspecified: Secondary | ICD-10-CM

## 2017-12-21 DIAGNOSIS — S0990XA Unspecified injury of head, initial encounter: Secondary | ICD-10-CM | POA: Diagnosis not present

## 2017-12-21 DIAGNOSIS — D649 Anemia, unspecified: Secondary | ICD-10-CM | POA: Diagnosis not present

## 2017-12-21 DIAGNOSIS — E871 Hypo-osmolality and hyponatremia: Secondary | ICD-10-CM | POA: Diagnosis not present

## 2017-12-21 DIAGNOSIS — S065X1A Traumatic subdural hemorrhage with loss of consciousness of 30 minutes or less, initial encounter: Secondary | ICD-10-CM | POA: Diagnosis not present

## 2017-12-21 DIAGNOSIS — S060X1A Concussion with loss of consciousness of 30 minutes or less, initial encounter: Secondary | ICD-10-CM

## 2017-12-21 NOTE — Patient Instructions (Signed)
Call if we need to schedule your CT
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
## 2017-12-22 ENCOUNTER — Ambulatory Visit: Payer: Medicare Other | Admitting: Family Medicine

## 2017-12-22 DIAGNOSIS — I6200 Nontraumatic subdural hemorrhage, unspecified: Secondary | ICD-10-CM | POA: Diagnosis not present

## 2017-12-22 DIAGNOSIS — S065X9A Traumatic subdural hemorrhage with loss of consciousness of unspecified duration, initial encounter: Secondary | ICD-10-CM | POA: Diagnosis not present

## 2017-12-22 LAB — BASIC METABOLIC PANEL WITH GFR
BUN: 13 mg/dL (ref 7–25)
CALCIUM: 9.2 mg/dL (ref 8.6–10.4)
CO2: 28 mmol/L (ref 20–32)
Chloride: 95 mmol/L — ABNORMAL LOW (ref 98–110)
Creat: 0.7 mg/dL (ref 0.50–0.99)
GFR, EST AFRICAN AMERICAN: 105 mL/min/{1.73_m2} (ref >=60)
GFR, EST NON AFRICAN AMERICAN: 90 mL/min/{1.73_m2} (ref >=60)
Glucose, Bld: 102 mg/dL — ABNORMAL HIGH (ref 65–99)
POTASSIUM: 4.2 mmol/L (ref 3.5–5.3)
Sodium: 132 mmol/L — ABNORMAL LOW (ref 135–146)

## 2017-12-22 LAB — CBC WITH DIFFERENTIAL/PLATELET
BASOS PCT: 0.6 %
Basophils Absolute: 39 cells/uL (ref 0–200)
Eosinophils Absolute: 325 cells/uL (ref 15–500)
Eosinophils Relative: 5 %
HCT: 32.9 % — ABNORMAL LOW (ref 35.0–45.0)
Hemoglobin: 10.7 g/dL — ABNORMAL LOW (ref 11.7–15.5)
Lymphs Abs: 1066 cells/uL (ref 850–3900)
MCH: 25.9 pg — ABNORMAL LOW (ref 27.0–33.0)
MCHC: 32.5 g/dL (ref 32.0–36.0)
MCV: 79.7 fL — ABNORMAL LOW (ref 80.0–100.0)
MONOS PCT: 11.5 %
MPV: 9 fL (ref 7.5–12.5)
Neutro Abs: 4323 cells/uL (ref 1500–7800)
Neutrophils Relative %: 66.5 %
PLATELETS: 476 10*3/uL — AB (ref 140–400)
RBC: 4.13 10*6/uL (ref 3.80–5.10)
RDW: 18.1 % — ABNORMAL HIGH (ref 11.0–15.0)
TOTAL LYMPHOCYTE: 16.4 %
WBC: 6.5 10*3/uL (ref 3.8–10.8)
WBCMIX: 748 {cells}/uL (ref 200–950)

## 2017-12-22 LAB — FERRITIN: Ferritin: 27 ng/mL (ref 16–288)

## 2017-12-23 ENCOUNTER — Other Ambulatory Visit: Payer: Self-pay

## 2017-12-23 ENCOUNTER — Other Ambulatory Visit: Payer: Self-pay | Admitting: Family Medicine

## 2017-12-23 DIAGNOSIS — E871 Hypo-osmolality and hyponatremia: Secondary | ICD-10-CM

## 2017-12-23 NOTE — Progress Notes (Signed)
Per lab notes from 12-21-17 "Sodium level is still low at 132. Have her cut her Maxide in half and let us recheck a BMP in 2 weeks.."  Lab orders entered and faxed

## 2017-12-24 ENCOUNTER — Telehealth: Payer: Self-pay

## 2017-12-24 DIAGNOSIS — M545 Low back pain, unspecified: Secondary | ICD-10-CM
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
## 2017-12-24 NOTE — Telephone Encounter (Signed)
Referral placed.

## 2017-12-27 ENCOUNTER — Other Ambulatory Visit: Payer: Self-pay | Admitting: Family Medicine

## 2017-12-29 ENCOUNTER — Encounter: Payer: Self-pay | Admitting: Rehabilitative and Restorative Service Providers"

## 2017-12-29 ENCOUNTER — Ambulatory Visit (INDEPENDENT_AMBULATORY_CARE_PROVIDER_SITE_OTHER): Payer: Medicare Other | Admitting: Rehabilitative and Restorative Service Providers"

## 2017-12-29 DIAGNOSIS — M545 Low back pain, unspecified: Secondary | ICD-10-CM

## 2017-12-29 DIAGNOSIS — R293 Abnormal posture: Secondary | ICD-10-CM

## 2017-12-29 DIAGNOSIS — R29898 Other symptoms and signs involving the musculoskeletal system: Secondary | ICD-10-CM

## 2017-12-29 NOTE — Patient Instructions (Signed)
HIP: Hamstrings - Supine  Place strap around foot. Raise leg up, keeping knee straight.  Bend opposite knee to protect back if indicated. Hold 30 seconds. 3 reps per set, 2-3 sets per day   Piriformis Stretch   Lying on back, pull right knee toward opposite shoulder. Hold 30 seconds. Repeat 3 times. Do 2-3 sessions per day.  Abdominal Bracing With Pelvic Floor (Hook-Lying)    With neutral spine, tighten pelvic floor and abdominals sucking belly button to back bone; tighten muscles in the LB at waist. Hold 10 sec . Repeat _10__ times. Do _several__ times a day. Progress to do this in sitting, standing, walking and with functional activities    Sleeping on Back  Place pillow under knees. A pillow with cervical support and a roll around waist are also helpful. Copyright  VHI. All rights reserved.  Sleeping on Side Place pillow between knees. Use cervical support under neck and a roll around waist as needed. Copyright  VHI. All rights reserved.   Sleeping on Stomach   If this is the only desirable sleeping position, place pillow under lower legs, and under stomach or chest as needed.  Posture - Sitting   Sit upright, head facing forward. Try using a roll to support lower back. Keep shoulders relaxed, and avoid rounded back. Keep hips level with knees. Avoid crossing legs for long periods. Stand to Sit / Sit to Stand   To sit: Bend knees to lower self onto front edge of chair, then scoot back on seat. To stand: Reverse sequence by placing one foot forward, and scoot to front of seat. Use rocking motion to stand up.   Work Height and Reach  Ideal work height is no more than 2 to 4 inches below elbow level when standing, and at elbow level when sitting. Reaching should be limited to arm's length, with elbows slightly bent.  Bending  Bend at hips and knees, not back. Keep feet shoulder-width apart.    Posture - Standing   Good posture is important. Avoid slouching and  forward head thrust. Maintain curve in low back and align ears over shoul- ders, hips over ankles.  Alternating Positions   Alternate tasks and change positions frequently to reduce fatigue and muscle tension. Take rest breaks. Computer Work   Position work to Programmer, multimedia. Use proper work and seat height. Keep shoulders back and down, wrists straight, and elbows at right angles. Use chair that provides full back support. Add footrest and lumbar roll as needed.  Getting Into / Out of Car  Lower self onto seat, scoot back, then bring in one leg at a time. Reverse sequence to get out.  Dressing  Lie on back to pull socks or slacks over feet, or sit and bend leg while keeping back straight.    Housework - Sink  Place one foot on ledge of cabinet under sink when standing at sink for prolonged periods.   Pushing / Pulling  Pushing is preferable to pulling. Keep back in proper alignment, and use leg muscles to do the work.  Deep Squat   Squat and lift with both arms held against upper trunk. Tighten stomach muscles without holding breath. Use smooth movements to avoid jerking.  Avoid Twisting   Avoid twisting or bending back. Pivot around using foot movements, and bend at knees if needed when reaching for articles.  Carrying Luggage   Distribute weight evenly on both sides. Use a cart whenever possible. Do not twist trunk. Move  body as a unit.   Lifting Principles .Maintain proper posture and head alignment. .Slide object as close as possible before lifting. .Move obstacles out of the way. .Test before lifting; ask for help if too heavy. .Tighten stomach muscles without holding breath. .Use smooth movements; do not jerk. .Use legs to do the work, and pivot with feet. .Distribute the work load symmetrically and close to the center of trunk. .Push instead of pull whenever possible.   Ask For Help   Ask for help and delegate to others when possible. Coordinate your  movements when lifting together, and maintain the low back curve.  Log Roll   Lying on back, bend left knee and place left arm across chest. Roll all in one movement to the right. Reverse to roll to the left. Always move as one unit. Housework - Sweeping  Use long-handled equipment to avoid stooping.   Housework - Wiping  Position yourself as close as possible to reach work surface. Avoid straining your back.  Laundry - Unloading Wash   To unload small items at bottom of washer, lift leg opposite to arm being used to reach.  [place] close to area to be raked. Use arm movements to do the work. Keep back straight and avoid twisting.     Cart  When reaching into cart with one arm, lift opposite leg to keep back straight.   Getting Into / Out of Bed  Lower self to lie down on one side by raising legs and lowering head at the same time. Use arms to assist moving without twisting. Bend both knees to roll onto back if desired. To sit up, start from lying on side, and use same move-ments in reverse. Housework - Vacuuming  Hold the vacuum with arm held at side. Step back and forth to move it, keeping head up. Avoid twisting.   Laundry - IT consultant so that bending and twisting can be avoided.   Laundry - Unloading Dryer  Squat down to reach into clothes dryer or use a reacher.  Gardening - Weeding / Probation officer or Kneel. Knee pads may be helpful.
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
## 2017-12-31 ENCOUNTER — Ambulatory Visit (INDEPENDENT_AMBULATORY_CARE_PROVIDER_SITE_OTHER): Payer: Medicare Other | Admitting: Rehabilitative and Restorative Service Providers"

## 2017-12-31 ENCOUNTER — Encounter: Payer: Self-pay | Admitting: Rehabilitative and Restorative Service Providers"

## 2017-12-31 ENCOUNTER — Encounter: Payer: Self-pay | Admitting: Family Medicine

## 2017-12-31 ENCOUNTER — Ambulatory Visit (INDEPENDENT_AMBULATORY_CARE_PROVIDER_SITE_OTHER): Payer: Medicare Other | Admitting: Family Medicine

## 2017-12-31 VITALS — BP 137/79 | HR 73 | Ht 62.0 in | Wt 150.0 lb

## 2017-12-31 DIAGNOSIS — R29898 Other symptoms and signs involving the musculoskeletal system: Secondary | ICD-10-CM

## 2017-12-31 DIAGNOSIS — E871 Hypo-osmolality and hyponatremia: Secondary | ICD-10-CM | POA: Diagnosis not present

## 2017-12-31 DIAGNOSIS — H8101 Meniere's disease, right ear: Secondary | ICD-10-CM

## 2017-12-31 DIAGNOSIS — M545 Low back pain, unspecified: Secondary | ICD-10-CM

## 2017-12-31 DIAGNOSIS — R293 Abnormal posture: Secondary | ICD-10-CM | POA: Diagnosis not present

## 2017-12-31 DIAGNOSIS — S0990XA Unspecified injury of head, initial encounter: Secondary | ICD-10-CM

## 2017-12-31 DIAGNOSIS — D509 Iron deficiency anemia, unspecified: Secondary | ICD-10-CM | POA: Diagnosis not present

## 2017-12-31 MED ORDER — MECLIZINE HCL 25 MG PO TABS: 25.0000 mg | ORAL_TABLET | Freq: Three times a day (TID) | ORAL | 0 refills | Status: DC | PRN

## 2017-12-31 MED ORDER — SULFAMETHOXAZOLE-TRIMETHOPRIM 800-160 MG PO TABS: 2.0000 | ORAL_TABLET | Freq: Two times a day (BID) | ORAL | 0 refills | Status: DC

## 2017-12-31 NOTE — Patient Instructions (Addendum)
  Combination (Hook-Lying)    Tighten core and slowly raise left leg and lower opposite arm over head. Keep trunk rigid. Repeat __10__ times per set. Do __1-2__ sets per session. Do ___1_ sessions per day.    Strengthening: Hip Abductor - Resisted    With band looped around both legs above knees, push thighs apart. Repeat __10__ times per set. Do __1-2__ sets per session. Do __1__ sessions per day.    Strengthening: Hip Abduction - Resisted    With tubing around right leg, other side toward anchor, extend leg out from side. Repeat __10__ times per set. Do _1-2___ sets per session. Do __1__ sessions per day.    Strengthening: Hip Extension - Resisted    Face wall or counter and pull leg straight back. Repeat __10__ times per set. Do __1-2__ sets per session. Do _1___ sessions per day.
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
## 2018-01-04 ENCOUNTER — Encounter: Payer: Self-pay | Admitting: Rehabilitative and Restorative Service Providers"

## 2018-01-04 ENCOUNTER — Ambulatory Visit (INDEPENDENT_AMBULATORY_CARE_PROVIDER_SITE_OTHER): Payer: Medicare Other | Admitting: Rehabilitative and Restorative Service Providers"

## 2018-01-04 DIAGNOSIS — M545 Low back pain, unspecified: Secondary | ICD-10-CM

## 2018-01-04 DIAGNOSIS — R29898 Other symptoms and signs involving the musculoskeletal system: Secondary | ICD-10-CM

## 2018-01-04 NOTE — Patient Instructions (Addendum)
Resisted External Rotation: in Neutral - Bilateral   PALMS UP Sit or stand, tubing in both hands, elbows at sides, bent to 90, forearms forward. Pinch shoulder blades together and rotate forearms out. Keep elbows at sides. Repeat __10__ times per set. Do _2-3___ sets per session. Do _1-2___ sessions per day.   Low Row: Standing   Face anchor, feet shoulder width apart. Palms up, pull arms back, squeezing shoulder blades together. Repeat 10__ times per set. Do 2-3__ sets per session. Do 1_ sessions per week. Anchor Height: Waist     Strengthening: Resisted Extension   Hold tubing in right hand, arm forward. Pull arm back, elbow straight. Repeat _10___ times per set. Do 2-3____ sets per session. Do1__ sessions per day.
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
## 2018-01-06 ENCOUNTER — Ambulatory Visit (INDEPENDENT_AMBULATORY_CARE_PROVIDER_SITE_OTHER): Payer: Medicare Other | Admitting: Rehabilitative and Restorative Service Providers"

## 2018-01-06 ENCOUNTER — Encounter: Payer: Self-pay | Admitting: Rehabilitative and Restorative Service Providers"

## 2018-01-06 DIAGNOSIS — M545 Low back pain, unspecified: Secondary | ICD-10-CM

## 2018-01-06 DIAGNOSIS — R293 Abnormal posture: Secondary | ICD-10-CM

## 2018-01-06 DIAGNOSIS — R29898 Other symptoms and signs involving the musculoskeletal system: Secondary | ICD-10-CM | POA: Diagnosis not present
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
## 2018-01-06 NOTE — Patient Instructions (Signed)

## 2018-01-08 ENCOUNTER — Encounter: Payer: Medicare Other | Admitting: Rehabilitative and Restorative Service Providers"

## 2018-01-08 ENCOUNTER — Other Ambulatory Visit: Payer: Self-pay | Admitting: Family Medicine

## 2018-01-12 DIAGNOSIS — S30810A Abrasion of lower back and pelvis, initial encounter: Secondary | ICD-10-CM | POA: Diagnosis not present

## 2018-01-26 ENCOUNTER — Other Ambulatory Visit: Payer: Self-pay | Admitting: Family Medicine

## 2018-02-01 DIAGNOSIS — G8929 Other chronic pain: Secondary | ICD-10-CM | POA: Diagnosis not present

## 2018-02-01 DIAGNOSIS — M545 Low back pain: Secondary | ICD-10-CM | POA: Diagnosis not present

## 2018-02-04 DIAGNOSIS — E871 Hypo-osmolality and hyponatremia: Secondary | ICD-10-CM | POA: Diagnosis not present

## 2018-02-05 DIAGNOSIS — M47896 Other spondylosis, lumbar region: Secondary | ICD-10-CM | POA: Diagnosis not present

## 2018-02-05 DIAGNOSIS — S065X9A Traumatic subdural hemorrhage with loss of consciousness of unspecified duration, initial encounter: Secondary | ICD-10-CM | POA: Diagnosis not present

## 2018-02-05 DIAGNOSIS — I6200 Nontraumatic subdural hemorrhage, unspecified: Secondary | ICD-10-CM | POA: Diagnosis not present

## 2018-02-05 DIAGNOSIS — G8929 Other chronic pain: Secondary | ICD-10-CM | POA: Diagnosis not present

## 2018-02-05 DIAGNOSIS — M545 Low back pain: Secondary | ICD-10-CM | POA: Diagnosis not present

## 2018-02-05 DIAGNOSIS — M4316 Spondylolisthesis, lumbar region: Secondary | ICD-10-CM | POA: Diagnosis not present

## 2018-02-05 DIAGNOSIS — S3992XA Unspecified injury of lower back, initial encounter: Secondary | ICD-10-CM | POA: Diagnosis not present

## 2018-02-05 LAB — BASIC METABOLIC PANEL WITH GFR
BUN: 16 mg/dL (ref 7–25)
CHLORIDE: 94 mmol/L — AB (ref 98–110)
CO2: 27 mmol/L (ref 20–32)
CREATININE: 0.75 mg/dL (ref 0.50–0.99)
Calcium: 9.2 mg/dL (ref 8.6–10.4)
GFR, EST AFRICAN AMERICAN: 96 mL/min/{1.73_m2} (ref >=60)
GFR, EST NON AFRICAN AMERICAN: 83 mL/min/{1.73_m2} (ref >=60)
Glucose, Bld: 89 mg/dL (ref 65–99)
POTASSIUM: 4.1 mmol/L (ref 3.5–5.3)
SODIUM: 132 mmol/L — AB (ref 135–146)

## 2018-02-08 ENCOUNTER — Other Ambulatory Visit: Payer: Self-pay | Admitting: *Deleted

## 2018-02-08 DIAGNOSIS — E871 Hypo-osmolality and hyponatremia: Secondary | ICD-10-CM

## 2018-02-12 ENCOUNTER — Telehealth: Payer: Self-pay

## 2018-02-12 MED ORDER — LISINOPRIL 10 MG PO TABS: 10.0000 mg | ORAL_TABLET | Freq: Every day | ORAL | 1 refills | Status: DC

## 2018-02-12 NOTE — Telephone Encounter (Signed)
I agree. Lets start lisinopril 10mg  daily in the AM.  New rx sent to [facility]. Please start.

## 2018-02-12 NOTE — Telephone Encounter (Signed)
[name] called and states her blood pressure has been more than 140-90. It has been as high as 162/104. She has an appointment on Monday but is worried about going through the weekend with elevated blood pressure. Please advise.

## 2018-02-12 NOTE — Telephone Encounter (Signed)
Patient advised of new medication  

## 2018-02-15 ENCOUNTER — Encounter: Payer: Self-pay | Admitting: Family Medicine

## 2018-02-15 ENCOUNTER — Ambulatory Visit (INDEPENDENT_AMBULATORY_CARE_PROVIDER_SITE_OTHER): Payer: Medicare Other | Admitting: Family Medicine

## 2018-02-15 VITALS — BP 142/74 | HR 70 | Ht 62.0 in | Wt 150.0 lb

## 2018-02-15 DIAGNOSIS — I10 Essential (primary) hypertension: Secondary | ICD-10-CM | POA: Diagnosis not present

## 2018-02-15 DIAGNOSIS — E871 Hypo-osmolality and hyponatremia: Secondary | ICD-10-CM

## 2018-02-15 NOTE — Progress Notes (Signed)
   Subjective:    Patient ID: [name], female    DOB: 1951-03-30, 67 y.o.   MRN: [id number]  HPI 67 year old female history of asthma and migraines, had sent a my chart note last week as below: "[name] called and states her blood pressure has been more than 140-90. It has been as high as 162/104. She has an appointment on Monday but is worried about going through the weekend with elevated blood pressure. Please advise".  Called in lisinopril over the weekend for her to start.  She was previously on Maxide but because of some hyponatremia we had actually cut her pill in half and stop the medication completely.  Since being off the medication her blood pressure started to elevate again.  She is actually was still been taking a half a tab of the Maxide and a half a tab of lisinopril that she picked up on Friday.  No side effects thus far.  She has been swelling a lot more since coming down on her Maxide.  She has been expensing some headaches especially when her blood pressure was quite elevated.  Review of Systems     Objective:   Physical Exam  Constitutional: She is oriented to person, place, and time. She appears well-developed and well-nourished.  HENT:  Head: Normocephalic and atraumatic.  Cardiovascular: Normal rate, regular rhythm and normal heart sounds.  No carotid bruits  Pulmonary/Chest: Effort normal and breath sounds normal.  Neurological: She is alert and oriented to person, place, and time.  Skin: Skin is warm and dry.  Psychiatric: She has a normal mood and affect. Her behavior is normal.       Assessment & Plan:   HTN -pressure still elevated.  Can have her stop her diuretic completely and go ahead and take a whole tab of the lisinopril.  Splane that we have to have her off the diuretic completely so that we can further work-up the hyponatremia.  Hyponatremia - she has bee off her diuretic for about 10 days.  D  ue to recheck sodium level off the  diuretic.

## 2018-02-17 DIAGNOSIS — M5136 Other intervertebral disc degeneration, lumbar region: Secondary | ICD-10-CM | POA: Diagnosis not present

## 2018-02-17 DIAGNOSIS — M4316 Spondylolisthesis, lumbar region: Secondary | ICD-10-CM | POA: Diagnosis not present

## 2018-02-17 DIAGNOSIS — M533 Sacrococcygeal disorders, not elsewhere classified: Secondary | ICD-10-CM | POA: Diagnosis not present

## 2018-02-17 DIAGNOSIS — M47896 Other spondylosis, lumbar region: Secondary | ICD-10-CM | POA: Diagnosis not present

## 2018-02-17 DIAGNOSIS — M545 Low back pain: Secondary | ICD-10-CM | POA: Diagnosis not present

## 2018-02-17 DIAGNOSIS — G8929 Other chronic pain: Secondary | ICD-10-CM | POA: Diagnosis not present

## 2018-02-19 DIAGNOSIS — E871 Hypo-osmolality and hyponatremia: Secondary | ICD-10-CM | POA: Diagnosis not present

## 2018-02-19 LAB — BASIC METABOLIC PANEL WITH GFR
BUN: 12 mg/dL (ref 7–25)
CO2: 28 mmol/L (ref 20–32)
CREATININE: 0.75 mg/dL (ref 0.50–0.99)
Calcium: 9.1 mg/dL (ref 8.6–10.4)
Chloride: 98 mmol/L (ref 98–110)
GFR, Est African American: 96 mL/min/{1.73_m2} (ref >=60)
GFR, Est Non African American: 82 mL/min/{1.73_m2} (ref >=60)
GLUCOSE: 80 mg/dL (ref 65–99)
Potassium: 4.5 mmol/L (ref 3.5–5.3)
SODIUM: 133 mmol/L — AB (ref 135–146)

## 2018-02-22 ENCOUNTER — Telehealth: Payer: Self-pay

## 2018-02-22 MED ORDER — LOSARTAN POTASSIUM 50 MG PO TABS: 25.0000 mg | ORAL_TABLET | Freq: Every day | ORAL | 0 refills | Status: DC

## 2018-02-22 NOTE — Telephone Encounter (Signed)
Left message advising of recommendations.  

## 2018-02-22 NOTE — Telephone Encounter (Signed)
[name] called and states she is unable to continue with the Lisinopril because of a cough side effect. She has a follow up on Monday. Please advise.

## 2018-02-22 NOTE — Telephone Encounter (Signed)
Okay, let us change her to losartan.  It is a close cousin lisinopril but is less likely to cause a dry cough.  If she is able to start that one this week then we can see how well she is doing on it next Monday.

## 2018-02-28 ENCOUNTER — Other Ambulatory Visit: Payer: Self-pay | Admitting: Family Medicine

## 2018-03-01 DIAGNOSIS — M545 Low back pain: Secondary | ICD-10-CM | POA: Diagnosis not present

## 2018-03-01 DIAGNOSIS — G8929 Other chronic pain: Secondary | ICD-10-CM | POA: Diagnosis not present

## 2018-03-02 ENCOUNTER — Ambulatory Visit (INDEPENDENT_AMBULATORY_CARE_PROVIDER_SITE_OTHER): Payer: Medicare Other | Admitting: Family Medicine

## 2018-03-02 VITALS — BP 139/72 | HR 79 | Temp 98.8°F | Ht 62.0 in | Wt 150.0 lb

## 2018-03-02 DIAGNOSIS — Z23 Encounter for immunization: Secondary | ICD-10-CM | POA: Diagnosis not present

## 2018-03-02 DIAGNOSIS — I10 Essential (primary) hypertension: Secondary | ICD-10-CM

## 2018-03-02 MED ORDER — OLMESARTAN MEDOXOMIL 20 MG PO TABS: 20.0000 mg | ORAL_TABLET | Freq: Every day | ORAL | 0 refills | Status: DC

## 2018-03-02 NOTE — Progress Notes (Signed)
Patient presents to clinic today for a blood pressure check. Blood pressure at last visit was 142/74. Blood pressure today was 139/72. Patient states she is no longer taking the Losartan 50mg  due to it causing her to have headaches. Her last pill was taken last Friday (02/26/2018). Since stopping the Losartan her headaches have gone away. Patient is checking her blood pressure at home. Patient denies chest pain, shortness of breath, dizziness, fatigue, or trouble sleeping. Spoke with provider and patient will be switched to Olmesartan 20mg . Patient advised to make an appointment in 2-3 weeks for another nurse visit to check blood pressure.   Patient also wanted her influenza vaccine while she was here. Patient denies recent fever or illness. Temperature was 98.8 (oral). Flu shot given in the left deltoid with no complications. No further questions or concerns at this time.

## 2018-03-02 NOTE — Progress Notes (Signed)
Agree with documentation as above.   Sylvan Laray Corbit, MD  

## 2018-03-06 ENCOUNTER — Other Ambulatory Visit: Payer: Self-pay | Admitting: Family Medicine

## 2018-03-09 ENCOUNTER — Telehealth: Payer: Self-pay

## 2018-03-09 NOTE — Telephone Encounter (Signed)
[name] is unable to take the Benicar. She wants to wait for Dr Madilyn Fireman to return. Please advise.

## 2018-03-14 MED ORDER — METOPROLOL SUCCINATE ER 25 MG PO TB24: 25.0000 mg | ORAL_TABLET | Freq: Every day | ORAL | 0 refills | Status: DC

## 2018-03-14 NOTE — Telephone Encounter (Signed)
OK, let's change category of BP med completely. New rx sent for metoprolol to [facility].  If tolerating well then f/u in 2 weeks for BP check with nurse.

## 2018-03-15 NOTE — Telephone Encounter (Signed)
Pt advised. She will call to schedule BP NV.

## 2018-03-22 ENCOUNTER — Ambulatory Visit: Payer: Medicare Other

## 2018-04-02 ENCOUNTER — Telehealth: Payer: Self-pay

## 2018-04-02 NOTE — Telephone Encounter (Signed)
[name] called and states the metoprolol is elevating her blood pressure. 160/90. She states she would like to stop taking it. Please advise.

## 2018-04-02 NOTE — Telephone Encounter (Signed)
Pt advised. She will call with BP readings Monday.

## 2018-04-02 NOTE — Telephone Encounter (Signed)
Okay.  Have her stop her blood pressure medication and then track her pressures over the weekend and let me know how high they are running.  We could always consider a calcium channel blocker which would be like amlodipine.

## 2018-04-12 ENCOUNTER — Other Ambulatory Visit: Payer: Self-pay | Admitting: Family Medicine

## 2018-06-09 DIAGNOSIS — Z170 Estrogen receptor positive status [ER+]: Secondary | ICD-10-CM | POA: Diagnosis not present

## 2018-06-09 DIAGNOSIS — C50112 Malignant neoplasm of central portion of left female breast: Secondary | ICD-10-CM | POA: Diagnosis not present

## 2018-06-09 DIAGNOSIS — R928 Other abnormal and inconclusive findings on diagnostic imaging of breast: Secondary | ICD-10-CM | POA: Diagnosis not present

## 2018-06-14 ENCOUNTER — Telehealth: Payer: Self-pay

## 2018-06-14 NOTE — Telephone Encounter (Signed)
Pt called stating that since she has Maniere's disease and she is hard of hearing, so she wants a note to excuse her from [place] duty on March 11th.

## 2018-06-14 NOTE — Telephone Encounter (Signed)
Letter printed and signed by Dr Madilyn Fireman. Pt advised

## 2018-06-14 NOTE — Telephone Encounter (Signed)
OK for jury duty note

## 2018-06-23 ENCOUNTER — Other Ambulatory Visit: Payer: Self-pay | Admitting: Family Medicine

## 2018-06-23 IMAGING — DX DG ANKLE COMPLETE 3+V*R*
3 series · 3 of 3 positions shown · non-contrast
Comparison: None.

CLINICAL DATA: Right ankle pain after injury yesterday

EXAM:
RIGHT ANKLE - COMPLETE 3+ VIEW

[ankle ap]
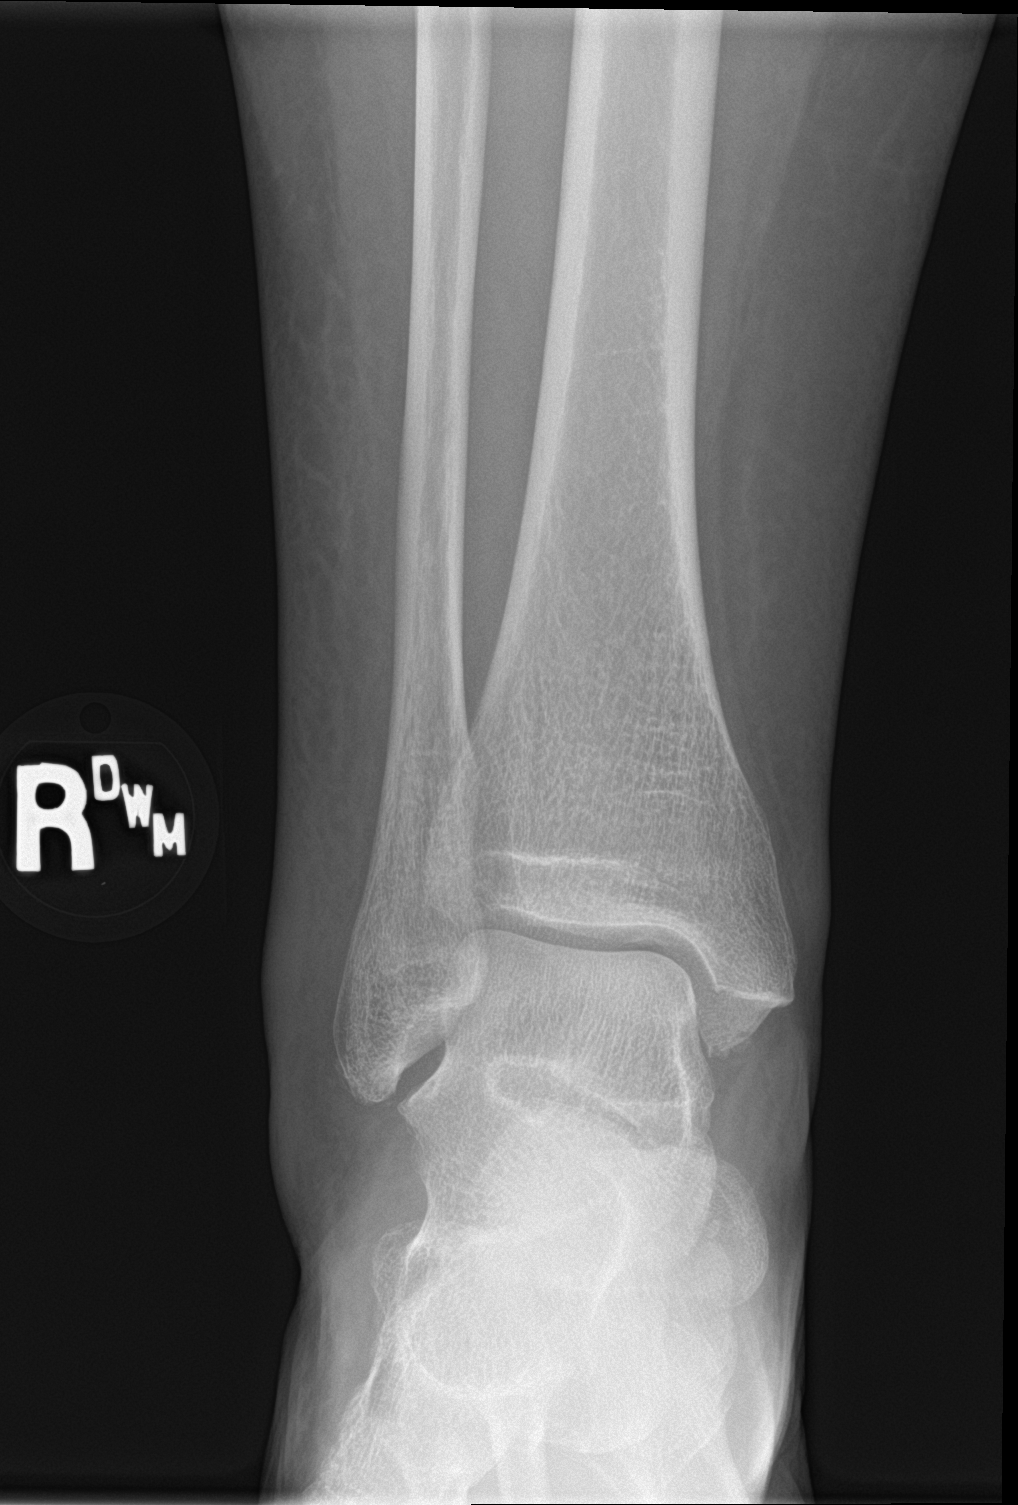

[ankle obl]
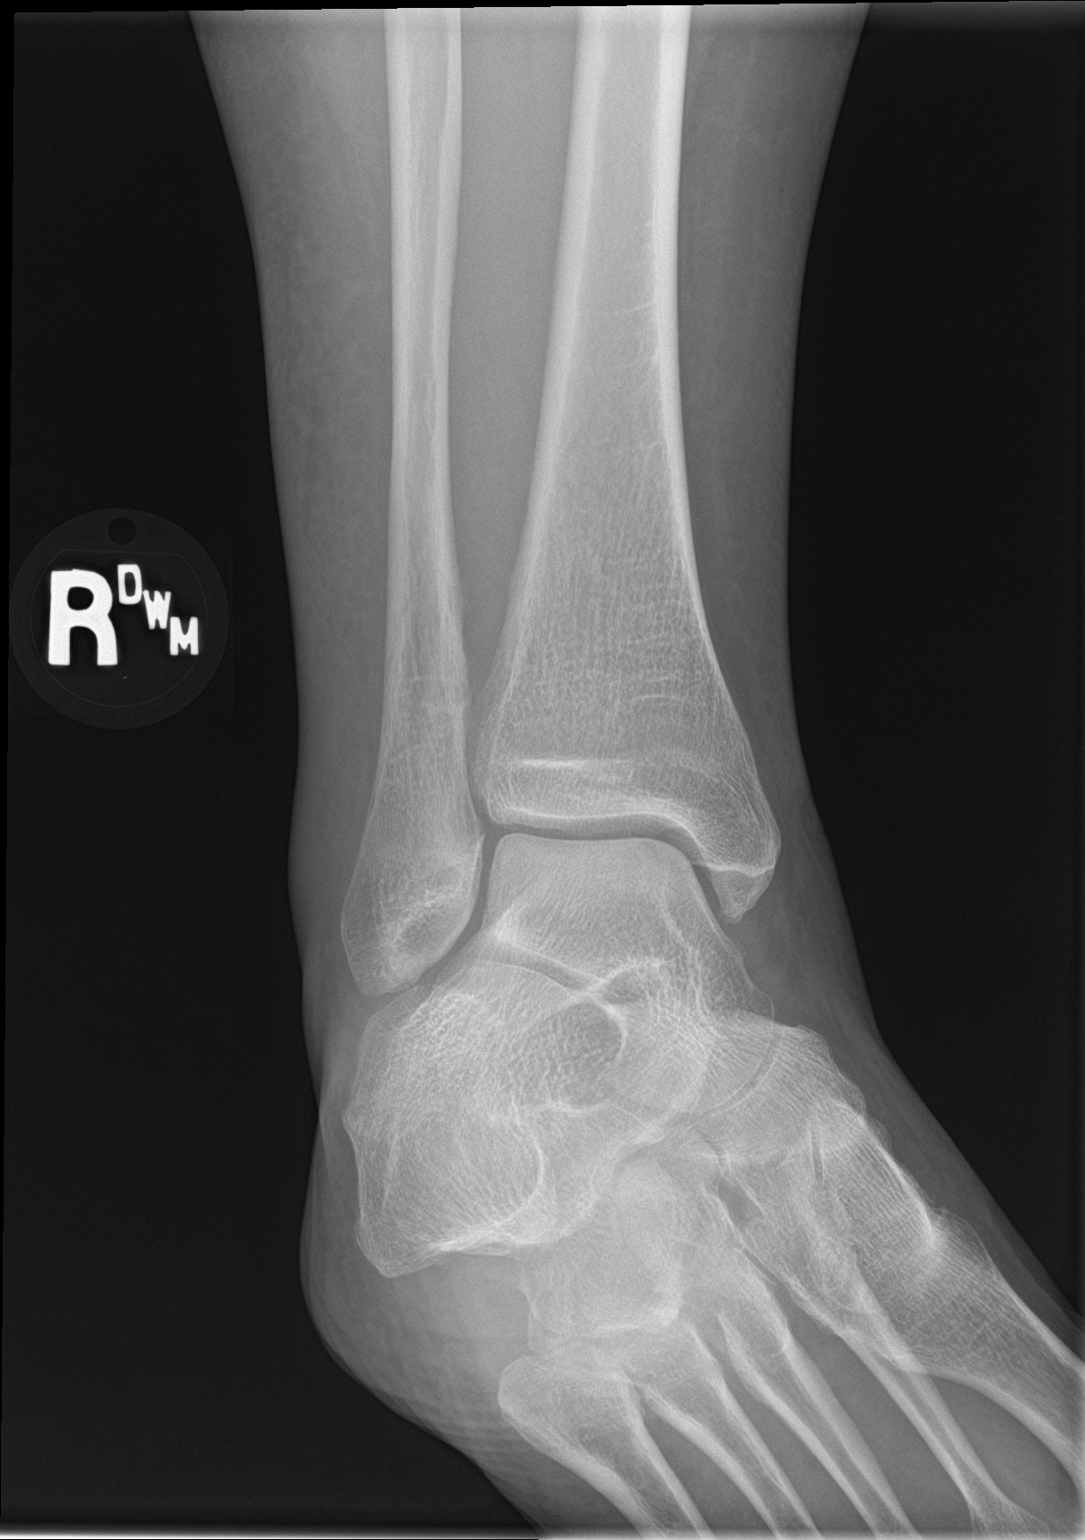

[ankle lat]
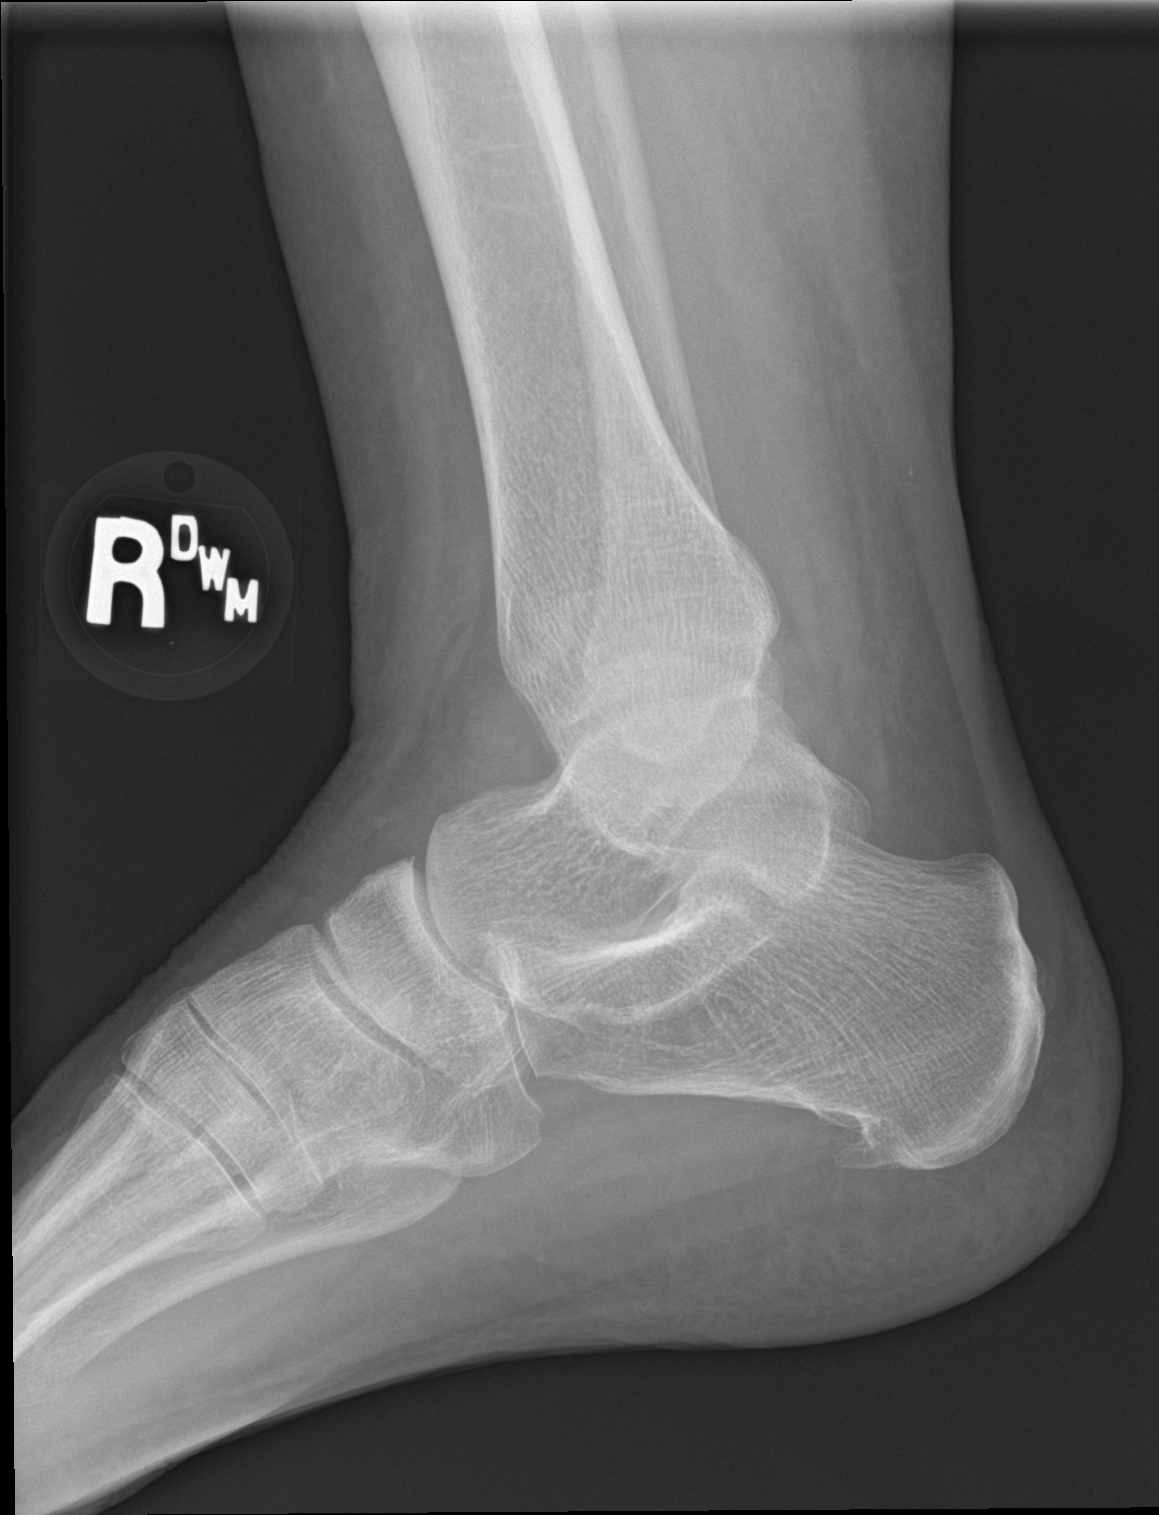

[3 of 3 positions shown; findings below may reference images not displayed]

FINDINGS: Lateral right ankle soft tissue swelling. No fracture, subluxation
or suspicious focal osseous lesion. Small plantar right calcaneal
spur. No radiopaque foreign body.
IMPRESSION: Lateral right ankle soft tissue swelling, with no fracture or
subluxation.

## 2018-06-23 NOTE — Telephone Encounter (Signed)
Please call pt and see what is going on. We stopped this med bc it seemed to be dropping her sodium level.  Did she restart it. I couldn't find any notation about it and I thought she was going to schedule a BP f/U appt.  Not sure what she is taking right now.  We had held her metoprolol in Nov due to side effects. She was supposed to hold it and then would call back with her BPs. We were considering a calcium channel blocker.    04/02/18 "Okay.  Have her stop her blood pressure medication and then track her pressures over the weekend and let me know how high they are running.  We could always consider a calcium channel blocker which would be like amlodipine."

## 2018-06-25 NOTE — Telephone Encounter (Signed)
Per pt, she restarted on her own about a month ago because she said she has to have it for her Meniere disease. States she does not think her sodium is "that low".   Pt states she stopped metoprolol and states her most recent blood pressure reading this week was 133/87. She states she will not add any further medications because she feels she is not able to tolerate them and it is "not worth it".  I have scheduled pt for a blood pressure follow up with Dr Madilyn Fireman on 07-05-18 at 3:00. I advised pt to record blood pressures from now until appt and bring them in with her. She is agreeable on plan and states that she has enough medication to last until her appt and does not need a refill at this time.

## 2018-06-26 ENCOUNTER — Other Ambulatory Visit: Payer: Self-pay | Admitting: Family Medicine

## 2018-06-28 NOTE — Telephone Encounter (Signed)
Must make appointment 

## 2018-07-05 ENCOUNTER — Ambulatory Visit (INDEPENDENT_AMBULATORY_CARE_PROVIDER_SITE_OTHER): Payer: Medicare Other | Admitting: Family Medicine

## 2018-07-05 ENCOUNTER — Encounter: Payer: Self-pay | Admitting: Family Medicine

## 2018-07-05 VITALS — BP 140/87 | HR 77 | Ht 62.0 in | Wt 148.0 lb

## 2018-07-05 DIAGNOSIS — M7061 Trochanteric bursitis, right hip: Secondary | ICD-10-CM

## 2018-07-05 DIAGNOSIS — I10 Essential (primary) hypertension: Secondary | ICD-10-CM | POA: Diagnosis not present

## 2018-07-05 DIAGNOSIS — M949 Disorder of cartilage, unspecified: Secondary | ICD-10-CM

## 2018-07-05 DIAGNOSIS — M25551 Pain in right hip: Secondary | ICD-10-CM | POA: Diagnosis not present

## 2018-07-05 DIAGNOSIS — M899 Disorder of bone, unspecified: Secondary | ICD-10-CM | POA: Diagnosis not present

## 2018-07-05 MED ORDER — AMLODIPINE BESYLATE 5 MG PO TABS: 5.0000 mg | ORAL_TABLET | Freq: Every day | ORAL | 3 refills | Status: DC

## 2018-07-05 NOTE — Progress Notes (Signed)
Subjective:    CC: HTN  HPI:  Hypertension- Pt denies chest pain, SOB, dizziness, or heart palpitations.  Taking meds as directed w/o problems.  Denies medication side effects.  Just on  Maxzide.  She felt the metoprolol was increasing her BP.    Also c/o of right hip pain x 6 months. . Painful to sleep on that side. No known injury or trauma.  He notices it is painful to go up and down steps and has to step with her other foot to lead.  Bothering her when she goes to the gym as well.  Not really taking any medication for it.  BP 140/87   Pulse 77   Ht 5\' 2"  (1.575 m)   Wt 148 lb (67.1 kg)   SpO2 97%   BMI 27.07 kg/m     Allergies  Allergen Reactions  . Codeine     REACTION: hyper  . Lisinopril Cough  . Losartan Other (See Comments)    Headaches  . Olmesartan Diarrhea    Past Medical History:  Diagnosis Date  . Asthma   . Breast cancer ([place])   . HSV (herpes simplex virus) anogenital infection   . Hyperlipidemia   . Hypertension   . Meniere's disease   . Migraine with visual aura    optical  . Postmenopausal   . Volvulus ([place])     Past Surgical History:  Procedure Laterality Date  . COLON SURGERY  2003   resection/small bowel ischemia  . ESOPHAGEAL DILATION    . HERNIA REPAIR    . LTCS    . OVARIAN CYST REMOVAL    . SMALL INTESTINE SURGERY  02/2010  . TYMPANOSTOMY TUBE PLACEMENT     placement RT, out now.     Social History   Socioeconomic History  . Marital status: Married    Spouse name: Not on file  . Number of children: Not on file  . Years of education: Not on file  . Highest education level: Not on file  Occupational History  . Occupation: Environmental health practitioner    Comment: [employer]  . Financial resource strain: Not on file  . Food insecurity:    Worry: Not on file    Inability: Not on file  . Transportation needs:    Medical: Not on file    Non-medical: Not on file  Tobacco Use  . Smoking status: Never Smoker  . Smokeless tobacco:  Never Used  Substance and Sexual Activity  . Alcohol use: Yes    Alcohol/week: 3.0 standard drinks    Types: 3 Glasses of wine per week    Comment: per week  . Drug use: No  . Sexual activity: Yes    Partners: Male  Lifestyle  . Physical activity:    Days per week: Not on file    Minutes per session: Not on file  . Stress: Not on file  Relationships  . Social connections:    Talks on phone: Not on file    Gets together: Not on file    Attends religious service: Not on file    Active member of club or organization: Not on file    Attends meetings of clubs or organizations: Not on file    Relationship status: Not on file  . Intimate partner violence:    Fear of current or ex partner: Not on file    Emotionally abused: Not on file    Physically abused: Not on file  Forced sexual activity: Not on file  Other Topics Concern  . Not on file  Social History Narrative   Working out twice per week.      Family History  Problem Relation Age of Onset  . Hypertension Mother   . Hypertension Father   . Depression Father   . Glaucoma Father   . Parkinsonism Father   . Depression Sister   . Depression Sister     Outpatient Encounter Medications as of 07/05/2018  Medication Sig  . albuterol (PROVENTIL HFA;VENTOLIN HFA) 108 (90 Base) MCG/ACT inhaler INHALE 2 PUFFS INTO THE LUNGS EVERY 6 (SIX) HOURS AS NEEDED.  Marland Kitchen Cholecalciferol 1000 units CHEW Chew by mouth.  . citalopram (CELEXA) 20 MG tablet Take 1 tablet (20 mg total) by mouth daily. MUST MAKE APPOINTMENT  . meclizine (ANTIVERT) 25 MG tablet Take 1 tablet (25 mg total) by mouth 3 (three) times daily as needed for dizziness.  . montelukast (SINGULAIR) 10 MG tablet TAKE 1 TABLET (10 MG TOTAL) BY MOUTH AT BEDTIME.  . SUMAtriptan (IMITREX) 100 MG tablet Take 1 tablet (100 mg total) by mouth every 2 (two) hours as needed.  . triamterene-hydrochlorothiazide (MAXZIDE) 75-50 MG tablet TAKE 1 TABLET BY MOUTH EVERY DAY  . valACYclovir  (VALTREX) 1000 MG tablet TAKE 1 TABLET (1,000 MG TOTAL) BY MOUTH 2 (TWO) TIMES DAILY. (1/2) TAB TWICE DAILY.  Marland Kitchen amLODipine (NORVASC) 5 MG tablet Take 1 tablet (5 mg total) by mouth daily.  . [DISCONTINUED] metoprolol succinate (TOPROL-XL) 25 MG 24 hr tablet TAKE 1 TABLET BY MOUTH EVERY DAY   No facility-administered encounter medications on file as of 07/05/2018.         Objective:    General: Well Developed, well nourished, and in no acute distress.  Neuro: Alert and oriented x3, extra-ocular muscles intact, sensation grossly intact.  HEENT: Normocephalic, atraumatic  Skin: Warm and dry, no rashes. Cardiac: Regular rate and rhythm, no murmurs rubs or gallops, no lower extremity edema.  Respiratory: Clear to auscultation bilaterally. Not using accessory muscles, speaking in full sentences. MSK: Bilateral hips with strength 5 out of 5 with flexion and extension.  She is weak with abduction.  Patellar reflexes 1+ bilaterally.  Some discomfort on the outer hip with internal rotation of the hip but no significant discomfort with external rotation.   Impression and Recommendations:    HTN - Borderline.  Added amlodipine 5mg  daily. REcheck Nurse BP check in about 2 weeks.  F/U in 6 months.  Is due for up-to-date lab work.  Right hip pain - Likely trochanteric bursitis based on exam and history today Given exercises/Home PT. we discussed formal PT as an option as well in addition to icing and anti-inflammatory as needed.  If not improving then please let me know.  Osteopenia -is due for a vitamin D test to evaluate for deficiency.

## 2018-07-12 ENCOUNTER — Encounter: Payer: Self-pay | Admitting: Family Medicine

## 2018-07-16 DIAGNOSIS — M949 Disorder of cartilage, unspecified: Secondary | ICD-10-CM | POA: Diagnosis not present

## 2018-07-16 DIAGNOSIS — I10 Essential (primary) hypertension: Secondary | ICD-10-CM | POA: Diagnosis not present

## 2018-07-16 DIAGNOSIS — M899 Disorder of bone, unspecified: Secondary | ICD-10-CM | POA: Diagnosis not present

## 2018-07-17 LAB — COMPLETE METABOLIC PANEL WITH GFR
AG Ratio: 1.9 (calc) (ref 1.0–2.5)
ALT: 15 U/L (ref 6–29)
AST: 17 U/L (ref 10–35)
Albumin: 4.1 g/dL (ref 3.6–5.1)
Alkaline phosphatase (APISO): 103 U/L (ref 37–153)
BUN: 9 mg/dL (ref 7–25)
CO2: 31 mmol/L (ref 20–32)
Calcium: 9.2 mg/dL (ref 8.6–10.4)
Chloride: 97 mmol/L — ABNORMAL LOW (ref 98–110)
Creat: 0.62 mg/dL (ref 0.50–0.99)
GFR, Est African American: 108 mL/min/{1.73_m2} (ref >=60)
GFR, Est Non African American: 93 mL/min/{1.73_m2} (ref >=60)
Globulin: 2.2 g/dL (calc) (ref 1.9–3.7)
Glucose, Bld: 92 mg/dL (ref 65–99)
Potassium: 4.4 mmol/L (ref 3.5–5.3)
Sodium: 135 mmol/L (ref 135–146)
Total Bilirubin: 0.4 mg/dL (ref 0.2–1.2)
Total Protein: 6.3 g/dL (ref 6.1–8.1)

## 2018-07-17 LAB — LIPID PANEL
CHOLESTEROL: 264 mg/dL — AB (ref <200)
HDL: 109 mg/dL (ref >=50)
LDL CHOLESTEROL (CALC): 139 mg/dL — AB
Non-HDL Cholesterol (Calc): 155 mg/dL (calc) — ABNORMAL HIGH (ref <130)
Total CHOL/HDL Ratio: 2.4 (calc) (ref <5.0)
Triglycerides: 68 mg/dL (ref <150)

## 2018-07-17 LAB — TSH: TSH: 2.12 mIU/L (ref 0.40–4.50)

## 2018-07-17 LAB — VITAMIN D 25 HYDROXY (VIT D DEFICIENCY, FRACTURES): Vit D, 25-Hydroxy: 27 ng/mL — ABNORMAL LOW (ref 30–100)

## 2018-07-19 ENCOUNTER — Ambulatory Visit: Payer: Medicare Other

## 2018-08-02 ENCOUNTER — Telehealth: Payer: Self-pay | Admitting: Family Medicine

## 2018-08-02 NOTE — Telephone Encounter (Signed)
Pt was scheduled for NV tomorrow for BP check. With the virus going around, she would prefer not to come into office. Home BP readings are as follows:  128/82, pulse 76 130/87, pulse 69 131/84, pulse 71 118/74, pulse 67 124/81, pulse 71 135/85, pulse 74  She reports taking 2.5mg  amlodipine and 75/50mg  maxzide daily. Denies any chest pain or palpitations. No refills needed at this time. No other concerns. Will route to PCP for review.   NV OV for tomorrow cancelled.

## 2018-08-02 NOTE — Telephone Encounter (Signed)
Call pt: Blood Pressures look great and well controlled.  We can push follow up out a couple of months.  please remind her to get an updated pneumonia vaccine with Pneumovax 23.

## 2018-08-02 NOTE — Telephone Encounter (Signed)
Pt advised. Verbalized understanding.

## 2018-08-03 ENCOUNTER — Ambulatory Visit: Payer: Medicare Other

## 2018-08-16 ENCOUNTER — Telehealth: Payer: Self-pay

## 2018-08-16 NOTE — Telephone Encounter (Signed)
Left message advising of recommendations.  

## 2018-08-16 NOTE — Telephone Encounter (Signed)
[name] called and left a message asking which allergy medication should she take with her current HTN medication. Zyrtec or Claritin. Please advise.

## 2018-08-16 NOTE — Telephone Encounter (Signed)
Either one is OK

## 2018-08-19 DIAGNOSIS — S0990XA Unspecified injury of head, initial encounter: Secondary | ICD-10-CM | POA: Diagnosis not present

## 2018-08-19 DIAGNOSIS — D72810 Lymphocytopenia: Secondary | ICD-10-CM | POA: Diagnosis not present

## 2018-08-19 DIAGNOSIS — Z170 Estrogen receptor positive status [ER+]: Secondary | ICD-10-CM | POA: Diagnosis not present

## 2018-08-19 DIAGNOSIS — H8103 Meniere's disease, bilateral: Secondary | ICD-10-CM | POA: Diagnosis not present

## 2018-08-19 DIAGNOSIS — C50112 Malignant neoplasm of central portion of left female breast: Secondary | ICD-10-CM | POA: Diagnosis not present

## 2018-08-19 DIAGNOSIS — F330 Major depressive disorder, recurrent, mild: Secondary | ICD-10-CM | POA: Diagnosis not present

## 2018-08-19 DIAGNOSIS — Z9189 Other specified personal risk factors, not elsewhere classified: Secondary | ICD-10-CM | POA: Diagnosis not present

## 2018-09-29 ENCOUNTER — Ambulatory Visit (INDEPENDENT_AMBULATORY_CARE_PROVIDER_SITE_OTHER): Payer: Medicare Other | Admitting: Family Medicine

## 2018-09-29 ENCOUNTER — Encounter: Payer: Self-pay | Admitting: Family Medicine

## 2018-09-29 VITALS — BP 119/61 | HR 76 | Temp 98.5°F | Ht 62.0 in | Wt 149.0 lb

## 2018-09-29 DIAGNOSIS — N814 Uterovaginal prolapse, unspecified: Secondary | ICD-10-CM | POA: Diagnosis not present

## 2018-09-29 DIAGNOSIS — N8111 Cystocele, midline: Secondary | ICD-10-CM | POA: Diagnosis not present

## 2018-09-29 DIAGNOSIS — M25572 Pain in left ankle and joints of left foot: Secondary | ICD-10-CM | POA: Diagnosis not present

## 2018-09-29 MED ORDER — CITALOPRAM HYDROBROMIDE 20 MG PO TABS: 20.0000 mg | ORAL_TABLET | Freq: Every day | ORAL | 1 refills | Status: DC

## 2018-09-29 NOTE — Progress Notes (Signed)
Acute Office Visit  Subjective:    Patient ID: [name], female    DOB: [birth year], 67 y.o.   MRN: [id number]  Chief Complaint  Patient presents with  . Abdominal Pain    sxs began 3 wks ago she reports that it feels like something is falling out she is unable to have sex she noticed "pink tissue"she can feel it in the shower some days it is worse and when she walks she can tell something is there.   . Ankle Pain    L ankle hurts when she puts pressure on it     HPI Patient is in today for pelvic discomfort and noticing a pinkish bulge.  She said she was noticing some discomfort about 3 weeks ago.  She says the most would feel like something was falling out of her vaginal area.  She says it is been uncomfortable enough that she has not been able to have sex.  She said she finally just took a look and saw some pink tissue which she says she can also feel in the shower.  She can sometimes notice it when she walks as well.  She previously had C-sections and still has her uterus.  Pap smear is up-to-date.  He also complains of left medial ankle pain she is noticed a little prominent bone there below the medial malleolus and says it can be tender at times.  She wonders if her arches could be collapsing.  Past Medical History:  Diagnosis Date  . Asthma   . Breast cancer ([place])   . HSV (herpes simplex virus) anogenital infection   . Hyperlipidemia   . Hypertension   . Meniere's disease   . Migraine with visual aura    optical  . Postmenopausal   . Volvulus ([place])     Past Surgical History:  Procedure Laterality Date  . COLON SURGERY  2003   resection/small bowel ischemia  . ESOPHAGEAL DILATION    . HERNIA REPAIR    . LTCS    . OVARIAN CYST REMOVAL    . SMALL INTESTINE SURGERY  02/2010  . TYMPANOSTOMY TUBE PLACEMENT     placement RT, out now.     Family History  Problem Relation Age of Onset  . Hypertension Mother   . Hypertension Father   . Depression Father    . Glaucoma Father   . Parkinsonism Father   . Depression Sister   . Depression Sister     Social History   Socioeconomic History  . Marital status: Married    Spouse name: Not on file  . Number of children: Not on file  . Years of education: Not on file  . Highest education level: Not on file  Occupational History  . Occupation: Environmental health practitioner    Comment: [employer]  . Financial resource strain: Not on file  . Food insecurity:    Worry: Not on file    Inability: Not on file  . Transportation needs:    Medical: Not on file    Non-medical: Not on file  Tobacco Use  . Smoking status: Never Smoker  . Smokeless tobacco: Never Used  Substance and Sexual Activity  . Alcohol use: Yes    Alcohol/week: 3.0 standard drinks    Types: 3 Glasses of wine per week    Comment: per week  . Drug use: No  . Sexual activity: Yes    Partners: Male  Lifestyle  . Physical activity:  Days per week: Not on file    Minutes per session: Not on file  . Stress: Not on file  Relationships  . Social connections:    Talks on phone: Not on file    Gets together: Not on file    Attends religious service: Not on file    Active member of club or organization: Not on file    Attends meetings of clubs or organizations: Not on file    Relationship status: Not on file  . Intimate partner violence:    Fear of current or ex partner: Not on file    Emotionally abused: Not on file    Physically abused: Not on file    Forced sexual activity: Not on file  Other Topics Concern  . Not on file  Social History Narrative   Working out twice per week.      Outpatient Medications Prior to Visit  Medication Sig Dispense Refill  . albuterol (PROVENTIL HFA;VENTOLIN HFA) 108 (90 Base) MCG/ACT inhaler INHALE 2 PUFFS INTO THE LUNGS EVERY 6 (SIX) HOURS AS NEEDED. 18 Inhaler 1  . amLODipine (NORVASC) 5 MG tablet Take 1 tablet (5 mg total) by mouth daily. (Patient taking differently: Take 5 mg by mouth  daily. 1/2 tab daily) 30 tablet 3  . Cholecalciferol 1000 units CHEW Chew by mouth.    . montelukast (SINGULAIR) 10 MG tablet TAKE 1 TABLET (10 MG TOTAL) BY MOUTH AT BEDTIME. 90 tablet 2  . SUMAtriptan (IMITREX) 100 MG tablet Take 1 tablet (100 mg total) by mouth every 2 (two) hours as needed. 10 tablet 1  . triamterene-hydrochlorothiazide (MAXZIDE) 75-50 MG tablet TAKE 1 TABLET BY MOUTH EVERY DAY 90 tablet 1  . citalopram (CELEXA) 20 MG tablet Take 1 tablet (20 mg total) by mouth daily. MUST MAKE APPOINTMENT 90 tablet 0  . meclizine (ANTIVERT) 25 MG tablet Take 1 tablet (25 mg total) by mouth 3 (three) times daily as needed for dizziness. 20 tablet 0  . valACYclovir (VALTREX) 1000 MG tablet TAKE 1 TABLET (1,000 MG TOTAL) BY MOUTH 2 (TWO) TIMES DAILY. (1/2) TAB TWICE DAILY. 180 tablet 0   No facility-administered medications prior to visit.     Allergies  Allergen Reactions  . Codeine     REACTION: hyper  . Lisinopril Cough    Other reaction(s): Cough  . Losartan Other (See Comments)    Headaches  . Olmesartan Diarrhea    ROS     Objective:    Physical Exam  Constitutional: She is oriented to person, place, and time. She appears well-developed and well-nourished.  HENT:  Head: Normocephalic and atraumatic.  Eyes: Conjunctivae and EOM are normal.  Cardiovascular: Normal rate.  Pulmonary/Chest: Effort normal.  Genitourinary: There is no rash on the right labia. There is no rash or tenderness on the left labia.    Genitourinary Comments: On exam she has a cystocele.  She has a very small rectocele and she has some uterine prolapse.  The cervix was about halfway down the vaginal canal but was not protruding.  Being that the exam was done with the patient prone I suspect that mostly what she is feeling is the cystocele.  No erythema or irritation.  The cervix appears to be normal on exam.   Musculoskeletal:     Comments: Boney protrusion below medial malleolus on the left ankle.  Mildly tender. It feels boney and consistant with arthritis. She has fairly flat fee.   Neurological: She is alert and oriented  to person, place, and time.  Skin: Skin is dry. No pallor.  Psychiatric: She has a normal mood and affect. Her behavior is normal.  Vitals reviewed.   BP 119/61   Pulse 76   Temp 98.5 F (36.9 C)   Ht 5\' 2"  (1.575 m)   Wt 149 lb (67.6 kg)   SpO2 99%   BMI 27.25 kg/m  Wt Readings from Last 3 Encounters:  09/29/18 149 lb (67.6 kg)  07/05/18 148 lb (67.1 kg)  03/02/18 150 lb (68 kg)    Health Maintenance Due  Topic Date Due  . Hepatitis C Screening  23-Oct-1950  . PNA vac Low Risk Adult (2 of 2 - PPSV23) 06/16/2018    There are no preventive care reminders to display for this patient.   Lab Results  Component Value Date   TSH 2.12 07/16/2018   Lab Results  Component Value Date   WBC 6.5 12/21/2017   HGB 10.7 (L) 12/21/2017   HCT 32.9 (L) 12/21/2017   MCV 79.7 (L) 12/21/2017   PLT 476 (H) 12/21/2017   Lab Results  Component Value Date   NA 135 07/16/2018   K 4.4 07/16/2018   CO2 31 07/16/2018   GLUCOSE 92 07/16/2018   BUN 9 07/16/2018   CREATININE 0.62 07/16/2018   BILITOT 0.4 07/16/2018   ALKPHOS 105 01/12/2014   AST 17 07/16/2018   ALT 15 07/16/2018   PROT 6.3 07/16/2018   ALBUMIN 4.0 01/12/2014   CALCIUM 9.2 07/16/2018   Lab Results  Component Value Date   CHOL 264 (H) 07/16/2018   Lab Results  Component Value Date   HDL 109 07/16/2018   Lab Results  Component Value Date   LDLCALC 139 (H) 07/16/2018   Lab Results  Component Value Date   TRIG 68 07/16/2018   Lab Results  Component Value Date   CHOLHDL 2.4 07/16/2018   No results found for: HGBA1C     Assessment & Plan:   Problem List Items Addressed This Visit    None    Visit Diagnoses    Midline cystocele    -  Primary   Relevant Orders   Ambulatory referral to Urology   Acute left ankle pain       Uterine prolapse         Left medial ankle pain -  likely OA. Recommend trail of scaphoid pads if not helping then she will call back and will refer for custom orthotics.   Cystocele with some uterine prolapse as well. Will refer to Urology for furhter evaluation and to discuss surgical correction options.  OK to push the area back in if needed. Sometimes laying down can help as well.   Meds ordered this encounter  Medications  . citalopram (CELEXA) 20 MG tablet    Sig: Take 1 tablet (20 mg total) by mouth daily.    Dispense:  90 tablet    Refill:  1     Beatrice Lecher, MD

## 2018-09-29 NOTE — Patient Instructions (Signed)
Can try the arch pads from [place].  It is helpful you can go online in order extra pads to putting her shoes.  If not call me in a few weeks and we will place a referral to orthopedics for possible custom inserts.

## 2018-09-30 ENCOUNTER — Encounter: Payer: Self-pay | Admitting: Family Medicine

## 2018-10-13 NOTE — Telephone Encounter (Signed)
Resent referral to [facility]

## 2018-10-14 NOTE — Telephone Encounter (Signed)
I sent referral to physician patient was requesting. - CF

## 2018-10-14 NOTE — Telephone Encounter (Signed)
I have routed this to [facility]- Referral Coordinator who's in-office today and made her aware.

## 2018-10-20 ENCOUNTER — Telehealth: Payer: Self-pay

## 2018-10-20 NOTE — Telephone Encounter (Signed)
Okay, it is highly unusual especially with a combination of taking a diuretic.  If she is also having shortness of breath then I think we need to rule out other potential causes.  Amlodipine can cause some lower extremity swelling but does not usually cause shortness of breath.  She be able to come in tomorrow for an office visit so that we can check her oxygen pulse ox etc. and probably do a work-up such as chest x-ray etc.  She can definitely hold her amlodipine for now.

## 2018-10-20 NOTE — Telephone Encounter (Signed)
[name] called and states the Amlodipine is causing her to have some swelling in her legs and feet. Also some slight shortness of breath. She states she only take 1/2 tablet daily. Please advise.

## 2018-10-20 NOTE — Telephone Encounter (Signed)
Left message with recommendations.  

## 2018-10-21 ENCOUNTER — Ambulatory Visit (INDEPENDENT_AMBULATORY_CARE_PROVIDER_SITE_OTHER): Payer: Medicare Other | Admitting: Family Medicine

## 2018-10-21 ENCOUNTER — Other Ambulatory Visit: Payer: Self-pay | Admitting: Family Medicine

## 2018-10-21 ENCOUNTER — Encounter: Payer: Self-pay | Admitting: Family Medicine

## 2018-10-21 VITALS — BP 129/59 | HR 70 | Ht 62.0 in | Wt 149.0 lb

## 2018-10-21 DIAGNOSIS — N8111 Cystocele, midline: Secondary | ICD-10-CM | POA: Diagnosis not present

## 2018-10-21 DIAGNOSIS — I10 Essential (primary) hypertension: Secondary | ICD-10-CM | POA: Diagnosis not present

## 2018-10-21 DIAGNOSIS — J4540 Moderate persistent asthma, uncomplicated: Secondary | ICD-10-CM

## 2018-10-21 DIAGNOSIS — E611 Iron deficiency: Secondary | ICD-10-CM | POA: Diagnosis not present

## 2018-10-21 DIAGNOSIS — N816 Rectocele: Secondary | ICD-10-CM | POA: Diagnosis not present

## 2018-10-21 DIAGNOSIS — M7989 Other specified soft tissue disorders: Secondary | ICD-10-CM | POA: Diagnosis not present

## 2018-10-21 DIAGNOSIS — R0602 Shortness of breath: Secondary | ICD-10-CM

## 2018-10-21 MED ORDER — BECLOMETHASONE DIPROPIONATE 40 MCG/ACT IN AERS: 2.0000 | INHALATION_SPRAY | Freq: Two times a day (BID) | RESPIRATORY_TRACT | 12 refills | Status: DC

## 2018-10-21 MED ORDER — AMLODIPINE BESYLATE 2.5 MG PO TABS: 1.2500 mg | ORAL_TABLET | Freq: Every day | ORAL | 1 refills | Status: DC

## 2018-10-21 NOTE — Progress Notes (Signed)
She reports that she has had some swelling in her legs and they have been achy.  She has felt some SOB when she gets up and moves around and has had to use her inhaler more lately. Denies CP however she has had some fluttering but feels that was associated with her hiatal hernia and this happens anytime w/exhertion.   She becomes a little light headed going from sitting to standing and walking up hills.  Maryruth Eve, Lahoma Crocker, CMA

## 2018-10-21 NOTE — Patient Instructions (Signed)
Call or send a My chart in 2 weeks to let us know how you are doing with the swelling and BP.

## 2018-10-21 NOTE — Progress Notes (Signed)
Established Patient Office Visit  Subjective:  Patient ID: [name], female    DOB: [birth year]  Age: 68 y.o. MRN: [id number]  CC:  Chief Complaint  Patient presents with  . Hypertension  . Edema    [name] presents for leg swelling.  She had actually sent [place] a MyChart note yesterday complaining that amlodipine was causing her to have some swelling in her legs and feet.  She not had any problems with this previously.  Plus she was only taking a half of a tablet.  She also felt she was having a little bit of shortness of breath.  Actually started the amlodipine back in February and says ever since then she has been getting some swelling in her ankles.  Always looks a little better in the morning and then gets worse throughout the day.  More recently it was getting more bothersome to the point that her calves felt extremely tight and hard.  It would tend to get better the next day.  She says she otherwise really loves the amlodipine because is 1 of the few things that she is tolerated well and actually does an amazing job in controlling her blood pressure.  She says in fact her systolic pressures are usually between 110 and 120 at home.  She is also noticed a little bit more shortness of breath.  But she admits she probably needs to lose about 30 pounds but she is trying to be active but just noticed that she is a little bit more breathless at times.  She says a couple weeks ago she was actually using her albuterol as many as 4 times a day or is this week she is mostly been using it once.  She does have a history of asthma.  Hypertension- Pt denies chest pain, SOB, dizziness, or heart palpitations.  Taking meds as directed w/o problems.  Denies medication side effects.      Past Medical History:  Diagnosis Date  . Asthma   . Breast cancer ([place])   . HSV (herpes simplex virus) anogenital infection   . Hyperlipidemia   . Hypertension   . Meniere's disease   .  Migraine with visual aura    optical  . Postmenopausal   . Volvulus ([place])     Past Surgical History:  Procedure Laterality Date  . COLON SURGERY  2003   resection/small bowel ischemia  . ESOPHAGEAL DILATION    . HERNIA REPAIR    . LTCS    . OVARIAN CYST REMOVAL    . SMALL INTESTINE SURGERY  02/2010  . TYMPANOSTOMY TUBE PLACEMENT     placement RT, out now.     Family History  Problem Relation Age of Onset  . Hypertension Mother   . Hypertension Father   . Depression Father   . Glaucoma Father   . Parkinsonism Father   . Depression Sister   . Depression Sister     Social History   Socioeconomic History  . Marital status: Married    Spouse name: Not on file  . Number of children: Not on file  . Years of education: Not on file  . Highest education level: Not on file  Occupational History  . Occupation: Environmental health practitioner    Comment: [employer]  . Financial resource strain: Not on file  . Food insecurity:    Worry: Not on file    Inability: Not on file  . Transportation needs:  Medical: Not on file    Non-medical: Not on file  Tobacco Use  . Smoking status: Never Smoker  . Smokeless tobacco: Never Used  Substance and Sexual Activity  . Alcohol use: Yes    Alcohol/week: 3.0 standard drinks    Types: 3 Glasses of wine per week    Comment: per week  . Drug use: No  . Sexual activity: Yes    Partners: Male  Lifestyle  . Physical activity:    Days per week: Not on file    Minutes per session: Not on file  . Stress: Not on file  Relationships  . Social connections:    Talks on phone: Not on file    Gets together: Not on file    Attends religious service: Not on file    Active member of club or organization: Not on file    Attends meetings of clubs or organizations: Not on file    Relationship status: Not on file  . Intimate partner violence:    Fear of current or ex partner: Not on file    Emotionally abused: Not on file    Physically abused:  Not on file    Forced sexual activity: Not on file  Other Topics Concern  . Not on file  Social History Narrative   Working out twice per week.      Outpatient Medications Prior to Visit  Medication Sig Dispense Refill  . albuterol (PROVENTIL HFA;VENTOLIN HFA) 108 (90 Base) MCG/ACT inhaler INHALE 2 PUFFS INTO THE LUNGS EVERY 6 (SIX) HOURS AS NEEDED. 18 Inhaler 1  . Cholecalciferol 1000 units CHEW Chew by mouth.    . citalopram (CELEXA) 20 MG tablet Take 1 tablet (20 mg total) by mouth daily. 90 tablet 1  . estradiol (ESTRACE) 0.1 MG/GM vaginal cream Place vaginally.    . montelukast (SINGULAIR) 10 MG tablet TAKE 1 TABLET (10 MG TOTAL) BY MOUTH AT BEDTIME. 90 tablet 2  . SUMAtriptan (IMITREX) 100 MG tablet Take 1 tablet (100 mg total) by mouth every 2 (two) hours as needed. 10 tablet 1  . triamterene-hydrochlorothiazide (MAXZIDE) 75-50 MG tablet TAKE 1 TABLET BY MOUTH EVERY DAY 90 tablet 1  . amLODipine (NORVASC) 5 MG tablet Take 1 tablet (5 mg total) by mouth daily. (Patient taking differently: Take 5 mg by mouth daily. 1/2 tab daily) 30 tablet 3   No facility-administered medications prior to visit.     Allergies  Allergen Reactions  . Codeine     REACTION: hyper  . Lisinopril Cough    Other reaction(s): Cough  . Losartan Other (See Comments)    Headaches  . Olmesartan Diarrhea    ROS Review of Systems    Objective:    Physical Exam  Constitutional: She is oriented to person, place, and time. She appears well-developed and well-nourished.  HENT:  Head: Normocephalic and atraumatic.  Cardiovascular: Normal rate, regular rhythm and normal heart sounds.  Pulmonary/Chest: Effort normal and breath sounds normal.  Musculoskeletal:     Comments: Trace pitting edema of the ankles bilaterally.  Neurological: She is alert and oriented to person, place, and time.  Skin: Skin is warm and dry.  Psychiatric: She has a normal mood and affect. Her behavior is normal.    BP (!)  129/59   Pulse 70   Ht 5\' 2"  (1.575 m)   Wt 149 lb (67.6 kg)   SpO2 99%   BMI 27.25 kg/m  Wt Readings from Last 3 Encounters:  10/21/18  149 lb (67.6 kg)  09/29/18 149 lb (67.6 kg)  07/05/18 148 lb (67.1 kg)     Health Maintenance Due  Topic Date Due  . Hepatitis C Screening  08/29/1950  . PNA vac Low Risk Adult (2 of 2 - PPSV23) 06/16/2018    There are no preventive care reminders to display for this patient.  Lab Results  Component Value Date   TSH 2.12 07/16/2018   Lab Results  Component Value Date   WBC 6.5 12/21/2017   HGB 10.7 (L) 12/21/2017   HCT 32.9 (L) 12/21/2017   MCV 79.7 (L) 12/21/2017   PLT 476 (H) 12/21/2017   Lab Results  Component Value Date   NA 135 07/16/2018   K 4.4 07/16/2018   CO2 31 07/16/2018   GLUCOSE 92 07/16/2018   BUN 9 07/16/2018   CREATININE 0.62 07/16/2018   BILITOT 0.4 07/16/2018   ALKPHOS 105 01/12/2014   AST 17 07/16/2018   ALT 15 07/16/2018   PROT 6.3 07/16/2018   ALBUMIN 4.0 01/12/2014   CALCIUM 9.2 07/16/2018   Lab Results  Component Value Date   CHOL 264 (H) 07/16/2018   Lab Results  Component Value Date   HDL 109 07/16/2018   Lab Results  Component Value Date   LDLCALC 139 (H) 07/16/2018   Lab Results  Component Value Date   TRIG 68 07/16/2018   Lab Results  Component Value Date   CHOLHDL 2.4 07/16/2018   No results found for: HGBA1C    Assessment & Plan:   Problem List Items Addressed This Visit      Cardiovascular and Mediastinum   Hypertension - Primary    Blood pressure well controlled on amlodipine.  She really wants to stick with the medication if possible.  We discussed going down to 2.5 mg which is really what she is already taking because she splitting the fives.  But again to have her take a half of the 2.5 to see if her blood pressure still looks well controlled especially since at home her systolics are usually less than 120 and to see if it improves her lower extremity swelling.  If not  then we discussed that we could certainly try different calcium channel blocker.      Relevant Medications   amLODipine (NORVASC) 2.5 MG tablet   Other Relevant Orders   CBC   Fe+TIBC+Fer     Respiratory   Asthma, moderate persistent    We discussed starting with an inhaled corticosteroid daily for the next 2 months and then if she is doing well at that point and rarely using her albuterol then she can discontinue the Qvar.  If after 2 weeks on the Qvar she still having more frequent than 3 times per week use of her albuterol but I want her to give [place] a call so that we can increase her to a combination such as Advair or Breo.      Relevant Orders   CBC   Fe+TIBC+Fer    Other Visit Diagnoses    SOB (shortness of breath)       Relevant Orders   CBC   Fe+TIBC+Fer   Localized swelling of both lower extremities         Shortness of breath-I think it is probably from a combination of worsening asthma over the last several weeks in addition to maybe some weight gain.  We did discuss some strategies around weight loss as well.  I agree with her that if  she is able to get some of that off I do think it would help.  But if she is not improving with the changes to her asthma regimen then please let me know and we will work this up further.  Consider chest x-ray and echocardiogram if not improving.  Lower extremity swelling-she had lab works not that long ago.  We did not do a CBC so I would like to rule out thyroid disorder as well as anemia.  I do think heart failure is very low potential risk at this point.  Meds ordered this encounter  Medications  . amLODipine (NORVASC) 2.5 MG tablet    Sig: Take 0.5 tablets (1.25 mg total) by mouth daily.    Dispense:  15 tablet    Refill:  1  . DISCONTD: beclomethasone (QVAR) 40 MCG/ACT inhaler    Sig: Inhale 2 puffs into the lungs 2 (two) times a day.    Dispense:  1 Inhaler    Refill:  12    Follow-up: Return if symptoms worsen or fail to  improve.    Beatrice Lecher, MD

## 2018-10-21 NOTE — Telephone Encounter (Signed)
I called and spoke with [facility] and the Qvar is not covered but Flovent is covered by insurance. Ok to change? Please advise.

## 2018-10-21 NOTE — Assessment & Plan Note (Signed)
We discussed starting with an inhaled corticosteroid daily for the next 2 months and then if she is doing well at that point and rarely using her albuterol then she can discontinue the Qvar.  If after 2 weeks on the Qvar she still having more frequent than 3 times per week use of her albuterol but I want her to give [place] a call so that we can increase her to a combination such as Advair or Breo.

## 2018-10-21 NOTE — Telephone Encounter (Signed)
Patient scheduled.

## 2018-10-21 NOTE — Assessment & Plan Note (Signed)
Blood pressure well controlled on amlodipine.  She really wants to stick with the medication if possible.  We discussed going down to 2.5 mg which is really what she is already taking because she splitting the fives.  But again to have her take a half of the 2.5 to see if her blood pressure still looks well controlled especially since at home her systolics are usually less than 120 and to see if it improves her lower extremity swelling.  If not then we discussed that we could certainly try different calcium channel blocker.

## 2018-10-22 LAB — CBC
HCT: 29.1 % — ABNORMAL LOW (ref 35.0–45.0)
Hemoglobin: 9 g/dL — ABNORMAL LOW (ref 11.7–15.5)
MCH: 24.3 pg — ABNORMAL LOW (ref 27.0–33.0)
MCHC: 30.9 g/dL — ABNORMAL LOW (ref 32.0–36.0)
MCV: 78.6 fL — ABNORMAL LOW (ref 80.0–100.0)
MPV: 8.7 fL (ref 7.5–12.5)
Platelets: 458 10*3/uL — ABNORMAL HIGH (ref 140–400)
RBC: 3.7 10*6/uL — ABNORMAL LOW (ref 3.80–5.10)
RDW: 13.9 % (ref 11.0–15.0)
WBC: 6.3 10*3/uL (ref 3.8–10.8)

## 2018-10-22 LAB — IRON,TIBC AND FERRITIN PANEL
%SAT: 6 % (calc) — ABNORMAL LOW (ref 16–45)
Ferritin: 3 ng/mL — ABNORMAL LOW (ref 16–288)
Iron: 32 ug/dL — ABNORMAL LOW (ref 45–160)
TIBC: 502 mcg/dL (calc) — ABNORMAL HIGH (ref 250–450)

## 2018-10-22 NOTE — Progress Notes (Signed)
This is [place] Planas PA-C Dr. Madilyn Fireman is out of the office. Your HgB has dropped from 10 months ago to 9.0. Are you taking any iron supplements? Anemia can make you feel tired and short of breath. Any black tarry stools?

## 2018-10-22 NOTE — Progress Notes (Signed)
Restart iron supplements. When metheney gets back she can follow up and review.

## 2018-10-26 ENCOUNTER — Other Ambulatory Visit: Payer: Self-pay | Admitting: *Deleted

## 2018-10-26 DIAGNOSIS — D509 Iron deficiency anemia, unspecified: Secondary | ICD-10-CM

## 2018-11-07 ENCOUNTER — Other Ambulatory Visit: Payer: Self-pay | Admitting: Family Medicine

## 2018-11-14 ENCOUNTER — Other Ambulatory Visit: Payer: Self-pay | Admitting: Family Medicine

## 2018-11-14 DIAGNOSIS — I10 Essential (primary) hypertension: Secondary | ICD-10-CM

## 2018-12-06 DIAGNOSIS — D509 Iron deficiency anemia, unspecified: Secondary | ICD-10-CM | POA: Diagnosis not present

## 2018-12-07 ENCOUNTER — Telehealth: Payer: Self-pay

## 2018-12-07 DIAGNOSIS — D500 Iron deficiency anemia secondary to blood loss (chronic): Secondary | ICD-10-CM

## 2018-12-07 DIAGNOSIS — D509 Iron deficiency anemia, unspecified: Secondary | ICD-10-CM

## 2018-12-07 LAB — HEMOGLOBIN: Hemoglobin: 11.8 g/dL (ref 11.7–15.5)

## 2018-12-07 LAB — FERRITIN: Ferritin: 13 ng/mL — ABNORMAL LOW (ref 16–288)

## 2018-12-07 LAB — IRON: Iron: 386 ug/dL — ABNORMAL HIGH (ref 45–160)

## 2018-12-07 NOTE — Telephone Encounter (Signed)
Ordered labs per result note.  °

## 2018-12-09 ENCOUNTER — Telehealth: Payer: Self-pay

## 2018-12-09 ENCOUNTER — Encounter: Payer: Self-pay | Admitting: Family Medicine

## 2018-12-09 ENCOUNTER — Other Ambulatory Visit: Payer: Self-pay

## 2018-12-09 ENCOUNTER — Ambulatory Visit (INDEPENDENT_AMBULATORY_CARE_PROVIDER_SITE_OTHER): Payer: Medicare Other | Admitting: Family Medicine

## 2018-12-09 ENCOUNTER — Telehealth: Payer: Medicare Other | Admitting: Physician Assistant

## 2018-12-09 VITALS — BP 127/82 | HR 75

## 2018-12-09 DIAGNOSIS — N3000 Acute cystitis without hematuria: Secondary | ICD-10-CM

## 2018-12-09 DIAGNOSIS — R300 Dysuria: Secondary | ICD-10-CM

## 2018-12-09 MED ORDER — SULFAMETHOXAZOLE-TRIMETHOPRIM 800-160 MG PO TABS: 1.0000 | ORAL_TABLET | Freq: Two times a day (BID) | ORAL | 0 refills | Status: DC

## 2018-12-09 NOTE — Progress Notes (Signed)
Patient asked to submit questionnaire for urinary symptoms as that seems to be the issue. Erroneous encounter. Disregard.

## 2018-12-09 NOTE — Progress Notes (Signed)
Pt reports her sxs began 2 days ago. Very painful especially in the morning(urine is very cloudy only in the morning. Clear all other times). She goes very frequently throughout the night. She said that when she goes in the morning it is a burning sensation.   She is unsure if this has anything to do with the Cystocele.   She has started riding her bike more. And she uses something for chaffing that has helped her some.  I asked her about the Estrace cream and she said that she doesn't feel comfortable using this due to having a Hx of BrCa.   Denies any abdominal,back,or flank pain. Maryruth Eve, [place] Crocker, CMA

## 2018-12-09 NOTE — Progress Notes (Signed)
Virtual Visit via Video Note  I connected with [name] on 12/09/18 at  2:40 PM EDT by a video enabled telemedicine application and verified that I am speaking with the correct person using two identifiers.   I discussed the limitations of evaluation and management by telemedicine and the availability of in person appointments. The patient expressed understanding and agreed to proceed.  Pt was at home and I was in my office for the virtual visit.     Subjective:    CC: Frequent urination.  HPI:   Pt reports her sxs began 2 days ago. Very painful especially in the morning(urine is very cloudy only in the morning. Clear all other times). She goes very frequently throughout the night. She said that when she goes in the morning it is a burning sensation.  He says it was quite uncomfortable this morning.  No blood in the urine.  No fever sweats or chills.  A little bit of back pain just with urination.  She is unsure if this has anything to do with the Cystocele.   She has started riding her bike more. And she uses something for chaffing that has helped her some. She changed creams today to [employer].   I asked her about the Estrace cream and she said that she doesn't feel comfortable using this due to having a Hx of BrCa.   No back pain, abdominal or flank pain.     Past medical history, Surgical history, Family history not pertinant except as noted below, Social history, Allergies, and medications have been entered into the medical record, reviewed, and corrections made.   Review of Systems: No fevers, chills, night sweats, weight loss, chest pain, or shortness of breath.   Objective:    General: Speaking clearly in complete sentences without any shortness of breath.  Alert and oriented x3.  Normal judgment. No apparent acute distress.    Impression and Recommendations:   Likely UTI-she did drop off a urine sample to the lab.  Hopefully will get that back  by tomorrow in the meantime go to go ahead and treat her based on current symptoms.  If she gets worse develops new symptoms or develops fever then please call us back immediately.    I discussed the assessment and treatment plan with the patient. The patient was provided an opportunity to ask questions and all were answered. The patient agreed with the plan and demonstrated an understanding of the instructions.   The patient was advised to call back or seek an in-person evaluation if the symptoms worsen or if the condition fails to improve as anticipated.   Beatrice Lecher, MD

## 2018-12-09 NOTE — Telephone Encounter (Signed)
Patient on schedule for possible UTI, having pain and frequency with urination. She denies fever, but states she just "doesn't feel good all over".  Pt's visit is virtual at 2:40.. OK to enter labs for pt to drop off samples at lab prior to appt?

## 2018-12-09 NOTE — Telephone Encounter (Signed)
Yes, labs in to give sample

## 2018-12-09 NOTE — Telephone Encounter (Signed)
Pt advised.

## 2018-12-10 ENCOUNTER — Encounter: Payer: Self-pay | Admitting: Family Medicine

## 2018-12-11 LAB — URINALYSIS
Bilirubin Urine: NEGATIVE
Glucose, UA: NEGATIVE
Ketones, ur: NEGATIVE
Nitrite: NEGATIVE
Protein, ur: NEGATIVE
Specific Gravity, Urine: 1.008 (ref 1.001–1.03)
pH: 7.5 (ref 5.0–8.0)

## 2018-12-11 LAB — URINE CULTURE
MICRO NUMBER:: 697739
SPECIMEN QUALITY:: ADEQUATE

## 2018-12-15 ENCOUNTER — Other Ambulatory Visit: Payer: Self-pay

## 2019-01-01 ENCOUNTER — Other Ambulatory Visit: Payer: Self-pay | Admitting: Family Medicine

## 2019-01-03 ENCOUNTER — Encounter: Payer: Self-pay | Admitting: Sports Medicine

## 2019-01-03 ENCOUNTER — Ambulatory Visit (INDEPENDENT_AMBULATORY_CARE_PROVIDER_SITE_OTHER): Payer: Medicare Other | Admitting: Sports Medicine

## 2019-01-03 ENCOUNTER — Other Ambulatory Visit: Payer: Self-pay

## 2019-01-03 ENCOUNTER — Ambulatory Visit (INDEPENDENT_AMBULATORY_CARE_PROVIDER_SITE_OTHER): Payer: Medicare Other

## 2019-01-03 DIAGNOSIS — M949 Disorder of cartilage, unspecified: Secondary | ICD-10-CM | POA: Diagnosis not present

## 2019-01-03 DIAGNOSIS — M25572 Pain in left ankle and joints of left foot: Secondary | ICD-10-CM | POA: Diagnosis not present

## 2019-01-03 DIAGNOSIS — M7989 Other specified soft tissue disorders: Secondary | ICD-10-CM | POA: Diagnosis not present

## 2019-01-03 DIAGNOSIS — M84375A Stress fracture, left foot, initial encounter for fracture: Secondary | ICD-10-CM

## 2019-01-03 DIAGNOSIS — M899 Disorder of bone, unspecified: Secondary | ICD-10-CM | POA: Diagnosis not present

## 2019-01-03 NOTE — Assessment & Plan Note (Addendum)
Present now for 5 months. 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 X-rays, MRI, cam boot. Referral to Dr. Raeford Razor for custom orthotics. Return to see me after she gets the orthotics.  Luckily there is no navicular stress fracture however there is an osteochondral lesion in the ankle joint itself as well as a large ankle ganglion cyst.  I would like her to return and we will probably do an ankle joint injection and then continue along the same previous plan.

## 2019-01-03 NOTE — Progress Notes (Addendum)
Subjective:    CC: Left ankle pain  HPI: This is a pleasant 68 year old female, for 5 months now she is in pain that she localizes along the medial left ankle, with swelling and fullness over the navicular itself, she has been doing increased riding of her bicycle, pain is moderate, persistent, localized without radiation.  I reviewed the past medical history, family history, social history, surgical history, and allergies today and no changes were needed.  Please see the problem list section below in epic for further details.  Past Medical History: Past Medical History:  Diagnosis Date  . Asthma   . Breast cancer ([place])   . HSV (herpes simplex virus) anogenital infection   . Hyperlipidemia   . Hypertension   . Meniere's disease   . Migraine with visual aura    optical  . Postmenopausal   . Volvulus ([place])    Past Surgical History: Past Surgical History:  Procedure Laterality Date  . COLON SURGERY  2003   resection/small bowel ischemia  . ESOPHAGEAL DILATION    . HERNIA REPAIR    . LTCS    . OVARIAN CYST REMOVAL    . SMALL INTESTINE SURGERY  02/2010  . TYMPANOSTOMY TUBE PLACEMENT     placement RT, out now.    Social History: Social History   Socioeconomic History  . Marital status: Married    Spouse name: Not on file  . Number of children: Not on file  . Years of education: Not on file  . Highest education level: Not on file  Occupational History  . Occupation: Environmental health practitioner    Comment: [employer]  . Financial resource strain: Not on file  . Food insecurity    Worry: Not on file    Inability: Not on file  . Transportation needs    Medical: Not on file    Non-medical: Not on file  Tobacco Use  . Smoking status: Never Smoker  . Smokeless tobacco: Never Used  Substance and Sexual Activity  . Alcohol use: Yes    Alcohol/week: 3.0 standard drinks    Types: 3 Glasses of wine per week    Comment: per week  . Drug use: No  . Sexual activity: Yes   Partners: Male  Lifestyle  . Physical activity    Days per week: Not on file    Minutes per session: Not on file  . Stress: Not on file  Relationships  . Social Herbalist on phone: Not on file    Gets together: Not on file    Attends religious service: Not on file    Active member of club or organization: Not on file    Attends meetings of clubs or organizations: Not on file    Relationship status: Not on file  Other Topics Concern  . Not on file  Social History Narrative   Working out twice per week.     Family History: Family History  Problem Relation Age of Onset  . Hypertension Mother   . Hypertension Father   . Depression Father   . Glaucoma Father   . Parkinsonism Father   . Depression Sister   . Depression Sister    Allergies: Allergies  Allergen Reactions  . Codeine     REACTION: hyper  . Lisinopril Cough    Other reaction(s): Cough  . Losartan Other (See Comments)    Headaches  . Olmesartan Diarrhea   Medications: See med rec.  Review of  Systems: No fevers, chills, night sweats, weight loss, chest pain, or shortness of breath.   Objective:    General: Well Developed, well nourished, and in no acute distress.  Neuro: Alert and oriented x3, extra-ocular muscles intact, sensation grossly intact.  HEENT: Normocephalic, atraumatic, pupils equal round reactive to light, neck supple, no masses, no lymphadenopathy, thyroid nonpalpable.  Skin: Warm and dry, no rashes. Cardiac: Regular rate and rhythm, no murmurs rubs or gallops, no lower extremity edema.  Respiratory: Clear to auscultation bilaterally. Not using accessory muscles, speaking in full sentences. Left ankle: No visible erythema or swelling. Range of motion is full in all directions. Strength is 5/5 in all directions. Stable lateral and medial ligaments; squeeze test and kleiger test unremarkable; Talar dome nontender; No pain at base of 5th MT; No tenderness over cuboid; Swollen  navicular prominence with tenderness to palpation. No tenderness on posterior aspects of lateral and medial malleolus No sign of peroneal tendon subluxations; Negative tarsal tunnel tinel's Able to walk 4 steps.  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 X-rays, MRI, cam boot. Referral to Dr. Raeford Razor for custom orthotics. Return to see me after she gets the orthotics.  Luckily there is no navicular stress fracture however there is an osteochondral lesion in the ankle joint itself as well as a large ankle ganglion cyst.  I would like her to return and we will probably do an ankle joint injection and then continue along the same previous plan.   ___________________________________________ Gwen Her. Dianah Field, M.D., ABFM., CAQSM. Primary Care and Sports Medicine [facility]  Adjunct Professor of [employer]

## 2019-01-09 ENCOUNTER — Other Ambulatory Visit: Payer: Self-pay

## 2019-01-09 ENCOUNTER — Ambulatory Visit (INDEPENDENT_AMBULATORY_CARE_PROVIDER_SITE_OTHER): Payer: Medicare Other

## 2019-01-09 DIAGNOSIS — M84375A Stress fracture, left foot, initial encounter for fracture: Secondary | ICD-10-CM

## 2019-01-09 DIAGNOSIS — M66872 Spontaneous rupture of other tendons, left ankle and foot: Secondary | ICD-10-CM | POA: Diagnosis not present

## 2019-01-09 DIAGNOSIS — R600 Localized edema: Secondary | ICD-10-CM | POA: Diagnosis not present

## 2019-01-09 DIAGNOSIS — M67472 Ganglion, left ankle and foot: Secondary | ICD-10-CM | POA: Diagnosis not present

## 2019-01-09 DIAGNOSIS — M25872 Other specified joint disorders, left ankle and foot: Secondary | ICD-10-CM | POA: Diagnosis not present

## 2019-01-12 ENCOUNTER — Other Ambulatory Visit: Payer: Self-pay

## 2019-01-12 ENCOUNTER — Ambulatory Visit (INDEPENDENT_AMBULATORY_CARE_PROVIDER_SITE_OTHER): Payer: Medicare Other | Admitting: Sports Medicine

## 2019-01-12 DIAGNOSIS — M949 Disorder of cartilage, unspecified: Secondary | ICD-10-CM | POA: Diagnosis not present

## 2019-01-12 DIAGNOSIS — M899 Disorder of bone, unspecified: Secondary | ICD-10-CM

## 2019-01-12 DIAGNOSIS — M25572 Pain in left ankle and joints of left foot: Secondary | ICD-10-CM

## 2019-01-12 NOTE — Progress Notes (Signed)
Subjective:    CC: Left ankle pain  HPI: This is a very pleasant 68 year old female, we have been seeing her for chronic left ankle pain, we obtained an MRI that showed a medial talar dome OCD, she also has a posterior tibiotalar ganglion cyst.  Pain is only at the medial ankle.  Moderate, persistent, localized without radiation.  I reviewed the past medical history, family history, social history, surgical history, and allergies today and no changes were needed.  Please see the problem list section below in epic for further details.  Past Medical History: Past Medical History:  Diagnosis Date  . Asthma   . Breast cancer ([place])   . HSV (herpes simplex virus) anogenital infection   . Hyperlipidemia   . Hypertension   . Meniere's disease   . Migraine with visual aura    optical  . Postmenopausal   . Volvulus ([place])    Past Surgical History: Past Surgical History:  Procedure Laterality Date  . COLON SURGERY  2003   resection/small bowel ischemia  . ESOPHAGEAL DILATION    . HERNIA REPAIR    . LTCS    . OVARIAN CYST REMOVAL    . SMALL INTESTINE SURGERY  02/2010  . TYMPANOSTOMY TUBE PLACEMENT     placement RT, out now.    Social History: Social History   Socioeconomic History  . Marital status: Married    Spouse name: Not on file  . Number of children: Not on file  . Years of education: Not on file  . Highest education level: Not on file  Occupational History  . Occupation: Environmental health practitioner    Comment: [employer]  . Financial resource strain: Not on file  . Food insecurity    Worry: Not on file    Inability: Not on file  . Transportation needs    Medical: Not on file    Non-medical: Not on file  Tobacco Use  . Smoking status: Never Smoker  . Smokeless tobacco: Never Used  Substance and Sexual Activity  . Alcohol use: Yes    Alcohol/week: 3.0 standard drinks    Types: 3 Glasses of wine per week    Comment: per week  . Drug use: No  . Sexual activity:  Yes    Partners: Male  Lifestyle  . Physical activity    Days per week: Not on file    Minutes per session: Not on file  . Stress: Not on file  Relationships  . Social Herbalist on phone: Not on file    Gets together: Not on file    Attends religious service: Not on file    Active member of club or organization: Not on file    Attends meetings of clubs or organizations: Not on file    Relationship status: Not on file  Other Topics Concern  . Not on file  Social History Narrative   Working out twice per week.     Family History: Family History  Problem Relation Age of Onset  . Hypertension Mother   . Hypertension Father   . Depression Father   . Glaucoma Father   . Parkinsonism Father   . Depression Sister   . Depression Sister    Allergies: Allergies  Allergen Reactions  . Codeine     REACTION: hyper  . Lisinopril Cough    Other reaction(s): Cough  . Losartan Other (See Comments)    Headaches  . Olmesartan Diarrhea   Medications:  See med rec.  Review of Systems: No fevers, chills, night sweats, weight loss, chest pain, or shortness of breath.   Objective:    General: Well Developed, well nourished, and in no acute distress.  Neuro: Alert and oriented x3, extra-ocular muscles intact, sensation grossly intact.  HEENT: Normocephalic, atraumatic, pupils equal round reactive to light, neck supple, no masses, no lymphadenopathy, thyroid nonpalpable.  Skin: Warm and dry, no rashes. Cardiac: Regular rate and rhythm, no murmurs rubs or gallops, no lower extremity edema.  Respiratory: Clear to auscultation bilaterally. Not using accessory muscles, speaking in full sentences. Left ankle: No visible erythema or swelling. Range of motion is full in all directions. Strength is 5/5 in all directions. Stable lateral and medial ligaments; squeeze test and kleiger test unremarkable; Tender to palpation of the medial talar dome No pain at base of 5th MT; No  tenderness over cuboid; No tenderness over N spot or navicular prominence No tenderness on posterior aspects of lateral and medial malleolus No sign of peroneal tendon subluxations; Negative tarsal tunnel tinel's Able to walk 4 steps.  Procedure: Real-time Ultrasound Guided injection of the left tibiotalar joint Device: GE Logiq E  Verbal informed consent obtained.  Time-out conducted.  Noted no overlying erythema, induration, or other signs of local infection.  Skin prepped in a sterile fashion.  Local anesthesia: Topical Ethyl chloride.  With sterile technique and under real time ultrasound guidance:  1 cc Kenalog 40, 1 cc lidocaine, 1 cc bupivacaine injected easily Completed without difficulty  Pain immediately resolved suggesting accurate placement of the medication.  Advised to call if fevers/chills, erythema, induration, drainage, or persistent bleeding.  Images permanently stored and available for review in the ultrasound unit.  Impression: Technically successful ultrasound guided injection.  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 MRI shows an osteochondral lesion of the medial talar dome and a posterior tibiotalar ganglion cyst. Tibiotalar joint injection today. Return to see me in 1 month, continue boot for an additional week.   ___________________________________________ Gwen Her. Dianah Field, M.D., ABFM., CAQSM. Primary Care and Sports Medicine [facility]  Adjunct Professor of [employer] [facility] [employer]

## 2019-01-12 NOTE — Assessment & Plan Note (Signed)
5 months of symptoms, pain is noted no longer at the navicular prominence but more over the left medial talar dome. MRI shows an osteochondral lesion of the medial talar dome and a posterior tibiotalar ganglion cyst. Tibiotalar joint injection today. Return to see me in 1 month, continue boot for an additional week.

## 2019-01-17 DIAGNOSIS — D509 Iron deficiency anemia, unspecified: Secondary | ICD-10-CM | POA: Diagnosis not present

## 2019-01-18 ENCOUNTER — Other Ambulatory Visit: Payer: Self-pay | Admitting: *Deleted

## 2019-01-18 LAB — IRON: Iron: 66 ug/dL (ref 45–160)

## 2019-01-18 LAB — FERRITIN: Ferritin: 12 ng/mL — ABNORMAL LOW (ref 16–288)

## 2019-01-18 LAB — HEMOGLOBIN: Hemoglobin: 12.9 g/dL (ref 11.7–15.5)

## 2019-01-19 ENCOUNTER — Other Ambulatory Visit: Payer: Self-pay

## 2019-01-19 ENCOUNTER — Encounter: Payer: Self-pay | Admitting: Family Medicine

## 2019-01-19 ENCOUNTER — Ambulatory Visit (INDEPENDENT_AMBULATORY_CARE_PROVIDER_SITE_OTHER): Payer: Medicare Other | Admitting: Family Medicine

## 2019-01-19 VITALS — Ht 62.0 in | Wt 145.0 lb

## 2019-01-19 DIAGNOSIS — M899 Disorder of bone, unspecified: Secondary | ICD-10-CM | POA: Diagnosis not present

## 2019-01-19 DIAGNOSIS — M949 Disorder of cartilage, unspecified: Secondary | ICD-10-CM | POA: Diagnosis not present

## 2019-01-19 NOTE — Progress Notes (Signed)
[name] - 68 y.o. female MRN [id number]  Date of birth: [birth year]  SUBJECTIVE:  Including CC & ROS.  Chief Complaint  Patient presents with  . Foot Orthotics    [name] is a 68 y.o. female that is presenting with left ankle pain.  She was found to have an osteochondral defect on the talar dome of the left ankle.  She has more excessive pronation on the left when compared to the right.  Pain is been intermittent in nature.  She denies any specific inciting event..  Independent review of the left MRI of the ankle from 8/23 shows a small osteochondral defect of the talar dome   Review of Systems  Constitutional: Negative for fever.  HENT: Negative for congestion.   Respiratory: Negative for cough.   Cardiovascular: Negative for chest pain.  Gastrointestinal: Negative for abdominal pain.  Skin: Negative for color change.    HISTORY: Past Medical, Surgical, Social, and Family History Reviewed & Updated per EMR.   Pertinent Historical Findings include:  Past Medical History:  Diagnosis Date  . Asthma   . Breast cancer ([place])   . HSV (herpes simplex virus) anogenital infection   . Hyperlipidemia   . Hypertension   . Meniere's disease   . Migraine with visual aura    optical  . Postmenopausal   . Volvulus ([place])     Past Surgical History:  Procedure Laterality Date  . COLON SURGERY  2003   resection/small bowel ischemia  . ESOPHAGEAL DILATION    . HERNIA REPAIR    . LTCS    . OVARIAN CYST REMOVAL    . SMALL INTESTINE SURGERY  02/2010  . TYMPANOSTOMY TUBE PLACEMENT     placement RT, out now.     Allergies  Allergen Reactions  . Codeine     REACTION: hyper  . Lisinopril Cough    Other reaction(s): Cough  . Losartan Other (See Comments)    Headaches  . Olmesartan Diarrhea    Family History  Problem Relation Age of Onset  . Hypertension Mother   . Hypertension Father   . Depression Father   . Glaucoma Father   . Parkinsonism Father    . Depression Sister   . Depression Sister      Social History   Socioeconomic History  . Marital status: Married    Spouse name: Not on file  . Number of children: Not on file  . Years of education: Not on file  . Highest education level: Not on file  Occupational History  . Occupation: Environmental health practitioner    Comment: [employer]  . Financial resource strain: Not on file  . Food insecurity    Worry: Not on file    Inability: Not on file  . Transportation needs    Medical: Not on file    Non-medical: Not on file  Tobacco Use  . Smoking status: Never Smoker  . Smokeless tobacco: Never Used  Substance and Sexual Activity  . Alcohol use: Yes    Alcohol/week: 3.0 standard drinks    Types: 3 Glasses of wine per week    Comment: per week  . Drug use: No  . Sexual activity: Yes    Partners: Male  Lifestyle  . Physical activity    Days per week: Not on file    Minutes per session: Not on file  . Stress: Not on file  Relationships  . Social Herbalist on  phone: Not on file    Gets together: Not on file    Attends religious service: Not on file    Active member of club or organization: Not on file    Attends meetings of clubs or organizations: Not on file    Relationship status: Not on file  . Intimate partner violence    Fear of current or ex partner: Not on file    Emotionally abused: Not on file    Physically abused: Not on file    Forced sexual activity: Not on file  Other Topics Concern  . Not on file  Social History Narrative   Working out twice per week.       PHYSICAL EXAM:  VS: Ht 5\' 2"  (1.575 m)   Wt 145 lb (65.8 kg)   BMI 26.52 kg/m  Physical Exam Gen: NAD, alert, cooperative with exam, well-appearing ENT: normal lips, normal nasal mucosa,  Eye: normal EOM, normal conjunctiva and lids CV:  no edema, +2 pedal pulses   Resp: no accessory muscle use, non-labored,  Skin: no rashes, no areas of induration  Neuro: normal tone, normal  sensation to touch Psych:  normal insight, alert and oriented MSK:  Left foot/ankle:  No obvious swelling or ecchymosis. Obvious more pronation on the left compared to the right. Normal range of motion. Neurovascular intact  Patient was fitted for a standard, cushioned, semi-rigid orthotic. The orthotic was heated and afterward the patient stood on the orthotic blank positioned on the orthotic stand. The patient was positioned in subtalar neutral position and 10 degrees of ankle dorsiflexion in a weight bearing stance. After completion of molding, a stable base was applied to the orthotic blank. The blank was ground to a stable position for weight bearing. Size: 8 Base: Blue EVA Additional Posting and Padding: None The patient ambulated these, and they were very comfortable.        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  Has excessive pronation on the left compared to the right. -Orthotics today. -May benefit from a scaphoid pad on the left going forward.  Also may need first ray post -could consider Prolia for defect

## 2019-01-19 NOTE — Assessment & Plan Note (Signed)
Osteochondral defect was appreciated on MRI.  Has excessive pronation on the left compared to the right. -Orthotics today. -May benefit from a scaphoid pad on the left going forward.  Also may need first ray post -could consider Prolia for defect

## 2019-01-20 ENCOUNTER — Encounter: Payer: Medicare Other | Admitting: Family Medicine

## 2019-01-27 DIAGNOSIS — E611 Iron deficiency: Secondary | ICD-10-CM | POA: Insufficient documentation

## 2019-02-08 ENCOUNTER — Other Ambulatory Visit: Payer: Self-pay | Admitting: Family Medicine

## 2019-02-08 DIAGNOSIS — I10 Essential (primary) hypertension: Secondary | ICD-10-CM

## 2019-02-09 ENCOUNTER — Ambulatory Visit: Payer: Medicare Other | Admitting: Sports Medicine

## 2019-02-15 ENCOUNTER — Encounter: Payer: Self-pay | Admitting: Sports Medicine

## 2019-02-15 ENCOUNTER — Ambulatory Visit (INDEPENDENT_AMBULATORY_CARE_PROVIDER_SITE_OTHER): Payer: Medicare Other | Admitting: Sports Medicine

## 2019-02-15 ENCOUNTER — Other Ambulatory Visit: Payer: Self-pay

## 2019-02-15 DIAGNOSIS — M949 Disorder of cartilage, unspecified: Secondary | ICD-10-CM

## 2019-02-15 DIAGNOSIS — M899 Disorder of bone, unspecified: Secondary | ICD-10-CM | POA: Diagnosis not present

## 2019-02-15 MED ORDER — DICLOFENAC SODIUM 2 % TD SOLN: 2.0000 | Freq: Two times a day (BID) | TRANSDERMAL | 0 refills | Status: DC

## 2019-02-15 NOTE — Assessment & Plan Note (Addendum)
Pain now resolved after tibiotalar joint injection. She also got her custom orthotics about 3 weeks ago which have helped significantly. She has a bit of pain at the first MTP, topical Pennsaid given to use as needed, I can call her in some more should she desire.

## 2019-02-15 NOTE — Progress Notes (Signed)
Subjective:    CC: Follow-up  HPI: This is a pleasant 68 year old female, we did a tibiotalar joint injection at the last visit, MRI showed a talar OCD.  Her ankle pain has resolved, she had orthotics made, they are very comfortable, overall she is happy with the exception of a bit of discomfort in her first MTP.  I reviewed the past medical history, family history, social history, surgical history, and allergies today and no changes were needed.  Please see the problem list section below in epic for further details.  Past Medical History: Past Medical History:  Diagnosis Date  . Asthma   . Breast cancer ([place])   . HSV (herpes simplex virus) anogenital infection   . Hyperlipidemia   . Hypertension   . Meniere's disease   . Migraine with visual aura    optical  . Postmenopausal   . Volvulus ([place])    Past Surgical History: Past Surgical History:  Procedure Laterality Date  . COLON SURGERY  2003   resection/small bowel ischemia  . ESOPHAGEAL DILATION    . HERNIA REPAIR    . LTCS    . OVARIAN CYST REMOVAL    . SMALL INTESTINE SURGERY  02/2010  . TYMPANOSTOMY TUBE PLACEMENT     placement RT, out now.    Social History: Social History   Socioeconomic History  . Marital status: Married    Spouse name: Not on file  . Number of children: Not on file  . Years of education: Not on file  . Highest education level: Not on file  Occupational History  . Occupation: Environmental health practitioner    Comment: [employer]  . Financial resource strain: Not on file  . Food insecurity    Worry: Not on file    Inability: Not on file  . Transportation needs    Medical: Not on file    Non-medical: Not on file  Tobacco Use  . Smoking status: Never Smoker  . Smokeless tobacco: Never Used  Substance and Sexual Activity  . Alcohol use: Yes    Alcohol/week: 3.0 standard drinks    Types: 3 Glasses of wine per week    Comment: per week  . Drug use: No  . Sexual activity: Yes    Partners:  Male  Lifestyle  . Physical activity    Days per week: Not on file    Minutes per session: Not on file  . Stress: Not on file  Relationships  . Social Herbalist on phone: Not on file    Gets together: Not on file    Attends religious service: Not on file    Active member of club or organization: Not on file    Attends meetings of clubs or organizations: Not on file    Relationship status: Not on file  Other Topics Concern  . Not on file  Social History Narrative   Working out twice per week.     Family History: Family History  Problem Relation Age of Onset  . Hypertension Mother   . Hypertension Father   . Depression Father   . Glaucoma Father   . Parkinsonism Father   . Depression Sister   . Depression Sister    Allergies: Allergies  Allergen Reactions  . Codeine     REACTION: hyper  . Lisinopril Cough    Other reaction(s): Cough  . Losartan Other (See Comments)    Headaches  . Olmesartan Diarrhea   Medications: See  med rec.  Review of Systems: No fevers, chills, night sweats, weight loss, chest pain, or shortness of breath.   Objective:    General: Well Developed, well nourished, and in no acute distress.  Neuro: Alert and oriented x3, extra-ocular muscles intact, sensation grossly intact.  HEENT: Normocephalic, atraumatic, pupils equal round reactive to light, neck supple, no masses, no lymphadenopathy, thyroid nonpalpable.  Skin: Warm and dry, no rashes. Cardiac: Regular rate and rhythm, no murmurs rubs or gallops, no lower extremity edema.  Respiratory: Clear to auscultation bilaterally. Not using accessory muscles, speaking in full sentences. Left ankle: No visible erythema or swelling. Range of motion is full in all directions. Strength is 5/5 in all directions. Stable lateral and medial ligaments; squeeze test and kleiger test unremarkable; Talar dome nontender; No pain at base of 5th MT; No tenderness over cuboid; There is a bit of  fullness over the navicular but no tenderness. No tenderness on posterior aspects of lateral and medial malleolus No sign of peroneal tendon subluxations; Negative tarsal tunnel tinel's Able to walk 4 steps.  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 She also got her custom orthotics about 3 weeks ago which have helped significantly. She has a bit of pain at the first MTP, topical Pennsaid given to use as needed, I can call her in some more should she desire.   ___________________________________________ Gwen Her. Dianah Field, M.D., ABFM., CAQSM. Primary Care and Sports Medicine [facility]  Adjunct Professor of [place] [employer]

## 2019-02-17 ENCOUNTER — Other Ambulatory Visit: Payer: Self-pay | Admitting: Family Medicine

## 2019-03-17 DIAGNOSIS — M79672 Pain in left foot: Secondary | ICD-10-CM | POA: Diagnosis not present

## 2019-03-17 DIAGNOSIS — M216X2 Other acquired deformities of left foot: Secondary | ICD-10-CM | POA: Diagnosis not present

## 2019-03-17 DIAGNOSIS — M2140 Flat foot [pes planus] (acquired), unspecified foot: Secondary | ICD-10-CM | POA: Diagnosis not present

## 2019-05-30 DIAGNOSIS — M79672 Pain in left foot: Secondary | ICD-10-CM | POA: Diagnosis not present

## 2019-05-30 DIAGNOSIS — M2042 Other hammer toe(s) (acquired), left foot: Secondary | ICD-10-CM | POA: Diagnosis not present

## 2019-05-30 DIAGNOSIS — M216X2 Other acquired deformities of left foot: Secondary | ICD-10-CM | POA: Diagnosis not present

## 2019-05-30 DIAGNOSIS — M2041 Other hammer toe(s) (acquired), right foot: Secondary | ICD-10-CM | POA: Diagnosis not present

## 2019-06-02 NOTE — Progress Notes (Signed)
Subjective:   [name] is a 69 y.o. female who presents for Medicare Annual (Subsequent) preventive examination.  Review of Systems:  No ROS.  Medicare Wellness Virtual Visit.  Visual/audio telehealth visit, UTA vital signs.   See social history for additional risk factors.    Cardiac Risk Factors include: advanced age (>102men, >76 women);hypertension Sleep patterns: Getting 7 hours of sleep a night. Wakes up 1 time during the night to void. Wakes up and feels sluggish. Home Safety/Smoke Alarms: Feels safe in home. Smoke alarms in place.  Living environment; Lives with husband in 2 story home and stairs have handrails on them. Shower is a walk in shower and no grab bars in place. Seat Belt Safety/Bike Helmet: Wears seat belt.   Female:   Pap- Aged out      [facility]- scheduled January 28th at [facility].      Dexa scan- ordered while on phone        CCS- UTD     Objective:     Vitals: There were no vitals taken for this visit.  There is no height or weight on file to calculate BMI.  Advanced Directives 06/06/2019 12/29/2017 08/05/2017 06/16/2017 04/04/2016  Does Patient Have a Medical Advance Directive? Yes Yes Yes Yes Yes  Type of Paramedic of [name];Living will [place];Living will [place];Living will [place];Living will [place];Living will  Does patient want to make changes to medical advance directive? No - Patient declined - - No - Patient declined No - Patient declined  Copy of [place] in Chart? No - copy requested Yes No - copy requested No - copy requested -    Tobacco Social History   Tobacco Use  Smoking Status Never Smoker  Smokeless Tobacco Never Used     Counseling given: Not Answered   Clinical Intake:  Pre-visit preparation completed: Yes  Pain : No/denies pain     Nutritional Risks: None Diabetes:  No  How often do you need to have someone help you when you read instructions, pamphlets, or other written materials from your doctor or pharmacy?: 1 - Never What is the last grade level you completed in school?: 14  Interpreter Needed?: No  Information entered by :: Orlie Dakin, LPN  Past Medical History:  Diagnosis Date  . Asthma   . Breast cancer ([place])   . HSV (herpes simplex virus) anogenital infection   . Hyperlipidemia   . Hypertension   . Meniere's disease   . Migraine with visual aura    optical  . Postmenopausal   . Volvulus ([place])    Past Surgical History:  Procedure Laterality Date  . COLON SURGERY  2003   resection/small bowel ischemia  . ESOPHAGEAL DILATION    . HERNIA REPAIR    . LTCS    . OVARIAN CYST REMOVAL    . SMALL INTESTINE SURGERY  02/2010  . TYMPANOSTOMY TUBE PLACEMENT     placement RT, out now.    Family History  Problem Relation Age of Onset  . Hypertension Mother   . Hypertension Father   . Depression Father   . Glaucoma Father   . Parkinsonism Father   . Depression Sister   . Depression Sister    Social History   Socioeconomic History  . Marital status: Married    Spouse name: Louie Casa  . Number of children: 1  . Years of education: 35  .  Highest education level: Some college, no degree  Occupational History  . 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 Smoking status: Never Smoker  . Smokeless tobacco: Never Used  Substance and Sexual Activity  . Alcohol use: Yes    Alcohol/week: 3.0 standard drinks    Types: 3 Glasses of wine per week    Comment: per week  . Drug use: No  . Sexual activity: Yes    Partners: Male  Other Topics Concern  . Not on file  Social History Narrative   Read paper with coffee. Catches up on emails. Walks. Volunteers at her church and  [employer].  Gets together with grand children.     Social Determinants of Health   Financial Resource Strain:   . Difficulty of  Paying Living Expenses: Not on file  Food Insecurity:   . Worried About Charity fundraiser in the Last Year: Not on file  . Ran Out of Food in the Last Year: Not on file  Transportation Needs:   . Lack of Transportation (Medical): Not on file  . Lack of Transportation (Non-Medical): Not on file  Physical Activity:   . Days of Exercise per Week: Not on file  . Minutes of Exercise per Session: Not on file  Stress:   . Feeling of Stress : Not on file  Social Connections:   . Frequency of Communication with Friends and Family: Not on file  . Frequency of Social Gatherings with Friends and Family: Not on file  . Attends Religious Services: Not on file  . Active Member of Clubs or Organizations: Not on file  . Attends Archivist Meetings: Not on file  . Marital Status: Not on file    Outpatient Encounter Medications as of 06/06/2019  Medication Sig  . albuterol (VENTOLIN HFA) 108 (90 Base) MCG/ACT inhaler TAKE 2 PUFFS BY MOUTH EVERY 6 HOURS AS NEEDED  . amLODipine (NORVASC) 2.5 MG tablet TAKE 0.5 TABLETS (1.25 MG TOTAL) BY MOUTH DAILY.  Marland Kitchen Cholecalciferol 1000 units CHEW Chew by mouth.  . citalopram (CELEXA) 20 MG tablet TAKE 1 TABLET BY MOUTH EVERY DAY  . montelukast (SINGULAIR) 10 MG tablet TAKE 1 TABLET (10 MG TOTAL) BY MOUTH AT BEDTIME.  . SUMAtriptan (IMITREX) 100 MG tablet Take 1 tablet (100 mg total) by mouth every 2 (two) hours as needed.  . triamterene-hydrochlorothiazide (MAXZIDE) 75-50 MG tablet TAKE 1 TABLET BY MOUTH EVERY DAY  . Diclofenac Sodium 2 % SOLN Place 2 sprays onto the skin 2 (two) times daily.   No facility-administered encounter medications on file as of 06/06/2019.    Activities of Daily Living In your present state of health, do you have any difficulty performing the following activities: 06/06/2019  Hearing? Y  Comment deaf in right ear  Vision? N  Difficulty concentrating or making decisions? N  Walking or climbing stairs? N  Dressing or  bathing? N  Doing errands, shopping? N  Preparing Food and eating ? N  Using the Toilet? N  In the past six months, have you accidently leaked urine? N  Do you have problems with loss of bowel control? N  Managing your Medications? N  Managing your Finances? N  Housekeeping or managing your Housekeeping? N  Some recent data might be hidden    Patient Care Team: Hali Marry, MD as PCP - General (Family Medicine)    Assessment:   This is a routine wellness examination for  [name].Physical assessment deferred to PCP.   Exercise Activities and Dietary recommendations Current Exercise Habits: Home exercise routine, Type of exercise: walking(biking when weather permits), Time (Minutes): 30, Frequency (Times/Week): 3, Weekly Exercise (Minutes/Week): 90, Intensity: Mild Diet  Eats a healthy diet of vegetables, fruits and proteins. Breakfast: Cereal with blueberries Lunch: sandwich Dinner: Meat and vegetables or soup and salad. Drinks water 7-8 glasses daily.      Goals    . Exercise 3x per week (30 min per time)     Increase number of times she exercises and loose 15 lbs.       Fall Risk Fall Risk  06/06/2019 12/15/2018 06/16/2017 05/25/2017 02/26/2016  Falls in the past year? 0 0 No No Yes  Comment - [name]: data to providers prior to load - - -  Number falls in past yr: - - - - 1  Injury with Fall? 0 - - - No  Risk for fall due to : No Fall Risks - - - Other (Comment)  Follow up Falls prevention discussed - - - -   Is the patient's home free of loose throw rugs in walkways, pet beds, electrical cords, etc?   yes      Grab bars in the bathroom? no      Handrails on the stairs?   yes      Adequate lighting?   yes   Depression Screen PHQ 2/9 Scores 06/06/2019 09/29/2018 07/05/2018 06/16/2017  PHQ - 2 Score 0 0 0 0  PHQ- 9 Score - - - -     Cognitive Function     6CIT Screen 06/06/2019 06/16/2017 02/26/2016  What Year? 0 points 0 points 0 points  What  month? 0 points 0 points 0 points  What time? 0 points 0 points 0 points  Count back from 20 0 points 0 points 0 points  Months in reverse 0 points 0 points 0 points  Repeat phrase 0 points 0 points 0 points  Total Score 0 0 0    Immunization History  Administered Date(s) Administered  . Influenza Split 06/08/2012  . Influenza Whole 05/20/2004, 04/09/2007, 02/17/2008, 02/19/2009, 03/06/2010  . Influenza,inj,Quad PF,6+ Mos 02/03/2013, 01/17/2014, 02/08/2015, 02/26/2016, 03/02/2018  . Pneumococcal Conjugate-13 06/16/2017  . Pneumococcal Polysaccharide-23 08/29/2009  . Td 10/20/2006  . Tdap 06/29/2017  . Zoster Recombinat (Shingrix) 07/01/2017    Screening Tests Health Maintenance  Topic Date Due  . Hepatitis C Screening  11-10-1950  . PNA vac Low Risk Adult (2 of 2 - PPSV23) 06/16/2018  . INFLUENZA VACCINE  12/18/2018  . MAMMOGRAM  04/15/2019  . COLONOSCOPY  12/26/2021  . TETANUS/TDAP  06/30/2027  . DEXA SCAN  Completed       Plan:    Please schedule your next medicare wellness visit with me in 1 yr.  [name] , Thank you for taking time to come for your Medicare Wellness Visit. I appreciate your ongoing commitment to your health goals. Please review the following plan we discussed and let me know if I can assist you in the future.  Continue doing brain stimulating activities (puzzles, reading, adult coloring books, staying active) to keep memory sharp.  Bring a copy of your living will and/or healthcare power of attorney to your next office visit.   These are the goals we discussed: Goals    . Exercise 3x per week (30 min per time)     Increase number of times she exercises and loose 15 lbs.  This is a list of the screening recommended for you and due dates:  Health Maintenance  Topic Date Due  .  Hepatitis C: One time screening is recommended by Center for Disease Control  (CDC) for  adults born from [birth year] through 1965.   Oct 14, 1950  . Pneumonia vaccines  (2 of 2 - PPSV23) 06/16/2018  . Mammogram  04/15/2019  . Flu Shot  08/17/2019*  . Colon Cancer Screening  12/26/2021  . Tetanus Vaccine  06/30/2027  . DEXA scan (bone density measurement)  Completed  *Topic was postponed. The date shown is not the original due date.     I have personally reviewed and noted the following in the patient's chart:   . Medical and social history . Use of alcohol, tobacco or illicit drugs  . Current medications and supplements . Functional ability and status . Nutritional status . Physical activity . Advanced directives . List of other physicians . Hospitalizations, surgeries, and ER visits in previous 12 months . Vitals . Screenings to include cognitive, depression, and falls . Referrals and appointments  In addition, I have reviewed and discussed with patient certain preventive protocols, quality metrics, and best practice recommendations. A written personalized care plan for preventive services as well as general preventive health recommendations were provided to patient.     Joanne Chars, LPN  [phone]

## 2019-06-06 ENCOUNTER — Ambulatory Visit (INDEPENDENT_AMBULATORY_CARE_PROVIDER_SITE_OTHER): Payer: Medicare HMO | Admitting: *Deleted

## 2019-06-06 VITALS — Ht 62.0 in | Wt 146.0 lb

## 2019-06-06 DIAGNOSIS — Z0000 Encounter for general adult medical examination without abnormal findings: Secondary | ICD-10-CM | POA: Diagnosis not present

## 2019-06-06 DIAGNOSIS — Z1159 Encounter for screening for other viral diseases: Secondary | ICD-10-CM

## 2019-06-06 DIAGNOSIS — Z13820 Encounter for screening for osteoporosis: Secondary | ICD-10-CM

## 2019-06-06 NOTE — Patient Instructions (Addendum)
Please schedule your next medicare wellness visit with me in 1 yr.  [name] , Thank you for taking time to come for your Medicare Wellness Visit. I appreciate your ongoing commitment to your health goals. Please review the following plan we discussed and let me know if I can assist you in the future.  Continue doing brain stimulating activities (puzzles, reading, adult coloring books, staying active) to keep memory sharp.  Bring a copy of your living will and/or healthcare power of attorney to your next office visit. These are the goals we discussed: Goals    . Exercise 3x per week (30 min per time)     Increase number of times she exercises and loose 15 lbs.      They will be calling you to schedule your bone density scan. Go to the lab at your convience to have the Hepatitis C drawn.

## 2019-06-14 DIAGNOSIS — Z1159 Encounter for screening for other viral diseases: Secondary | ICD-10-CM | POA: Diagnosis not present

## 2019-06-15 ENCOUNTER — Ambulatory Visit (INDEPENDENT_AMBULATORY_CARE_PROVIDER_SITE_OTHER): Payer: Medicare HMO

## 2019-06-15 ENCOUNTER — Other Ambulatory Visit: Payer: Self-pay

## 2019-06-15 DIAGNOSIS — Z780 Asymptomatic menopausal state: Secondary | ICD-10-CM | POA: Diagnosis not present

## 2019-06-15 DIAGNOSIS — Z13820 Encounter for screening for osteoporosis: Secondary | ICD-10-CM

## 2019-06-15 DIAGNOSIS — M85851 Other specified disorders of bone density and structure, right thigh: Secondary | ICD-10-CM | POA: Diagnosis not present

## 2019-06-15 LAB — HEPATITIS C ANTIBODY
Hepatitis C Ab: NONREACTIVE
SIGNAL TO CUT-OFF: 0.01 (ref <1.00)

## 2019-06-16 DIAGNOSIS — Z1231 Encounter for screening mammogram for malignant neoplasm of breast: Secondary | ICD-10-CM | POA: Diagnosis not present

## 2019-06-16 LAB — HM MAMMOGRAPHY

## 2019-06-22 DIAGNOSIS — H524 Presbyopia: Secondary | ICD-10-CM | POA: Diagnosis not present

## 2019-06-22 DIAGNOSIS — Z0100 Encounter for examination of eyes and vision without abnormal findings: Secondary | ICD-10-CM | POA: Diagnosis not present

## 2019-07-04 ENCOUNTER — Other Ambulatory Visit: Payer: Self-pay | Admitting: *Deleted

## 2019-07-04 DIAGNOSIS — I10 Essential (primary) hypertension: Secondary | ICD-10-CM

## 2019-07-04 MED ORDER — AMLODIPINE BESYLATE 2.5 MG PO TABS: 1.2500 mg | ORAL_TABLET | Freq: Every day | ORAL | 1 refills | Status: DC

## 2019-07-04 MED ORDER — CITALOPRAM HYDROBROMIDE 20 MG PO TABS: 20.0000 mg | ORAL_TABLET | Freq: Every day | ORAL | 1 refills | Status: DC

## 2019-07-04 MED ORDER — MONTELUKAST SODIUM 10 MG PO TABS: ORAL_TABLET | ORAL | 3 refills | Status: DC

## 2019-07-04 MED ORDER — TRIAMTERENE-HCTZ 75-50 MG PO TABS: 1.0000 | ORAL_TABLET | Freq: Every day | ORAL | 1 refills | Status: DC

## 2019-07-15 ENCOUNTER — Ambulatory Visit: Payer: Medicare HMO | Attending: Internal Medicine

## 2019-07-15 DIAGNOSIS — Z23 Encounter for immunization: Secondary | ICD-10-CM | POA: Insufficient documentation

## 2019-07-15 NOTE — Progress Notes (Signed)
   Covid-19 Vaccination Clinic  Name:  [name]    MRN: [id number] DOB: 12/19/1950  07/15/2019  [name] was observed post Covid-19 immunization for 30 minutes based on pre-vaccination screening without incidence. She was provided with Vaccine Information Sheet and instruction to access the V-Safe system.   [name] was instructed to call 911 with any severe reactions post vaccine: Marland Kitchen Difficulty breathing  . Swelling of your face and throat  . A fast heartbeat  . A bad rash all over your body  . Dizziness and weakness    Immunizations Administered    Name Date Dose VIS Date Route   Pfizer COVID-19 Vaccine 07/15/2019 11:26 AM 0.3 mL 04/29/2019 Intramuscular   Manufacturer: [place]   Lot: [id number]   [place]: [id number]

## 2019-07-28 ENCOUNTER — Encounter: Payer: Self-pay | Admitting: Family Medicine

## 2019-08-01 DIAGNOSIS — H16141 Punctate keratitis, right eye: Secondary | ICD-10-CM | POA: Diagnosis not present

## 2019-08-09 ENCOUNTER — Ambulatory Visit: Payer: Medicare HMO | Attending: Internal Medicine

## 2019-08-09 DIAGNOSIS — Z23 Encounter for immunization: Secondary | ICD-10-CM

## 2019-08-09 NOTE — Progress Notes (Signed)
   Covid-19 Vaccination Clinic  Name:  NEFTALI MAGNIN    MRN: [id number] DOB: 1950/08/22  08/09/2019  Ms. Varkey was observed post Covid-19 immunization for 30 minutes based on pre-vaccination screening without incident. She was provided with Vaccine Information Sheet and instruction to access the V-Safe system.   Ms. Houseman was instructed to call 911 with any severe reactions post vaccine: Marland Kitchen Difficulty breathing  . Swelling of face and throat  . A fast heartbeat  . A bad rash all over body  . Dizziness and weakness   Immunizations Administered    Name Date Dose VIS Date Route   Pfizer COVID-19 Vaccine 08/09/2019  1:42 PM 0.3 mL 04/29/2019 Intramuscular   Manufacturer: [place]   Lot: [id number]   [place]: [id number]

## 2019-09-22 DIAGNOSIS — L820 Inflamed seborrheic keratosis: Secondary | ICD-10-CM | POA: Diagnosis not present

## 2019-09-22 DIAGNOSIS — D1801 Hemangioma of skin and subcutaneous tissue: Secondary | ICD-10-CM | POA: Diagnosis not present

## 2019-09-22 DIAGNOSIS — L814 Other melanin hyperpigmentation: Secondary | ICD-10-CM | POA: Diagnosis not present

## 2019-09-22 DIAGNOSIS — D225 Melanocytic nevi of trunk: Secondary | ICD-10-CM | POA: Diagnosis not present

## 2019-09-22 DIAGNOSIS — L570 Actinic keratosis: Secondary | ICD-10-CM | POA: Diagnosis not present

## 2019-10-07 ENCOUNTER — Encounter: Payer: Self-pay | Admitting: Family Medicine

## 2019-10-07 ENCOUNTER — Other Ambulatory Visit: Payer: Self-pay

## 2019-10-07 ENCOUNTER — Ambulatory Visit (INDEPENDENT_AMBULATORY_CARE_PROVIDER_SITE_OTHER): Payer: Medicare HMO | Admitting: Family Medicine

## 2019-10-07 VITALS — BP 155/73 | HR 68 | Ht 62.0 in | Wt 151.0 lb

## 2019-10-07 DIAGNOSIS — L739 Follicular disorder, unspecified: Secondary | ICD-10-CM | POA: Diagnosis not present

## 2019-10-07 MED ORDER — MUPIROCIN 2 % EX OINT: TOPICAL_OINTMENT | CUTANEOUS | 0 refills | Status: AC

## 2019-10-07 NOTE — Progress Notes (Signed)
Acute Office Visit  Subjective:    Patient ID: [name], female    DOB: 1950-08-19, 69 y.o.   MRN: [id number]  No chief complaint on file.   HPI Patient is in today for lesion in her nose. She was using a magnifying mirror to trim some nose hair and notices some pustules.  She noticed that there was a little bit of irritation in the nose but no pain.  She has been having some seasonal allergies so actually started taking oral Claritin about 3 weeks ago so she has been blowing her nose a little bit more frequently.  But she is never had problems or sores etc. in her nose before.  Past Medical History:  Diagnosis Date  . Asthma   . Breast cancer ([place])   . HSV (herpes simplex virus) anogenital infection   . Hyperlipidemia   . Hypertension   . Meniere's disease   . Migraine with visual aura    optical  . Postmenopausal   . Volvulus ([place])     Past Surgical History:  Procedure Laterality Date  . COLON SURGERY  2003   resection/small bowel ischemia  . ESOPHAGEAL DILATION    . HERNIA REPAIR    . LTCS    . OVARIAN CYST REMOVAL    . SMALL INTESTINE SURGERY  02/2010  . TYMPANOSTOMY TUBE PLACEMENT     placement RT, out now.     Family History  Problem Relation Age of Onset  . Hypertension Mother   . Hypertension Father   . Depression Father   . Glaucoma Father   . Parkinsonism Father   . Depression Sister   . Depression Sister     Social History   Socioeconomic History  . Marital status: Married    Spouse name: Louie Casa  . Number of children: 1  . Years of education: 32  . Highest education level: Some college, no degree  Occupational History  . 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 Smoking status: Never Smoker  . Smokeless tobacco: Never Used  Substance and Sexual Activity  . Alcohol use: Yes    Alcohol/week: 3.0 standard drinks    Types: 3 Glasses of wine per week    Comment: per week  . Drug use: No  . Sexual  activity: Yes    Partners: Male  Other Topics Concern  . Not on file  Social History Narrative   Read paper with coffee. Catches up on emails. Walks. Volunteers at her church and  [employer].  Gets together with grand children.     Social Determinants of Health   Financial Resource Strain:   . Difficulty of Paying Living Expenses:   Food Insecurity:   . Worried About Charity fundraiser in the Last Year:   . Arboriculturist in the Last Year:   Transportation Needs:   . Film/video editor (Medical):   Marland Kitchen Lack of Transportation (Non-Medical):   Physical Activity:   . Days of Exercise per Week:   . Minutes of Exercise per Session:   Stress:   . Feeling of Stress :   Social Connections:   . Frequency of Communication with Friends and Family:   . Frequency of Social Gatherings with Friends and Family:   . Attends Religious Services:   . Active Member of Clubs or Organizations:   . Attends Archivist Meetings:   Marland Kitchen Marital Status:   Intimate  Partner Violence:   . Fear of Current or Ex-Partner:   . Emotionally Abused:   Marland Kitchen Physically Abused:   . Sexually Abused:     Outpatient Medications Prior to Visit  Medication Sig Dispense Refill  . albuterol (VENTOLIN HFA) 108 (90 Base) MCG/ACT inhaler TAKE 2 PUFFS BY MOUTH EVERY 6 HOURS AS NEEDED 18 g PRN  . amLODipine (NORVASC) 2.5 MG tablet Take 0.5 tablets (1.25 mg total) by mouth daily. 45 tablet 1  . Cholecalciferol 1000 units CHEW Chew by mouth.    . citalopram (CELEXA) 20 MG tablet Take 1 tablet (20 mg total) by mouth daily. 90 tablet 1  . montelukast (SINGULAIR) 10 MG tablet TAKE 1 TABLET (10 MG TOTAL) BY MOUTH AT BEDTIME. 90 tablet 3  . SUMAtriptan (IMITREX) 100 MG tablet Take 1 tablet (100 mg total) by mouth every 2 (two) hours as needed. 10 tablet 1  . triamterene-hydrochlorothiazide (MAXZIDE) 75-50 MG tablet Take 1 tablet by mouth daily. 90 tablet 1  . Diclofenac Sodium 2 % SOLN Place 2 sprays onto the skin 2  (two) times daily. 2 packet 0   No facility-administered medications prior to visit.    Allergies  Allergen Reactions  . Codeine     REACTION: hyper  . Lisinopril Cough    Other reaction(s): Cough  . Losartan Other (See Comments)    Headaches  . Olmesartan Diarrhea    Review of Systems     Objective:    Physical Exam  BP (!) 155/73   Pulse 68   Ht 5\' 2"  (1.575 m)   Wt 151 lb (68.5 kg)   BMI 27.62 kg/m  Wt Readings from Last 3 Encounters:  10/07/19 151 lb (68.5 kg)  06/06/19 146 lb (66.2 kg)  02/15/19 150 lb (68 kg)    Health Maintenance Due  Topic Date Due  . PNA vac Low Risk Adult (2 of 2 - PPSV23) 06/16/2018    There are no preventive care reminders to display for this patient.   Lab Results  Component Value Date   TSH 2.12 07/16/2018   Lab Results  Component Value Date   WBC 6.3 10/21/2018   HGB 12.9 01/17/2019   HCT 29.1 (L) 10/21/2018   MCV 78.6 (L) 10/21/2018   PLT 458 (H) 10/21/2018   Lab Results  Component Value Date   NA 135 07/16/2018   K 4.4 07/16/2018   CO2 31 07/16/2018   GLUCOSE 92 07/16/2018   BUN 9 07/16/2018   CREATININE 0.62 07/16/2018   BILITOT 0.4 07/16/2018   ALKPHOS 105 01/12/2014   AST 17 07/16/2018   ALT 15 07/16/2018   PROT 6.3 07/16/2018   ALBUMIN 4.0 01/12/2014   CALCIUM 9.2 07/16/2018   Lab Results  Component Value Date   CHOL 264 (H) 07/16/2018   Lab Results  Component Value Date   HDL 109 07/16/2018   Lab Results  Component Value Date   LDLCALC 139 (H) 07/16/2018   Lab Results  Component Value Date   TRIG 68 07/16/2018   Lab Results  Component Value Date   CHOLHDL 2.4 07/16/2018   No results found for: HGBA1C     Assessment & Plan:   Problem List Items Addressed This Visit    None    Visit Diagnoses    Folliculitis    -  Primary     Exam confirmed a few clustered white papules/pustules right at the inside of the nose bilaterally.  Likely folliculitis from blowing her  nose a little bit  more frequently.  No open wounds or drainage.  No ulcerations.  Turbinates actually look pretty good they are not that swollen.  Discussed treatment with a topical mupirocin ointment.  Did discuss that folliculitis does usually resolve on its own.  If she develops any new or worsening symptoms then please let us know.  Meds ordered this encounter  Medications  . mupirocin ointment (BACTROBAN) 2 %    Sig: Apply to inside of each nares BID for 10 days    Dispense:  30 g    Refill:  0     Beatrice Lecher, MD

## 2019-10-18 ENCOUNTER — Encounter: Payer: Self-pay | Admitting: Family Medicine

## 2019-10-18 DIAGNOSIS — L739 Follicular disorder, unspecified: Secondary | ICD-10-CM

## 2019-10-18 DIAGNOSIS — J3489 Other specified disorders of nose and nasal sinuses: Secondary | ICD-10-CM

## 2019-11-02 DIAGNOSIS — J3489 Other specified disorders of nose and nasal sinuses: Secondary | ICD-10-CM | POA: Diagnosis not present

## 2019-11-09 ENCOUNTER — Other Ambulatory Visit: Payer: Self-pay | Admitting: Family Medicine

## 2019-11-09 DIAGNOSIS — I10 Essential (primary) hypertension: Secondary | ICD-10-CM

## 2020-01-12 ENCOUNTER — Encounter: Payer: Self-pay | Admitting: Family Medicine

## 2020-01-12 ENCOUNTER — Telehealth (INDEPENDENT_AMBULATORY_CARE_PROVIDER_SITE_OTHER): Payer: Medicare HMO | Admitting: Family Medicine

## 2020-01-12 DIAGNOSIS — Z20822 Contact with and (suspected) exposure to covid-19: Secondary | ICD-10-CM | POA: Diagnosis not present

## 2020-01-12 MED ORDER — PREDNISONE 20 MG PO TABS
20.0000 mg | ORAL_TABLET | Freq: Two times a day (BID) | ORAL | 0 refills | Status: AC
Start: 2020-01-12 — End: 2020-01-17

## 2020-01-12 NOTE — Progress Notes (Signed)
Symptoms since Tuesday:  Swollen, throbbing throat (has gotten a little better) Head congestion Chest tightness. Not coughing up any mucus Unable to lay flat No fever.  Eyes constantly watering Severe headache last night (resolved)  Biggest concern is the chest congestion.

## 2020-01-12 NOTE — Progress Notes (Signed)
[name] - 69 y.o. female MRN [id number]  Date of birth: [birth year]   This visit type was conducted due to national recommendations for restrictions regarding the COVID-19 Pandemic (e.g. social distancing).  This format is felt to be most appropriate for this patient at this time.  All issues noted in this document were discussed and addressed.  No physical exam was performed (except for noted visual exam findings with Video Visits).  I discussed the limitations of evaluation and management by telemedicine and the availability of in person appointments. The patient expressed understanding and agreed to proceed.  I connected with@ on 01/12/20 at  2:00 PM EDT by a video enabled telemedicine application and verified that I am speaking with the correct person using two identifiers.  Present at visit: Luetta Nutting, DO Cherlynn Polo   Patient Location: Home [place] [address]   Provider location:   [place] Complaint  Patient presents with  . Sinus Problem    HPI  [name] is a 69 y.o. female who presents via audio/video conferencing for a telehealth visit today.  She has complaint of sore throat, headache, cough, head and chest congestion, chest tightness.  She has history of asthma and has felt a little short of breath.  She does get some relief with albuterol.  She doesn't really feel like she is wheezing more.  She denies fever or chills.  She has had COVID vaccine.  She does have a COVID test scheduled for tomorrow but isn't sure if she needs this.  She is eating and drinking normally.    ROS:  A comprehensive ROS was completed and negative except as noted per HPI  Past Medical History:  Diagnosis Date  . Asthma   . Breast cancer ([place])   . HSV (herpes simplex virus) anogenital infection   . Hyperlipidemia   . Hypertension   . Meniere's disease   . Migraine with visual aura    optical  . Postmenopausal   . Volvulus  ([place])     Past Surgical History:  Procedure Laterality Date  . COLON SURGERY  2003   resection/small bowel ischemia  . ESOPHAGEAL DILATION    . HERNIA REPAIR    . LTCS    . OVARIAN CYST REMOVAL    . SMALL INTESTINE SURGERY  02/2010  . TYMPANOSTOMY TUBE PLACEMENT     placement RT, out now.     Family History  Problem Relation Age of Onset  . Hypertension Mother   . Hypertension Father   . Depression Father   . Glaucoma Father   . Parkinsonism Father   . Depression Sister   . Depression Sister     Social History   Socioeconomic History  . Marital status: Married    Spouse name: Louie Casa  . Number of children: 1  . Years of education: 91  . Highest education level: Some college, no degree  Occupational History  . 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 Smoking status: Never Smoker  . Smokeless tobacco: Never Used  Vaping Use  . Vaping Use: Never used  Substance and Sexual Activity  . Alcohol use: Yes    Alcohol/week: 3.0 standard drinks    Types: 3 Glasses of wine per week    Comment: per week  . Drug use: No  . Sexual activity: Yes    Partners: Male  Other Topics Concern  . Not  on file  Social History Narrative   Read paper with coffee. Catches up on emails. Walks. Volunteers at her church and  [employer].  Gets together with grand children.     Social Determinants of Health   Financial Resource Strain:   . Difficulty of Paying Living Expenses: Not on file  Food Insecurity:   . Worried About Charity fundraiser in the Last Year: Not on file  . Ran Out of Food in the Last Year: Not on file  Transportation Needs:   . Lack of Transportation (Medical): Not on file  . Lack of Transportation (Non-Medical): Not on file  Physical Activity:   . Days of Exercise per Week: Not on file  . Minutes of Exercise per Session: Not on file  Stress:   . Feeling of Stress : Not on file  Social Connections:   . Frequency of Communication  with Friends and Family: Not on file  . Frequency of Social Gatherings with Friends and Family: Not on file  . Attends Religious Services: Not on file  . Active Member of Clubs or Organizations: Not on file  . Attends Archivist Meetings: Not on file  . Marital Status: Not on file  Intimate Partner Violence:   . Fear of Current or Ex-Partner: Not on file  . Emotionally Abused: Not on file  . Physically Abused: Not on file  . Sexually Abused: Not on file     Current Outpatient Medications:  .  albuterol (VENTOLIN HFA) 108 (90 Base) MCG/ACT inhaler, TAKE 2 PUFFS BY MOUTH EVERY 6 HOURS AS NEEDED, Disp: 18 g, Rfl: PRN .  amLODipine (NORVASC) 2.5 MG tablet, TAKE 1/2 TABLET EVERY DAY, Disp: 45 tablet, Rfl: 1 .  Cholecalciferol 1000 units CHEW, Chew by mouth., Disp: , Rfl:  .  citalopram (CELEXA) 20 MG tablet, TAKE 1 TABLET EVERY DAY, Disp: 90 tablet, Rfl: 1 .  montelukast (SINGULAIR) 10 MG tablet, TAKE 1 TABLET (10 MG TOTAL) BY MOUTH AT BEDTIME., Disp: 90 tablet, Rfl: 3 .  mupirocin ointment (BACTROBAN) 2 %, Apply to inside of each nares BID for 10 days, Disp: 30 g, Rfl: 0 .  SUMAtriptan (IMITREX) 100 MG tablet, Take 1 tablet (100 mg total) by mouth every 2 (two) hours as needed., Disp: 10 tablet, Rfl: 1 .  triamterene-hydrochlorothiazide (MAXZIDE) 75-50 MG tablet, TAKE 1 TABLET EVERY DAY, Disp: 90 tablet, Rfl: 1 .  predniSONE (DELTASONE) 20 MG tablet, Take 1 tablet (20 mg total) by mouth 2 (two) times daily with a meal for 5 days., Disp: 10 tablet, Rfl: 0  EXAM:  VITALS per patient if applicable: Wt 149 lb (67.6 kg)   BMI 27.25 kg/m   GENERAL: alert, oriented, appears well and in no acute distress  HEENT: atraumatic, conjunttiva clear, no obvious abnormalities on inspection of external nose and ears  NECK: normal movements of the head and neck  LUNGS: on inspection no signs of respiratory distress, breathing rate appears normal, no obvious gross SOB, gasping or  wheezing  CV: no obvious cyanosis  MS: moves all visible extremities without noticeable abnormality  PSYCH/NEURO: pleasant and cooperative, no obvious depression or anxiety, speech and thought processing grossly intact  ASSESSMENT AND PLAN:  Discussed the following assessment and plan:  Suspected COVID-19 virus infection Symptoms are suspicious for COVID-19.  Recommend that she keep appt for testing.   Continue supportive and symptomatic care at home.   Will add course of prednisone with history of asthma and chest  tightness.  She will keep [place] updated on testing results and if she has any worsening of symptoms.       I discussed the assessment and treatment plan with the patient. The patient was provided an opportunity to ask questions and all were answered. The patient agreed with the plan and demonstrated an understanding of the instructions.   The patient was advised to call back or seek an in-person evaluation if the symptoms worsen or if the condition fails to improve as anticipated.    Luetta Nutting, DO

## 2020-01-12 NOTE — Assessment & Plan Note (Signed)
Symptoms are suspicious for COVID-19.  Recommend that she keep appt for testing.   Continue supportive and symptomatic care at home.   Will add course of prednisone with history of asthma and chest tightness.  She will keep [place] updated on testing results and if she has any worsening of symptoms.

## 2020-01-13 DIAGNOSIS — Z20828 Contact with and (suspected) exposure to other viral communicable diseases: Secondary | ICD-10-CM | POA: Diagnosis not present

## 2020-01-16 ENCOUNTER — Encounter: Payer: Self-pay | Admitting: Family Medicine

## 2020-01-19 ENCOUNTER — Telehealth (INDEPENDENT_AMBULATORY_CARE_PROVIDER_SITE_OTHER): Payer: Medicare HMO | Admitting: Nurse Practitioner

## 2020-01-19 ENCOUNTER — Encounter: Payer: Self-pay | Admitting: Nurse Practitioner

## 2020-01-19 DIAGNOSIS — J019 Acute sinusitis, unspecified: Secondary | ICD-10-CM | POA: Diagnosis not present

## 2020-01-19 MED ORDER — AMOXICILLIN-POT CLAVULANATE 875-125 MG PO TABS: 1.0000 | ORAL_TABLET | Freq: Two times a day (BID) | ORAL | 0 refills | Status: DC

## 2020-01-19 MED ORDER — FLUCONAZOLE 150 MG PO TABS: 150.0000 mg | ORAL_TABLET | Freq: Once | ORAL | 0 refills | Status: AC

## 2020-01-19 NOTE — Patient Instructions (Signed)
The following information is provided as a general resource for ADULT patients only and does NOT take into account PREGNANCY, ALLERGIES, LIVER CONDITIONS, KIDNEY CONDITIONS, GASTROINTESTINAL CONDITIONS, OR PRESCRIPTION MEDICATION INTERACTIONS. Please be sure to ask your provider if the following are safe to take with your specific medical history, conditions, or current medication regimen if you are unsure.   Adult Basic Symptom Management for Sinusitis  Congestion: Guaifenesin (Mucinex)- follow directions on packaging with a maximum dose of 2400mg in a 24 hour period.  Pain/Fever: Ibuprofen 200mg - 400mg every 4-6 hours as needed (MAX 1200mg in a 24 hour period) Pain/Fever: Tylenol 500mg -1000mg every 6-8 hours as needed (MAX 3000mg in a 24 hour period)  Cough: Dextromethorphan (Delsym)- follow directions on packing with a maximum dose of 120mg in a 24 hour period.  Nasal Stuffiness: Saline nasal spray and/or Nettie Pot with sterile saline solution  Runny Nose: Fluticasone nasal spray (Flonase) OR Mometasone nasal spray (Nasonex) OR Triamcinolone Acetonide nasal spray (Nasacort)- follow directions on the packaging  Pain/Pressure: Warm washcloth to the face  Sore Throat: Warm salt water gargles  If you have allergies, you may also consider taking an oral antihistamine (like Zyrtec or Claritin) as these may also help with your symptoms.  **Many medications will have more than one ingredient, be sure you are reading the packaging carefully and not taking more than one dose of the same kind of medication at the same time or too close together. It is OK to use formulas that have all of the ingredients you want, but do not take them in a combined medication and as separate dose too close together. If you have any questions, the pharmacist will be happy to help you decide what is safe.    

## 2020-01-19 NOTE — Progress Notes (Signed)
Virtual Video Visit via MyChart Note  I connected with  [name] on 01/19/20 at  2:50 PM EDT by the video enabled telemedicine application for , MyChart, and verified that I am speaking with the correct person using two identifiers.   I introduced myself as a Designer, jewellery with the practice. We discussed the limitations of evaluation and management by telemedicine and the availability of in person appointments. The patient expressed understanding and agreed to proceed.  The patient is: At home  I am: In the office  Subjective:    CC: Cough and congestion  HPI: [name] is a very pleasant 69 y.o. y/o female presenting via [place] today for ongoing cough and congestion for the past 10 days.  She had a visit with Dr. Zigmund Daniel in this office on 01/12/2020 for the same problem.  She was prescribed prednisone and it was recommended that she have Covid testing.  Her Covid testing was negative and she is finished the prednisone however she is not doing significantly better.  She does report her sore throat has improved however she still is experiencing a cough with yellow mucus production, sinus pressure, and chest congestion.  She reports that this time the cough seems to be the worst of her symptoms and is making it difficult to sleep at night.  She denies fever, chills, ear pain or pressure, nausea, vomiting, diarrhea.  Past medical history, Surgical history, Family history not pertinant except as noted below, Social history, Allergies, and medications have been entered into the medical record, reviewed, and corrections made.   Review of Systems:  See HPI for pertinent positive and negatives  Objective:    General: Speaking clearly in complete sentences without any shortness of breath.   Alert and oriented x3.   Normal judgment.  No apparent acute distress. She is audibly congested with a wet cough.    Impression and Recommendations:    1. Acute non-recurrent  sinusitis, unspecified location Symptoms and presentation consistent with acute nonrecurrent sinusitis.  Given the length of time her symptoms have been present in the negative Covid test we will go ahead and start antibiotics. Recommend continuing NyQuil cough syrup for nighttime cough.  May utilize DayQuil or Delsym for daytime cough.  Also recommend considering Mucinex to help with excess mucus production. Prescription sent to pharmacy for Augmentin twice a day for 5 days.  Prescription also supplied for Diflucan for yeast infection which patient tends to get with antibiotic use.  Recommend not to start Diflucan unless symptoms are present. - amoxicillin-clavulanate (AUGMENTIN) 875-125 MG tablet; Take 1 tablet by mouth 2 (two) times daily.  Dispense: 10 tablet; Refill: 0 - fluconazole (DIFLUCAN) 150 MG tablet; Take 1 tablet (150 mg total) by mouth once for 1 dose.  Dispense: 1 tablet; Refill: 0  Follow-up if symptoms worsen or fail to improve.   I discussed the assessment and treatment plan with the patient. The patient was provided an opportunity to ask questions and all were answered. The patient agreed with the plan and demonstrated an understanding of the instructions.   The patient was advised to call back or seek an in-person evaluation if the symptoms worsen or if the condition fails to improve as anticipated.  I provided 20 minutes of non-face-to-face interaction with this [place] visit including intake, same-day documentation, and chart review.   Orma Render, NP

## 2020-01-25 ENCOUNTER — Encounter: Payer: Self-pay | Admitting: Family Medicine

## 2020-01-25 MED ORDER — TRIAMTERENE-HCTZ 37.5-25 MG PO TABS
1.0000 | ORAL_TABLET | Freq: Every day | ORAL | 0 refills | Status: DC
Start: 2020-01-25 — End: 2020-02-01

## 2020-02-01 ENCOUNTER — Encounter: Payer: Self-pay | Admitting: Family Medicine

## 2020-02-01 ENCOUNTER — Other Ambulatory Visit: Payer: Self-pay | Admitting: *Deleted

## 2020-02-01 MED ORDER — TRIAMTERENE-HCTZ 37.5-25 MG PO TABS: 1.0000 | ORAL_TABLET | Freq: Every day | ORAL | 0 refills | Status: DC

## 2020-02-24 MED ORDER — TRIAMTERENE-HCTZ 75-50 MG PO TABS: 1.0000 | ORAL_TABLET | Freq: Every day | ORAL | 3 refills | Status: AC

## 2020-02-24 NOTE — Telephone Encounter (Signed)
Med sent to [facility]

## 2020-03-26 ENCOUNTER — Other Ambulatory Visit: Payer: Self-pay | Admitting: Family Medicine

## 2020-03-26 DIAGNOSIS — I10 Essential (primary) hypertension: Secondary | ICD-10-CM

## 2020-03-29 ENCOUNTER — Other Ambulatory Visit: Payer: Self-pay | Admitting: Family Medicine

## 2020-04-04 ENCOUNTER — Encounter: Payer: Self-pay | Admitting: Family Medicine

## 2020-04-04 DIAGNOSIS — I10 Essential (primary) hypertension: Secondary | ICD-10-CM

## 2020-04-04 MED ORDER — AMLODIPINE BESYLATE 2.5 MG PO TABS: 2.5000 mg | ORAL_TABLET | Freq: Every day | ORAL | 1 refills | Status: DC

## 2020-04-05 ENCOUNTER — Ambulatory Visit (INDEPENDENT_AMBULATORY_CARE_PROVIDER_SITE_OTHER): Payer: Medicare HMO | Admitting: Sports Medicine

## 2020-04-05 ENCOUNTER — Ambulatory Visit (INDEPENDENT_AMBULATORY_CARE_PROVIDER_SITE_OTHER): Payer: Medicare HMO

## 2020-04-05 ENCOUNTER — Other Ambulatory Visit: Payer: Self-pay

## 2020-04-05 DIAGNOSIS — M5136 Other intervertebral disc degeneration, lumbar region: Secondary | ICD-10-CM | POA: Diagnosis not present

## 2020-04-05 DIAGNOSIS — K6389 Other specified diseases of intestine: Secondary | ICD-10-CM | POA: Diagnosis not present

## 2020-04-05 DIAGNOSIS — M545 Low back pain, unspecified: Secondary | ICD-10-CM | POA: Diagnosis not present

## 2020-04-05 DIAGNOSIS — M25551 Pain in right hip: Secondary | ICD-10-CM | POA: Diagnosis not present

## 2020-04-05 MED ORDER — PREDNISONE 50 MG PO TABS: ORAL_TABLET | ORAL | 0 refills | Status: DC

## 2020-04-05 NOTE — Progress Notes (Signed)
    Procedures performed today:    None.  Independent interpretation of notes and tests performed by another provider:   None.  Brief History, Exam, Impression, and Recommendations:    Acute right-sided low back pain without sciatica This pleasant 69 year old female with a history of lumbar DDD took a misstep, she immediate pain in her right hip and quadratus lumborum, she then noticed some bruising. Over time this improved but she still has on and off symptoms and the location of the quadratus lumborum at the insertion on the iliac crest. Exam is for the most part unremarkable. Adding 5 days of prednisone, x-rays of her lumbar spine and old right hip, I do think she either has a quadratus lumborum strain or this is referred pain again from her lumbar spine. Return to see me in 4 to 6 weeks, MRI if no better.    ___________________________________________ Gwen Her. Dianah Field, M.D., ABFM., CAQSM. Primary Care and [place] Instructor of [employer] [facility] [employer] [employer]

## 2020-04-05 NOTE — Assessment & Plan Note (Signed)
This pleasant 69 year old female with a history of lumbar DDD took a misstep, she immediate pain in her right hip and quadratus lumborum, she then noticed some bruising. Over time this improved but she still has on and off symptoms and the location of the quadratus lumborum at the insertion on the iliac crest. Exam is for the most part unremarkable. Adding 5 days of prednisone, x-rays of her lumbar spine and old right hip, I do think she either has a quadratus lumborum strain or this is referred pain again from her lumbar spine. Return to see me in 4 to 6 weeks, MRI if no better.

## 2020-04-19 ENCOUNTER — Other Ambulatory Visit: Payer: Self-pay | Admitting: Family Medicine

## 2020-04-21 ENCOUNTER — Other Ambulatory Visit: Payer: Self-pay | Admitting: Family Medicine

## 2020-05-03 ENCOUNTER — Ambulatory Visit: Payer: Medicare HMO | Admitting: Sports Medicine

## 2020-06-15 ENCOUNTER — Other Ambulatory Visit: Payer: Self-pay

## 2020-06-15 ENCOUNTER — Encounter: Payer: Self-pay | Admitting: Nurse Practitioner

## 2020-06-15 ENCOUNTER — Ambulatory Visit (INDEPENDENT_AMBULATORY_CARE_PROVIDER_SITE_OTHER): Payer: Medicare HMO | Admitting: Nurse Practitioner

## 2020-06-15 VITALS — BP 147/79 | HR 70 | Wt 149.1 lb

## 2020-06-15 DIAGNOSIS — D509 Iron deficiency anemia, unspecified: Secondary | ICD-10-CM

## 2020-06-15 DIAGNOSIS — R197 Diarrhea, unspecified: Secondary | ICD-10-CM | POA: Diagnosis not present

## 2020-06-15 DIAGNOSIS — E559 Vitamin D deficiency, unspecified: Secondary | ICD-10-CM

## 2020-06-15 DIAGNOSIS — R140 Abdominal distension (gaseous): Secondary | ICD-10-CM | POA: Diagnosis not present

## 2020-06-15 DIAGNOSIS — R82998 Other abnormal findings in urine: Secondary | ICD-10-CM

## 2020-06-15 DIAGNOSIS — Z0000 Encounter for general adult medical examination without abnormal findings: Secondary | ICD-10-CM | POA: Diagnosis not present

## 2020-06-15 DIAGNOSIS — R195 Other fecal abnormalities: Secondary | ICD-10-CM

## 2020-06-15 DIAGNOSIS — Z1159 Encounter for screening for other viral diseases: Secondary | ICD-10-CM | POA: Diagnosis not present

## 2020-06-15 DIAGNOSIS — E785 Hyperlipidemia, unspecified: Secondary | ICD-10-CM

## 2020-06-15 DIAGNOSIS — I10 Essential (primary) hypertension: Secondary | ICD-10-CM | POA: Diagnosis not present

## 2020-06-15 DIAGNOSIS — Z9189 Other specified personal risk factors, not elsewhere classified: Secondary | ICD-10-CM | POA: Diagnosis not present

## 2020-06-15 NOTE — Patient Instructions (Signed)
Chronic Diarrhea Chronic diarrhea is a condition in which a person passes frequent loose and watery stools for 4 weeks or longer. Non-chronic diarrhea usually lasts for only 2-3 days. Diarrhea can cause a person to feel weak and dehydrated. Dehydration can make the person tired and thirsty. It can also cause a dry mouth, decreased urination, and dark yellow urine. Diarrhea is a sign of an underlying problem, such as:  Infection.  Side effects of medicines.  Problems digesting something in your diet, such as milk products if you have lactose intolerance.  Conditions such as celiac disease, irritable bowel syndrome (IBS), or inflammatory bowel disease (IBD). If you have chronic diarrhea, make sure you treat it as told by your health care provider. Follow these instructions at home: Medicines  Take over-the-counter and prescription medicines only as told by your health care provider.  If you were prescribed an antibiotic medicine, take it as told by your health care provider. Do not stop taking the antibiotic even if you start to feel better. Eating and drinking  Follow instructions from your health care provider about what to eat and drink. You may have to: ? Avoid foods that trigger diarrhea for you. ? Take an oral rehydration solution (ORS). This is a drink that keeps you hydrated. It can be found at pharmacies and retail stores. ? Drink clear fluids, such as water, diluted fruit juice, and low-calorie sports drinks. You can also get fluids by sucking on ice chips. ? Drink enough fluid to keep your urine pale yellow. This will help you avoid dehydration. ? Eat small amounts of bland foods that are easy to digest as you are able. These foods include bananas, applesauce, rice, lean meats, toast, and crackers. ? Avoid spicy or fatty foods. ? Avoid foods and beverages that contain a lot of sugar or caffeine.  Do not drink alcohol if: ? Your health care provider tells you not to  drink. ? You are pregnant, may be pregnant, or are planning to become pregnant.  If you drink alcohol: ? Limit how much you use to:  0-1 drink a day for women.  0-2 drinks a day for men. ? Be aware of how much alcohol is in your drink. In the [place] one drink equals one 12 oz bottle of beer (355 mL), one 5 oz glass of wine (148 mL), or one 1 oz glass of hard liquor (44 mL).   General instructions  Wash your hands often and after each diarrhea episode. Use soap and water. If soap and water are not available, use hand sanitizer.  Make sure that all people in your household wash their hands well and often.  Rest as told by your health care provider.  Watch your condition for any changes.  Take a warm bath to relieve any burning or pain from frequent diarrhea episodes.  Keep all follow-up visits as told by your health care provider. This is important.   Contact a health care provider if:  You have a fever.  Your diarrhea gets worse or does not get better.  You have new symptoms.  You cannot drink fluid without vomiting.  You feel light-headed or dizzy.  You have a headache.  You have muscle cramps.  You have severe pain in the rectum. Get help right away if:  You have vomiting that does not go away.  You have chest pain.  You feel very weak or you faint.  You have bloody or black stools, or stools that look   like tar.  You have severe pain, cramping, or bloating in your abdomen, or pain that stays in one place.  You have trouble breathing or you are breathing very quickly.  Your heart is beating very quickly.  Your skin feels cold and clammy.  You feel confused.  You have a severe headache.  You have signs or symptoms of dehydration, such as: ? Dark urine, very little urine, or no urine. ? Cracked lips. ? Dry mouth. ? Sunken eyes. ? Sleepiness. ? Weakness. These symptoms may represent a serious problem that is an emergency. Do not wait to see if the  symptoms will go away. Get medical help right away. Call your local emergency services (911 in the [place] Do not drive yourself to the hospital. Summary  Chronic diarrhea is a condition in which a person passes frequent loose and watery stools for 4 weeks or longer.  Diarrhea is a sign of an underlying problem.  Make sure you treat your diarrhea as told by your health care provider.  Drink enough fluid to keep your urine pale yellow. This will help you avoid dehydration.  Wash your hands often and after each diarrhea episode. If soap and water are not available, use hand sanitizer. This information is not intended to replace advice given to you by your health care provider. Make sure you discuss any questions you have with your health care provider. Document Revised: 11/01/2018 Document Reviewed: 11/01/2018 Elsevier Patient Education  2021 [employer]  

## 2020-06-15 NOTE — Progress Notes (Signed)
Acute Office Visit  Subjective:    Patient ID: [name], female    DOB: [birth year], 70 y.o.   MRN: [id number]  Chief Complaint  Patient presents with  . Abdominal Pain    Diarrhea, bloating, gas, reflux     HPI Patient is in today for abdominal pain, pressure, bloating, excess gas, pale stools, dark urine, and soft stools that have been present for about the past two weeks.  She reports about 2 weeks ago she fixed several meals with fresh Kale for about 4-5 days. Shortly after after, she noticed a change in her stools and significant bloating and abdominal pain. She reports that she washed the kale thoroughly and no one else in the household has had any similar symptoms. She reports that since the onset she has consistently experienced abdominal cramping and frequent bowel movements with the symptoms listed above.   She has tried to monitor the foods and cannot correlate her symptoms worsening with any specific food.   She denies fever, chills, blood diarrhea, mucous in her stool, or vomiting. She also denies changes to her skin coloration or itching of the skin.    She has also experienced reflux symptoms, intermittent nausea, and belching.  She has had her gallbladder removed. She has not had recent antibiotics or recent travel.   Past Medical History:  Diagnosis Date  . Asthma   . Breast cancer ([place])   . HSV (herpes simplex virus) anogenital infection   . Hyperlipidemia   . Hypertension   . Meniere's disease   . Migraine with visual aura    optical  . Postmenopausal   . Volvulus ([place])     Past Surgical History:  Procedure Laterality Date  . COLON SURGERY  2003   resection/small bowel ischemia  . ESOPHAGEAL DILATION    . HERNIA REPAIR    . LTCS    . OVARIAN CYST REMOVAL    . SMALL INTESTINE SURGERY  02/2010  . TYMPANOSTOMY TUBE PLACEMENT     placement RT, out now.     Family History  Problem Relation Age of Onset  . Hypertension Mother   .  Hypertension Father   . Depression Father   . Glaucoma Father   . Parkinsonism Father   . Depression Sister   . Depression Sister     Social History   Socioeconomic History  . Marital status: Married    Spouse name: Louie Casa  . Number of children: 1  . Years of education: 19  . Highest education level: Some college, no degree  Occupational History  . 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 Smoking status: Never Smoker  . Smokeless tobacco: Never Used  Vaping Use  . Vaping Use: Never used  Substance and Sexual Activity  . Alcohol use: Yes    Alcohol/week: 3.0 standard drinks    Types: 3 Glasses of wine per week    Comment: per week  . Drug use: No  . Sexual activity: Yes    Partners: Male  Other Topics Concern  . Not on file  Social History Narrative   Read paper with coffee. Catches up on emails. Walks. Volunteers at her church and  [employer].  Gets together with grand children.     Social Determinants of Health   Financial Resource Strain: Not on file  Food Insecurity: Not on file  Transportation Needs: Not on file  Physical Activity: Not on file  Stress: Not  on file  Social Connections: Not on file  Intimate Partner Violence: Not on file    Outpatient Medications Prior to Visit  Medication Sig Dispense Refill  . albuterol (VENTOLIN HFA) 108 (90 Base) MCG/ACT inhaler INHALE 2 PUFFS BY MOUTH EVERY 6 HOURS AS NEEDED 18 each 1  . amLODipine (NORVASC) 2.5 MG tablet Take 1 tablet (2.5 mg total) by mouth daily. 90 tablet 1  . Cholecalciferol 1000 units CHEW Chew by mouth.    . citalopram (CELEXA) 20 MG tablet TAKE 1 TABLET EVERY DAY 90 tablet 1  . montelukast (SINGULAIR) 10 MG tablet TAKE 1 TABLET (10 MG TOTAL) BY MOUTH AT BEDTIME. 90 tablet 3  . mupirocin ointment (BACTROBAN) 2 % Apply to inside of each nares BID for 10 days 30 g 0  . predniSONE (DELTASONE) 50 MG tablet One tab PO daily for 5 days. 5 tablet 0  . SUMAtriptan  (IMITREX) 100 MG tablet Take 1 tablet (100 mg total) by mouth every 2 (two) hours as needed. 10 tablet 1  . triamterene-hydrochlorothiazide (MAXZIDE) 75-50 MG tablet Take 1 tablet by mouth daily. 90 tablet 3   No facility-administered medications prior to visit.    Allergies  Allergen Reactions  . Codeine     REACTION: hyper  . Lisinopril Cough    Other reaction(s): Cough  . Losartan Other (See Comments)    Headaches  . Olmesartan Diarrhea    Review of Systems All review of systems negative except what is listed in the HPI     Objective:    Physical Exam Vitals and nursing note reviewed.  Constitutional:      Appearance: She is well-developed and normal weight.  HENT:     Head: Normocephalic.  Cardiovascular:     Rate and Rhythm: Normal rate and regular rhythm.     Heart sounds: Normal heart sounds.  Pulmonary:     Effort: Pulmonary effort is normal.     Breath sounds: Normal breath sounds.  Abdominal:     General: Abdomen is protuberant. Bowel sounds are increased. There is distension. There is no abdominal bruit.     Palpations: Abdomen is soft. There is no fluid wave, hepatomegaly, splenomegaly, mass or pulsatile mass.     Tenderness: There is no abdominal tenderness. There is no guarding or rebound. Negative signs include Murphy's sign and McBurney's sign.     Hernia: No hernia is present.  Skin:    General: Skin is warm and dry.     Capillary Refill: Capillary refill takes less than 2 seconds.  Neurological:     General: No focal deficit present.     Mental Status: She is alert.     BP (!) 147/79   Pulse 70   Wt 149 lb 1.6 oz (67.6 kg)   SpO2 99%   BMI 27.27 kg/m  Wt Readings from Last 3 Encounters:  06/15/20 149 lb 1.6 oz (67.6 kg)  01/12/20 149 lb (67.6 kg)  10/07/19 151 lb (68.5 kg)    There are no preventive care reminders to display for this patient.  There are no preventive care reminders to display for this patient.   Lab Results   Component Value Date   TSH 2.12 07/16/2018   Lab Results  Component Value Date   WBC 6.3 10/21/2018   HGB 12.9 01/17/2019   HCT 29.1 (L) 10/21/2018   MCV 78.6 (L) 10/21/2018   PLT 458 (H) 10/21/2018   Lab Results  Component Value Date  NA 135 07/16/2018   K 4.4 07/16/2018   CO2 31 07/16/2018   GLUCOSE 92 07/16/2018   BUN 9 07/16/2018   CREATININE 0.62 07/16/2018   BILITOT 0.4 07/16/2018   ALKPHOS 105 01/12/2014   AST 17 07/16/2018   ALT 15 07/16/2018   PROT 6.3 07/16/2018   ALBUMIN 4.0 01/12/2014   CALCIUM 9.2 07/16/2018   Lab Results  Component Value Date   CHOL 264 (H) 07/16/2018   Lab Results  Component Value Date   HDL 109 07/16/2018   Lab Results  Component Value Date   LDLCALC 139 (H) 07/16/2018   Lab Results  Component Value Date   TRIG 68 07/16/2018   Lab Results  Component Value Date   CHOLHDL 2.4 07/16/2018   No results found for: HGBA1C     Assessment & Plan:   1. Diarrhea, unspecified  2. Abdominal bloating 3. Pale stool 4. Dark urine - CBC With Differential - COMPLETE METABOLIC PANEL WITH GFR - Lipase - Salmonella/Shigella Cult, Campy EIA and Shiga Toxin reflex  Loose stools, abdominal pain, bloating, gas, pale stool, dark urine, and generalized feeling of malaise for approximately 2 weeks with no known etiology.  Physical exam unremarkable with the exception of hyperactive bowel sounds and abdominal distention. Differentials include possible infection- will test stool for common causes of diarrhea. Will also obtain labs to monitor liver, kidney, and pancreatic function as well as white blood counts.  Recommend continuing to monitor dietary habits to see if any food type seems to worsen symptoms.  Probiotic may be helpful if infectious cause is present.  May use imodium for symptom management as long as fever and blood are not present.  Recommend OTC acid reducer, such as Pepcid to see if this is helpful for symptoms.  Patient  declined anti-emetic today for nausea, but she will contact me if she would like to start this at a later time.  If liver enzymes are abnormal, consider hepatitis panel.  If labs are normal, consider GI referral for further evaluation-she does have a history of significant GI surgery.  Will plan follow-up based on lab results  Orma Render, NP

## 2020-06-15 NOTE — Progress Notes (Signed)
Labs ordered.

## 2020-06-16 ENCOUNTER — Encounter: Payer: Self-pay | Admitting: Nurse Practitioner

## 2020-06-16 LAB — CBC WITH DIFFERENTIAL/PLATELET
Absolute Monocytes: 713 cells/uL (ref 200–950)
Basophils Absolute: 73 cells/uL (ref 0–200)
Basophils Relative: 0.9 %
Eosinophils Absolute: 332 cells/uL (ref 15–500)
Eosinophils Relative: 4.1 %
HCT: 38.8 % (ref 35.0–45.0)
Hemoglobin: 13.3 g/dL (ref 11.7–15.5)
Lymphs Abs: 1466 cells/uL (ref 850–3900)
MCH: 30.4 pg (ref 27.0–33.0)
MCHC: 34.3 g/dL (ref 32.0–36.0)
MCV: 88.6 fL (ref 80.0–100.0)
MPV: 9.7 fL (ref 7.5–12.5)
Monocytes Relative: 8.8 %
Neutro Abs: 5516 cells/uL (ref 1500–7800)
Neutrophils Relative %: 68.1 %
Platelets: 389 10*3/uL (ref 140–400)
RBC: 4.38 10*6/uL (ref 3.80–5.10)
RDW: 11.8 % (ref 11.0–15.0)
Total Lymphocyte: 18.1 %
WBC: 8.1 10*3/uL (ref 3.8–10.8)

## 2020-06-16 LAB — COMPLETE METABOLIC PANEL WITH GFR
AG Ratio: 1.8 (calc) (ref 1.0–2.5)
ALT: 47 U/L — ABNORMAL HIGH (ref 6–29)
AST: 43 U/L — ABNORMAL HIGH (ref 10–35)
Albumin: 4.3 g/dL (ref 3.6–5.1)
Alkaline phosphatase (APISO): 161 U/L — ABNORMAL HIGH (ref 37–153)
BUN: 16 mg/dL (ref 7–25)
CO2: 25 mmol/L (ref 20–32)
Calcium: 9.3 mg/dL (ref 8.6–10.4)
Chloride: 94 mmol/L — ABNORMAL LOW (ref 98–110)
Creat: 0.9 mg/dL (ref 0.50–0.99)
GFR, Est African American: 76 mL/min/{1.73_m2} (ref >=60)
GFR, Est Non African American: 65 mL/min/{1.73_m2} (ref >=60)
Globulin: 2.4 g/dL (calc) (ref 1.9–3.7)
Glucose, Bld: 95 mg/dL (ref 65–139)
Potassium: 3.9 mmol/L (ref 3.5–5.3)
Sodium: 131 mmol/L — ABNORMAL LOW (ref 135–146)
Total Bilirubin: 1 mg/dL (ref 0.2–1.2)
Total Protein: 6.7 g/dL (ref 6.1–8.1)

## 2020-06-16 LAB — VITAMIN D 25 HYDROXY (VIT D DEFICIENCY, FRACTURES): Vit D, 25-Hydroxy: 30 ng/mL (ref 30–100)

## 2020-06-18 ENCOUNTER — Telehealth: Payer: Self-pay | Admitting: Nurse Practitioner

## 2020-06-18 ENCOUNTER — Encounter: Payer: Self-pay | Admitting: Nurse Practitioner

## 2020-06-18 DIAGNOSIS — Z0000 Encounter for general adult medical examination without abnormal findings: Secondary | ICD-10-CM | POA: Diagnosis not present

## 2020-06-18 DIAGNOSIS — R140 Abdominal distension (gaseous): Secondary | ICD-10-CM

## 2020-06-18 DIAGNOSIS — R1084 Generalized abdominal pain: Secondary | ICD-10-CM

## 2020-06-18 DIAGNOSIS — D509 Iron deficiency anemia, unspecified: Secondary | ICD-10-CM | POA: Diagnosis not present

## 2020-06-18 DIAGNOSIS — E559 Vitamin D deficiency, unspecified: Secondary | ICD-10-CM | POA: Diagnosis not present

## 2020-06-18 DIAGNOSIS — R197 Diarrhea, unspecified: Secondary | ICD-10-CM | POA: Diagnosis not present

## 2020-06-18 DIAGNOSIS — E785 Hyperlipidemia, unspecified: Secondary | ICD-10-CM | POA: Diagnosis not present

## 2020-06-18 DIAGNOSIS — R748 Abnormal levels of other serum enzymes: Secondary | ICD-10-CM | POA: Diagnosis not present

## 2020-06-18 DIAGNOSIS — R195 Other fecal abnormalities: Secondary | ICD-10-CM

## 2020-06-18 DIAGNOSIS — I10 Essential (primary) hypertension: Secondary | ICD-10-CM | POA: Diagnosis not present

## 2020-06-18 DIAGNOSIS — R82998 Other abnormal findings in urine: Secondary | ICD-10-CM | POA: Diagnosis not present

## 2020-06-18 LAB — COMPLETE METABOLIC PANEL WITH GFR
AG Ratio: 2 (calc) (ref 1.0–2.5)
ALT: 48 U/L — ABNORMAL HIGH (ref 6–29)
AST: 42 U/L — ABNORMAL HIGH (ref 10–35)
Albumin: 4.4 g/dL (ref 3.6–5.1)
Alkaline phosphatase (APISO): 167 U/L — ABNORMAL HIGH (ref 37–153)
BUN: 16 mg/dL (ref 7–25)
CO2: 27 mmol/L (ref 20–32)
Calcium: 9.3 mg/dL (ref 8.6–10.4)
Chloride: 95 mmol/L — ABNORMAL LOW (ref 98–110)
Creat: 0.93 mg/dL (ref 0.50–0.99)
GFR, Est African American: 73 mL/min/{1.73_m2} (ref >=60)
GFR, Est Non African American: 63 mL/min/{1.73_m2} (ref >=60)
Globulin: 2.2 g/dL (calc) (ref 1.9–3.7)
Glucose, Bld: 96 mg/dL (ref 65–99)
Potassium: 3.8 mmol/L (ref 3.5–5.3)
Sodium: 131 mmol/L — ABNORMAL LOW (ref 135–146)
Total Bilirubin: 1 mg/dL (ref 0.2–1.2)
Total Protein: 6.6 g/dL (ref 6.1–8.1)

## 2020-06-18 LAB — HEPATITIS PANEL, ACUTE
Hep A IgM: NONREACTIVE
Hep B C IgM: NONREACTIVE
Hepatitis B Surface Ag: NONREACTIVE
Hepatitis C Ab: NONREACTIVE
SIGNAL TO CUT-OFF: 0 (ref <1.00)

## 2020-06-18 LAB — LIPASE: Lipase: 167 U/L — ABNORMAL HIGH (ref 7–60)

## 2020-06-18 NOTE — Telephone Encounter (Signed)
Elevated ALT, AST, Alk Phos, and Lipase in the setting of abdominal pain, bloating, pale stools, and loose/frequent stools. Added Hepatitis Panel with reflex to lab orders. Stool sample will be dropped off at the lab today per patient. I will follow along, but wanted you aware since I will be out of the office for a few days.

## 2020-06-18 NOTE — Progress Notes (Signed)
Please call the patient- her labs show elevation in her liver and pancreatic enzymes. We do not have the results of the stool culture yet, but I will let you know when they come in.   For now I would like to add a hepatitis panel to the labs to make sure that there is nothing going on there. Are you feeling any better?  [name], Please call the lab and ask them to add a hepatitis panel to the blood work collected. Thank you!

## 2020-06-19 ENCOUNTER — Telehealth: Payer: Self-pay | Admitting: *Deleted

## 2020-06-19 DIAGNOSIS — R109 Unspecified abdominal pain: Secondary | ICD-10-CM | POA: Diagnosis not present

## 2020-06-19 LAB — LIPID PANEL W/REFLEX DIRECT LDL
Cholesterol: 284 mg/dL — ABNORMAL HIGH (ref <200)
HDL: 100 mg/dL (ref >=50)
LDL Cholesterol (Calc): 165 mg/dL (calc) — ABNORMAL HIGH
Non-HDL Cholesterol (Calc): 184 mg/dL (calc) — ABNORMAL HIGH (ref <130)
Total CHOL/HDL Ratio: 2.8 (calc) (ref <5.0)
Triglycerides: 82 mg/dL (ref <150)

## 2020-06-19 NOTE — Telephone Encounter (Signed)
Labs ordered per provider request through [facility].

## 2020-06-20 ENCOUNTER — Encounter: Payer: Self-pay | Admitting: Family Medicine

## 2020-06-20 DIAGNOSIS — R197 Diarrhea, unspecified: Secondary | ICD-10-CM

## 2020-06-20 DIAGNOSIS — R748 Abnormal levels of other serum enzymes: Secondary | ICD-10-CM

## 2020-06-20 DIAGNOSIS — K859 Acute pancreatitis without necrosis or infection, unspecified: Secondary | ICD-10-CM

## 2020-06-20 DIAGNOSIS — R109 Unspecified abdominal pain: Secondary | ICD-10-CM

## 2020-06-20 LAB — HEPATITIS PANEL(REFL)
HEPATITIS C ANTIBODY REFILL$(REFL): NONREACTIVE
Hep B Core Total Ab: NONREACTIVE
Hep B S Ab: NONREACTIVE
Hepatitis A AB,Total: NONREACTIVE
Hepatitis B Surface Ag: NONREACTIVE
SIGNAL TO CUT-OFF: 0 (ref <1.00)

## 2020-06-20 LAB — URINE CULTURE
MICRO NUMBER:: 11482980
SPECIMEN QUALITY:: ADEQUATE

## 2020-06-20 LAB — URINALYSIS
Bilirubin Urine: NEGATIVE
Glucose, UA: NEGATIVE
Hgb urine dipstick: NEGATIVE
Ketones, ur: NEGATIVE
Nitrite: NEGATIVE
Protein, ur: NEGATIVE
Specific Gravity, Urine: 1.009 (ref 1.001–1.03)
pH: 7 (ref 5.0–8.0)

## 2020-06-20 LAB — REFLEX TIQ

## 2020-06-20 NOTE — Telephone Encounter (Signed)
Ct ordered

## 2020-06-21 ENCOUNTER — Telehealth: Payer: Self-pay | Admitting: *Deleted

## 2020-06-21 NOTE — Telephone Encounter (Signed)
Pt told imaging that years and years ago she got really itchy with contrast so she would like for you to send in the contrast allergy protocol please.

## 2020-06-22 LAB — SALMONELLA/SHIGELLA CULT, CAMPY EIA AND SHIGA TOXIN RFL ECOLI
MICRO NUMBER: 11480193
MICRO NUMBER:: 11480194
MICRO NUMBER:: 11480195
Result:: NOT DETECTED
SHIGA RESULT:: NOT DETECTED
SPECIMEN QUALITY: ADEQUATE
SPECIMEN QUALITY:: ADEQUATE
SPECIMEN QUALITY:: ADEQUATE

## 2020-06-22 MED ORDER — PREDNISONE 50 MG PO TABS: ORAL_TABLET | ORAL | 0 refills | Status: AC

## 2020-06-22 MED ORDER — DIPHENHYDRAMINE HCL 50 MG PO TABS: ORAL_TABLET | ORAL | 0 refills | Status: AC

## 2020-06-22 NOTE — Telephone Encounter (Signed)
2 rx sent to [facility]

## 2020-06-22 NOTE — Telephone Encounter (Signed)
Pt.notified

## 2020-06-25 ENCOUNTER — Other Ambulatory Visit: Payer: Self-pay

## 2020-06-25 ENCOUNTER — Encounter: Payer: Self-pay | Admitting: Family Medicine

## 2020-06-25 ENCOUNTER — Ambulatory Visit (INDEPENDENT_AMBULATORY_CARE_PROVIDER_SITE_OTHER): Payer: Medicare HMO

## 2020-06-25 DIAGNOSIS — R17 Unspecified jaundice: Secondary | ICD-10-CM | POA: Diagnosis not present

## 2020-06-25 DIAGNOSIS — R110 Nausea: Secondary | ICD-10-CM

## 2020-06-25 DIAGNOSIS — R197 Diarrhea, unspecified: Secondary | ICD-10-CM

## 2020-06-25 DIAGNOSIS — K859 Acute pancreatitis without necrosis or infection, unspecified: Secondary | ICD-10-CM

## 2020-06-25 DIAGNOSIS — C250 Malignant neoplasm of head of pancreas: Secondary | ICD-10-CM | POA: Insufficient documentation

## 2020-06-25 DIAGNOSIS — R140 Abdominal distension (gaseous): Secondary | ICD-10-CM

## 2020-06-25 DIAGNOSIS — R109 Unspecified abdominal pain: Secondary | ICD-10-CM | POA: Diagnosis not present

## 2020-06-25 DIAGNOSIS — R748 Abnormal levels of other serum enzymes: Secondary | ICD-10-CM

## 2020-06-25 DIAGNOSIS — R10A2 Flank pain, left side: Secondary | ICD-10-CM

## 2020-06-25 MED ORDER — IOHEXOL 300 MG/ML  SOLN
100.0000 mL | Freq: Once | INTRAMUSCULAR | Status: AC | PRN
  Administered 2020-06-25: 100 mL via INTRAVENOUS

## 2020-06-27 ENCOUNTER — Telehealth: Payer: Self-pay

## 2020-06-27 ENCOUNTER — Encounter: Payer: Self-pay | Admitting: Family Medicine

## 2020-06-27 MED ORDER — ONDANSETRON 4 MG PO TBDP: 4.0000 mg | ORAL_TABLET | Freq: Three times a day (TID) | ORAL | 0 refills | Status: AC | PRN

## 2020-06-27 NOTE — Telephone Encounter (Signed)
Patient advised.

## 2020-06-27 NOTE — Telephone Encounter (Signed)
[name] called and would like something for the nausea she is having. She also mentioned not being called about the oncology referral. I sent a message to [facility] for her to check on the status.

## 2020-06-27 NOTE — Telephone Encounter (Signed)
[name] is working on the urgent referral. Patient is aware.

## 2020-06-27 NOTE — Telephone Encounter (Signed)
Med set. Referral is VERY Urgent!!!

## 2020-06-28 NOTE — Telephone Encounter (Signed)
Referral did not come to my que I printed and faxed everything yesterday urgently to [facility] and called them they should be contacting patient. I also spoke with patient to update her and let her know what happened. - CF

## 2020-06-28 NOTE — Telephone Encounter (Signed)
Urine culture showed no growth. No need for further intervention. If symptoms continue, please let us know. Dr. Madilyn Fireman has ordered a CT for continuing symptoms. No need to call at this time.

## 2020-06-29 ENCOUNTER — Encounter: Payer: Medicare HMO | Admitting: Family Medicine

## 2020-06-29 DIAGNOSIS — E871 Hypo-osmolality and hyponatremia: Secondary | ICD-10-CM | POA: Diagnosis not present

## 2020-06-29 DIAGNOSIS — F339 Major depressive disorder, recurrent, unspecified: Secondary | ICD-10-CM | POA: Diagnosis not present

## 2020-06-29 DIAGNOSIS — C787 Secondary malignant neoplasm of liver and intrahepatic bile duct: Secondary | ICD-10-CM | POA: Diagnosis not present

## 2020-06-29 DIAGNOSIS — Z885 Allergy status to narcotic agent status: Secondary | ICD-10-CM | POA: Diagnosis not present

## 2020-06-29 DIAGNOSIS — K851 Biliary acute pancreatitis without necrosis or infection: Secondary | ICD-10-CM | POA: Diagnosis not present

## 2020-06-29 DIAGNOSIS — F330 Major depressive disorder, recurrent, mild: Secondary | ICD-10-CM | POA: Diagnosis not present

## 2020-06-29 DIAGNOSIS — E876 Hypokalemia: Secondary | ICD-10-CM | POA: Diagnosis not present

## 2020-06-29 DIAGNOSIS — Z853 Personal history of malignant neoplasm of breast: Secondary | ICD-10-CM | POA: Diagnosis not present

## 2020-06-29 DIAGNOSIS — R109 Unspecified abdominal pain: Secondary | ICD-10-CM | POA: Diagnosis not present

## 2020-06-29 DIAGNOSIS — Z888 Allergy status to other drugs, medicaments and biological substances status: Secondary | ICD-10-CM | POA: Diagnosis not present

## 2020-06-29 DIAGNOSIS — K8689 Other specified diseases of pancreas: Secondary | ICD-10-CM | POA: Diagnosis not present

## 2020-06-29 DIAGNOSIS — R1013 Epigastric pain: Secondary | ICD-10-CM | POA: Diagnosis not present

## 2020-06-29 DIAGNOSIS — R17 Unspecified jaundice: Secondary | ICD-10-CM | POA: Diagnosis not present

## 2020-06-29 DIAGNOSIS — E860 Dehydration: Secondary | ICD-10-CM | POA: Diagnosis not present

## 2020-06-29 DIAGNOSIS — K769 Liver disease, unspecified: Secondary | ICD-10-CM | POA: Diagnosis not present

## 2020-06-29 DIAGNOSIS — J4540 Moderate persistent asthma, uncomplicated: Secondary | ICD-10-CM | POA: Diagnosis not present

## 2020-06-29 DIAGNOSIS — K869 Disease of pancreas, unspecified: Secondary | ICD-10-CM | POA: Diagnosis not present

## 2020-06-29 DIAGNOSIS — K859 Acute pancreatitis without necrosis or infection, unspecified: Secondary | ICD-10-CM | POA: Diagnosis not present

## 2020-06-29 DIAGNOSIS — C259 Malignant neoplasm of pancreas, unspecified: Secondary | ICD-10-CM | POA: Diagnosis not present

## 2020-06-29 DIAGNOSIS — R748 Abnormal levels of other serum enzymes: Secondary | ICD-10-CM | POA: Diagnosis not present

## 2020-06-29 DIAGNOSIS — K5900 Constipation, unspecified: Secondary | ICD-10-CM | POA: Diagnosis not present

## 2020-06-29 DIAGNOSIS — K831 Obstruction of bile duct: Secondary | ICD-10-CM | POA: Diagnosis not present

## 2020-06-29 DIAGNOSIS — R7401 Elevation of levels of liver transaminase levels: Secondary | ICD-10-CM | POA: Diagnosis not present

## 2020-06-29 DIAGNOSIS — H8103 Meniere's disease, bilateral: Secondary | ICD-10-CM | POA: Diagnosis not present

## 2020-06-29 DIAGNOSIS — I10 Essential (primary) hypertension: Secondary | ICD-10-CM | POA: Diagnosis not present

## 2020-06-30 DIAGNOSIS — J4540 Moderate persistent asthma, uncomplicated: Secondary | ICD-10-CM | POA: Diagnosis not present

## 2020-06-30 DIAGNOSIS — I10 Essential (primary) hypertension: Secondary | ICD-10-CM | POA: Diagnosis not present

## 2020-06-30 DIAGNOSIS — E876 Hypokalemia: Secondary | ICD-10-CM | POA: Diagnosis not present

## 2020-06-30 DIAGNOSIS — R7401 Elevation of levels of liver transaminase levels: Secondary | ICD-10-CM | POA: Diagnosis not present

## 2020-06-30 DIAGNOSIS — K831 Obstruction of bile duct: Secondary | ICD-10-CM | POA: Diagnosis not present

## 2020-06-30 DIAGNOSIS — E860 Dehydration: Secondary | ICD-10-CM | POA: Diagnosis not present

## 2020-06-30 DIAGNOSIS — E871 Hypo-osmolality and hyponatremia: Secondary | ICD-10-CM | POA: Diagnosis not present

## 2020-06-30 DIAGNOSIS — F330 Major depressive disorder, recurrent, mild: Secondary | ICD-10-CM | POA: Diagnosis not present

## 2020-06-30 DIAGNOSIS — C787 Secondary malignant neoplasm of liver and intrahepatic bile duct: Secondary | ICD-10-CM | POA: Diagnosis not present

## 2020-06-30 DIAGNOSIS — C259 Malignant neoplasm of pancreas, unspecified: Secondary | ICD-10-CM | POA: Diagnosis not present

## 2020-07-01 DIAGNOSIS — J4540 Moderate persistent asthma, uncomplicated: Secondary | ICD-10-CM | POA: Diagnosis not present

## 2020-07-01 DIAGNOSIS — C259 Malignant neoplasm of pancreas, unspecified: Secondary | ICD-10-CM | POA: Diagnosis not present

## 2020-07-01 DIAGNOSIS — K831 Obstruction of bile duct: Secondary | ICD-10-CM | POA: Diagnosis not present

## 2020-07-01 DIAGNOSIS — R7401 Elevation of levels of liver transaminase levels: Secondary | ICD-10-CM | POA: Diagnosis not present

## 2020-07-01 DIAGNOSIS — E860 Dehydration: Secondary | ICD-10-CM | POA: Diagnosis not present

## 2020-07-01 DIAGNOSIS — I10 Essential (primary) hypertension: Secondary | ICD-10-CM | POA: Diagnosis not present

## 2020-07-01 DIAGNOSIS — E871 Hypo-osmolality and hyponatremia: Secondary | ICD-10-CM | POA: Diagnosis not present

## 2020-07-01 DIAGNOSIS — E876 Hypokalemia: Secondary | ICD-10-CM | POA: Diagnosis not present

## 2020-07-01 DIAGNOSIS — C787 Secondary malignant neoplasm of liver and intrahepatic bile duct: Secondary | ICD-10-CM | POA: Diagnosis not present

## 2020-07-01 DIAGNOSIS — F330 Major depressive disorder, recurrent, mild: Secondary | ICD-10-CM | POA: Diagnosis not present

## 2020-07-02 DIAGNOSIS — K831 Obstruction of bile duct: Secondary | ICD-10-CM | POA: Diagnosis not present

## 2020-07-02 DIAGNOSIS — K8689 Other specified diseases of pancreas: Secondary | ICD-10-CM | POA: Diagnosis not present

## 2020-07-02 DIAGNOSIS — F330 Major depressive disorder, recurrent, mild: Secondary | ICD-10-CM | POA: Diagnosis not present

## 2020-07-02 DIAGNOSIS — E876 Hypokalemia: Secondary | ICD-10-CM | POA: Diagnosis not present

## 2020-07-02 DIAGNOSIS — I10 Essential (primary) hypertension: Secondary | ICD-10-CM | POA: Diagnosis not present

## 2020-07-02 DIAGNOSIS — K769 Liver disease, unspecified: Secondary | ICD-10-CM | POA: Diagnosis not present

## 2020-07-02 DIAGNOSIS — C787 Secondary malignant neoplasm of liver and intrahepatic bile duct: Secondary | ICD-10-CM | POA: Diagnosis not present

## 2020-07-02 DIAGNOSIS — E860 Dehydration: Secondary | ICD-10-CM | POA: Diagnosis not present

## 2020-07-02 DIAGNOSIS — C259 Malignant neoplasm of pancreas, unspecified: Secondary | ICD-10-CM | POA: Diagnosis not present

## 2020-07-02 DIAGNOSIS — R7401 Elevation of levels of liver transaminase levels: Secondary | ICD-10-CM | POA: Diagnosis not present

## 2020-07-02 DIAGNOSIS — J4540 Moderate persistent asthma, uncomplicated: Secondary | ICD-10-CM | POA: Diagnosis not present

## 2020-07-02 DIAGNOSIS — E871 Hypo-osmolality and hyponatremia: Secondary | ICD-10-CM | POA: Diagnosis not present

## 2020-07-03 DIAGNOSIS — K851 Biliary acute pancreatitis without necrosis or infection: Secondary | ICD-10-CM | POA: Diagnosis not present

## 2020-07-03 DIAGNOSIS — R7401 Elevation of levels of liver transaminase levels: Secondary | ICD-10-CM | POA: Diagnosis not present

## 2020-07-03 DIAGNOSIS — C787 Secondary malignant neoplasm of liver and intrahepatic bile duct: Secondary | ICD-10-CM | POA: Diagnosis not present

## 2020-07-03 DIAGNOSIS — E871 Hypo-osmolality and hyponatremia: Secondary | ICD-10-CM | POA: Diagnosis not present

## 2020-07-03 DIAGNOSIS — C259 Malignant neoplasm of pancreas, unspecified: Secondary | ICD-10-CM | POA: Diagnosis not present

## 2020-07-04 DIAGNOSIS — K7689 Other specified diseases of liver: Secondary | ICD-10-CM | POA: Diagnosis not present

## 2020-07-04 DIAGNOSIS — K869 Disease of pancreas, unspecified: Secondary | ICD-10-CM | POA: Diagnosis not present

## 2020-07-04 DIAGNOSIS — R911 Solitary pulmonary nodule: Secondary | ICD-10-CM | POA: Diagnosis not present

## 2020-07-09 DIAGNOSIS — R109 Unspecified abdominal pain: Secondary | ICD-10-CM | POA: Diagnosis not present

## 2020-07-09 DIAGNOSIS — Z853 Personal history of malignant neoplasm of breast: Secondary | ICD-10-CM | POA: Diagnosis not present

## 2020-07-09 DIAGNOSIS — H8103 Meniere's disease, bilateral: Secondary | ICD-10-CM | POA: Diagnosis not present

## 2020-07-09 DIAGNOSIS — K869 Disease of pancreas, unspecified: Secondary | ICD-10-CM | POA: Diagnosis not present

## 2020-07-09 DIAGNOSIS — C787 Secondary malignant neoplasm of liver and intrahepatic bile duct: Secondary | ICD-10-CM | POA: Diagnosis not present

## 2020-07-09 DIAGNOSIS — K769 Liver disease, unspecified: Secondary | ICD-10-CM | POA: Diagnosis not present

## 2020-07-09 DIAGNOSIS — R9389 Abnormal findings on diagnostic imaging of other specified body structures: Secondary | ICD-10-CM | POA: Diagnosis not present

## 2020-07-09 DIAGNOSIS — K851 Biliary acute pancreatitis without necrosis or infection: Secondary | ICD-10-CM | POA: Diagnosis not present

## 2020-07-09 DIAGNOSIS — C259 Malignant neoplasm of pancreas, unspecified: Secondary | ICD-10-CM | POA: Diagnosis not present

## 2020-07-09 DIAGNOSIS — K5900 Constipation, unspecified: Secondary | ICD-10-CM | POA: Diagnosis not present

## 2020-07-09 DIAGNOSIS — B373 Candidiasis of vulva and vagina: Secondary | ICD-10-CM | POA: Diagnosis not present

## 2020-07-09 DIAGNOSIS — F330 Major depressive disorder, recurrent, mild: Secondary | ICD-10-CM | POA: Diagnosis not present

## 2020-07-10 DIAGNOSIS — C787 Secondary malignant neoplasm of liver and intrahepatic bile duct: Secondary | ICD-10-CM | POA: Diagnosis not present

## 2020-07-10 DIAGNOSIS — C259 Malignant neoplasm of pancreas, unspecified: Secondary | ICD-10-CM | POA: Diagnosis not present

## 2020-07-12 DIAGNOSIS — K869 Disease of pancreas, unspecified: Secondary | ICD-10-CM | POA: Diagnosis not present

## 2020-07-12 DIAGNOSIS — C787 Secondary malignant neoplasm of liver and intrahepatic bile duct: Secondary | ICD-10-CM | POA: Diagnosis not present

## 2020-07-12 DIAGNOSIS — K5900 Constipation, unspecified: Secondary | ICD-10-CM | POA: Diagnosis not present

## 2020-07-12 DIAGNOSIS — R188 Other ascites: Secondary | ICD-10-CM | POA: Diagnosis not present

## 2020-07-12 DIAGNOSIS — R109 Unspecified abdominal pain: Secondary | ICD-10-CM | POA: Diagnosis not present

## 2020-07-12 DIAGNOSIS — N854 Malposition of uterus: Secondary | ICD-10-CM | POA: Diagnosis not present

## 2020-07-12 DIAGNOSIS — C259 Malignant neoplasm of pancreas, unspecified: Secondary | ICD-10-CM | POA: Diagnosis not present

## 2020-07-13 DIAGNOSIS — C259 Malignant neoplasm of pancreas, unspecified: Secondary | ICD-10-CM | POA: Diagnosis not present

## 2020-07-13 DIAGNOSIS — R9389 Abnormal findings on diagnostic imaging of other specified body structures: Secondary | ICD-10-CM | POA: Diagnosis not present

## 2020-07-13 DIAGNOSIS — R109 Unspecified abdominal pain: Secondary | ICD-10-CM | POA: Diagnosis not present

## 2020-07-13 DIAGNOSIS — Z853 Personal history of malignant neoplasm of breast: Secondary | ICD-10-CM | POA: Diagnosis not present

## 2020-07-13 DIAGNOSIS — K5900 Constipation, unspecified: Secondary | ICD-10-CM | POA: Diagnosis not present

## 2020-07-13 DIAGNOSIS — H8103 Meniere's disease, bilateral: Secondary | ICD-10-CM | POA: Diagnosis not present

## 2020-07-13 DIAGNOSIS — C787 Secondary malignant neoplasm of liver and intrahepatic bile duct: Secondary | ICD-10-CM | POA: Diagnosis not present

## 2020-07-13 DIAGNOSIS — B373 Candidiasis of vulva and vagina: Secondary | ICD-10-CM | POA: Diagnosis not present

## 2020-07-13 DIAGNOSIS — K869 Disease of pancreas, unspecified: Secondary | ICD-10-CM | POA: Diagnosis not present

## 2020-07-13 DIAGNOSIS — F330 Major depressive disorder, recurrent, mild: Secondary | ICD-10-CM | POA: Diagnosis not present

## 2020-07-13 DIAGNOSIS — K851 Biliary acute pancreatitis without necrosis or infection: Secondary | ICD-10-CM | POA: Diagnosis not present

## 2020-07-13 DIAGNOSIS — K769 Liver disease, unspecified: Secondary | ICD-10-CM | POA: Diagnosis not present

## 2020-07-16 DIAGNOSIS — C259 Malignant neoplasm of pancreas, unspecified: Secondary | ICD-10-CM | POA: Diagnosis not present

## 2020-07-16 DIAGNOSIS — C787 Secondary malignant neoplasm of liver and intrahepatic bile duct: Secondary | ICD-10-CM | POA: Diagnosis not present

## 2020-07-17 DIAGNOSIS — R509 Fever, unspecified: Secondary | ICD-10-CM | POA: Diagnosis not present

## 2020-07-17 DIAGNOSIS — Z5111 Encounter for antineoplastic chemotherapy: Secondary | ICD-10-CM | POA: Diagnosis not present

## 2020-07-17 DIAGNOSIS — R21 Rash and other nonspecific skin eruption: Secondary | ICD-10-CM | POA: Diagnosis not present

## 2020-07-17 DIAGNOSIS — C259 Malignant neoplasm of pancreas, unspecified: Secondary | ICD-10-CM | POA: Diagnosis not present

## 2020-07-17 DIAGNOSIS — T451X5A Adverse effect of antineoplastic and immunosuppressive drugs, initial encounter: Secondary | ICD-10-CM | POA: Diagnosis not present

## 2020-07-17 DIAGNOSIS — C787 Secondary malignant neoplasm of liver and intrahepatic bile duct: Secondary | ICD-10-CM | POA: Diagnosis not present

## 2020-07-17 DIAGNOSIS — R9389 Abnormal findings on diagnostic imaging of other specified body structures: Secondary | ICD-10-CM | POA: Diagnosis not present

## 2020-07-17 DIAGNOSIS — H8103 Meniere's disease, bilateral: Secondary | ICD-10-CM | POA: Diagnosis not present

## 2020-07-17 DIAGNOSIS — G893 Neoplasm related pain (acute) (chronic): Secondary | ICD-10-CM | POA: Diagnosis not present

## 2020-07-18 DIAGNOSIS — C259 Malignant neoplasm of pancreas, unspecified: Secondary | ICD-10-CM | POA: Diagnosis not present

## 2020-07-19 DIAGNOSIS — Z452 Encounter for adjustment and management of vascular access device: Secondary | ICD-10-CM | POA: Diagnosis not present

## 2020-07-19 DIAGNOSIS — C259 Malignant neoplasm of pancreas, unspecified: Secondary | ICD-10-CM | POA: Diagnosis not present

## 2020-07-19 DIAGNOSIS — C787 Secondary malignant neoplasm of liver and intrahepatic bile duct: Secondary | ICD-10-CM | POA: Diagnosis not present

## 2020-07-23 DIAGNOSIS — G893 Neoplasm related pain (acute) (chronic): Secondary | ICD-10-CM | POA: Diagnosis not present

## 2020-07-23 DIAGNOSIS — Z5111 Encounter for antineoplastic chemotherapy: Secondary | ICD-10-CM | POA: Diagnosis not present

## 2020-07-23 DIAGNOSIS — C259 Malignant neoplasm of pancreas, unspecified: Secondary | ICD-10-CM | POA: Diagnosis not present

## 2020-07-23 DIAGNOSIS — C787 Secondary malignant neoplasm of liver and intrahepatic bile duct: Secondary | ICD-10-CM | POA: Diagnosis not present

## 2020-07-23 DIAGNOSIS — T451X5A Adverse effect of antineoplastic and immunosuppressive drugs, initial encounter: Secondary | ICD-10-CM | POA: Diagnosis not present

## 2020-07-23 DIAGNOSIS — H8103 Meniere's disease, bilateral: Secondary | ICD-10-CM | POA: Diagnosis not present

## 2020-07-23 DIAGNOSIS — R509 Fever, unspecified: Secondary | ICD-10-CM | POA: Diagnosis not present

## 2020-07-23 DIAGNOSIS — R21 Rash and other nonspecific skin eruption: Secondary | ICD-10-CM | POA: Diagnosis not present

## 2020-07-23 DIAGNOSIS — R9389 Abnormal findings on diagnostic imaging of other specified body structures: Secondary | ICD-10-CM | POA: Diagnosis not present

## 2020-07-25 DIAGNOSIS — C259 Malignant neoplasm of pancreas, unspecified: Secondary | ICD-10-CM | POA: Diagnosis not present

## 2020-07-25 DIAGNOSIS — R21 Rash and other nonspecific skin eruption: Secondary | ICD-10-CM | POA: Diagnosis not present

## 2020-07-25 DIAGNOSIS — H8103 Meniere's disease, bilateral: Secondary | ICD-10-CM | POA: Diagnosis not present

## 2020-07-25 DIAGNOSIS — Z5111 Encounter for antineoplastic chemotherapy: Secondary | ICD-10-CM | POA: Diagnosis not present

## 2020-07-25 DIAGNOSIS — R9389 Abnormal findings on diagnostic imaging of other specified body structures: Secondary | ICD-10-CM | POA: Diagnosis not present

## 2020-07-25 DIAGNOSIS — T451X5A Adverse effect of antineoplastic and immunosuppressive drugs, initial encounter: Secondary | ICD-10-CM | POA: Diagnosis not present

## 2020-07-25 DIAGNOSIS — R509 Fever, unspecified: Secondary | ICD-10-CM | POA: Diagnosis not present

## 2020-07-25 DIAGNOSIS — G893 Neoplasm related pain (acute) (chronic): Secondary | ICD-10-CM | POA: Diagnosis not present

## 2020-07-25 DIAGNOSIS — C787 Secondary malignant neoplasm of liver and intrahepatic bile duct: Secondary | ICD-10-CM | POA: Diagnosis not present

## 2020-07-30 DIAGNOSIS — Z79899 Other long term (current) drug therapy: Secondary | ICD-10-CM | POA: Diagnosis not present

## 2020-07-30 DIAGNOSIS — Z5181 Encounter for therapeutic drug level monitoring: Secondary | ICD-10-CM | POA: Diagnosis not present

## 2020-07-30 DIAGNOSIS — R1084 Generalized abdominal pain: Secondary | ICD-10-CM | POA: Diagnosis not present

## 2020-07-30 DIAGNOSIS — G893 Neoplasm related pain (acute) (chronic): Secondary | ICD-10-CM | POA: Diagnosis not present

## 2020-07-30 DIAGNOSIS — R1011 Right upper quadrant pain: Secondary | ICD-10-CM | POA: Diagnosis not present

## 2020-08-02 DIAGNOSIS — C259 Malignant neoplasm of pancreas, unspecified: Secondary | ICD-10-CM | POA: Diagnosis not present

## 2020-08-02 DIAGNOSIS — C787 Secondary malignant neoplasm of liver and intrahepatic bile duct: Secondary | ICD-10-CM | POA: Diagnosis not present

## 2020-08-02 DIAGNOSIS — C50112 Malignant neoplasm of central portion of left female breast: Secondary | ICD-10-CM | POA: Diagnosis not present

## 2020-08-02 DIAGNOSIS — Z170 Estrogen receptor positive status [ER+]: Secondary | ICD-10-CM | POA: Diagnosis not present

## 2020-08-03 DIAGNOSIS — Z5111 Encounter for antineoplastic chemotherapy: Secondary | ICD-10-CM | POA: Diagnosis not present

## 2020-08-03 DIAGNOSIS — T451X5A Adverse effect of antineoplastic and immunosuppressive drugs, initial encounter: Secondary | ICD-10-CM | POA: Diagnosis not present

## 2020-08-03 DIAGNOSIS — C259 Malignant neoplasm of pancreas, unspecified: Secondary | ICD-10-CM | POA: Diagnosis not present

## 2020-08-03 DIAGNOSIS — H8103 Meniere's disease, bilateral: Secondary | ICD-10-CM | POA: Diagnosis not present

## 2020-08-03 DIAGNOSIS — C787 Secondary malignant neoplasm of liver and intrahepatic bile duct: Secondary | ICD-10-CM | POA: Diagnosis not present

## 2020-08-03 DIAGNOSIS — G893 Neoplasm related pain (acute) (chronic): Secondary | ICD-10-CM | POA: Diagnosis not present

## 2020-08-03 DIAGNOSIS — R9389 Abnormal findings on diagnostic imaging of other specified body structures: Secondary | ICD-10-CM | POA: Diagnosis not present

## 2020-08-03 DIAGNOSIS — R509 Fever, unspecified: Secondary | ICD-10-CM | POA: Diagnosis not present

## 2020-08-03 DIAGNOSIS — R21 Rash and other nonspecific skin eruption: Secondary | ICD-10-CM | POA: Diagnosis not present

## 2020-08-06 DIAGNOSIS — R9389 Abnormal findings on diagnostic imaging of other specified body structures: Secondary | ICD-10-CM | POA: Diagnosis not present

## 2020-08-06 DIAGNOSIS — Z5111 Encounter for antineoplastic chemotherapy: Secondary | ICD-10-CM | POA: Diagnosis not present

## 2020-08-06 DIAGNOSIS — R509 Fever, unspecified: Secondary | ICD-10-CM | POA: Diagnosis not present

## 2020-08-06 DIAGNOSIS — D701 Agranulocytosis secondary to cancer chemotherapy: Secondary | ICD-10-CM | POA: Diagnosis not present

## 2020-08-06 DIAGNOSIS — C787 Secondary malignant neoplasm of liver and intrahepatic bile duct: Secondary | ICD-10-CM | POA: Diagnosis not present

## 2020-08-06 DIAGNOSIS — H8103 Meniere's disease, bilateral: Secondary | ICD-10-CM | POA: Diagnosis not present

## 2020-08-06 DIAGNOSIS — R21 Rash and other nonspecific skin eruption: Secondary | ICD-10-CM | POA: Diagnosis not present

## 2020-08-06 DIAGNOSIS — E876 Hypokalemia: Secondary | ICD-10-CM | POA: Diagnosis not present

## 2020-08-06 DIAGNOSIS — G893 Neoplasm related pain (acute) (chronic): Secondary | ICD-10-CM | POA: Diagnosis not present

## 2020-08-06 DIAGNOSIS — C259 Malignant neoplasm of pancreas, unspecified: Secondary | ICD-10-CM | POA: Diagnosis not present

## 2020-08-06 DIAGNOSIS — T451X5A Adverse effect of antineoplastic and immunosuppressive drugs, initial encounter: Secondary | ICD-10-CM | POA: Diagnosis not present

## 2020-08-06 DIAGNOSIS — R110 Nausea: Secondary | ICD-10-CM | POA: Diagnosis not present

## 2020-08-06 DIAGNOSIS — R634 Abnormal weight loss: Secondary | ICD-10-CM | POA: Diagnosis not present

## 2020-08-06 DIAGNOSIS — K521 Toxic gastroenteritis and colitis: Secondary | ICD-10-CM | POA: Diagnosis not present

## 2020-08-08 DIAGNOSIS — C787 Secondary malignant neoplasm of liver and intrahepatic bile duct: Secondary | ICD-10-CM | POA: Diagnosis not present

## 2020-08-08 DIAGNOSIS — C259 Malignant neoplasm of pancreas, unspecified: Secondary | ICD-10-CM | POA: Diagnosis not present

## 2020-08-08 DIAGNOSIS — Z5112 Encounter for antineoplastic immunotherapy: Secondary | ICD-10-CM | POA: Diagnosis not present

## 2020-08-13 DIAGNOSIS — R1084 Generalized abdominal pain: Secondary | ICD-10-CM | POA: Diagnosis not present

## 2020-08-13 DIAGNOSIS — G893 Neoplasm related pain (acute) (chronic): Secondary | ICD-10-CM | POA: Diagnosis not present

## 2020-08-16 DIAGNOSIS — Z5111 Encounter for antineoplastic chemotherapy: Secondary | ICD-10-CM | POA: Diagnosis not present

## 2020-08-16 DIAGNOSIS — R509 Fever, unspecified: Secondary | ICD-10-CM | POA: Diagnosis not present

## 2020-08-16 DIAGNOSIS — C259 Malignant neoplasm of pancreas, unspecified: Secondary | ICD-10-CM | POA: Diagnosis not present

## 2020-08-16 DIAGNOSIS — R9389 Abnormal findings on diagnostic imaging of other specified body structures: Secondary | ICD-10-CM | POA: Diagnosis not present

## 2020-08-16 DIAGNOSIS — T451X5A Adverse effect of antineoplastic and immunosuppressive drugs, initial encounter: Secondary | ICD-10-CM | POA: Diagnosis not present

## 2020-08-16 DIAGNOSIS — H8103 Meniere's disease, bilateral: Secondary | ICD-10-CM | POA: Diagnosis not present

## 2020-08-16 DIAGNOSIS — C787 Secondary malignant neoplasm of liver and intrahepatic bile duct: Secondary | ICD-10-CM | POA: Diagnosis not present

## 2020-08-16 DIAGNOSIS — G893 Neoplasm related pain (acute) (chronic): Secondary | ICD-10-CM | POA: Diagnosis not present

## 2020-08-16 DIAGNOSIS — R21 Rash and other nonspecific skin eruption: Secondary | ICD-10-CM | POA: Diagnosis not present

## 2020-08-20 DIAGNOSIS — J4530 Mild persistent asthma, uncomplicated: Secondary | ICD-10-CM | POA: Diagnosis not present

## 2020-08-20 DIAGNOSIS — Z5111 Encounter for antineoplastic chemotherapy: Secondary | ICD-10-CM | POA: Diagnosis not present

## 2020-08-20 DIAGNOSIS — R110 Nausea: Secondary | ICD-10-CM | POA: Diagnosis not present

## 2020-08-20 DIAGNOSIS — C259 Malignant neoplasm of pancreas, unspecified: Secondary | ICD-10-CM | POA: Diagnosis not present

## 2020-08-20 DIAGNOSIS — D701 Agranulocytosis secondary to cancer chemotherapy: Secondary | ICD-10-CM | POA: Diagnosis not present

## 2020-08-20 DIAGNOSIS — G893 Neoplasm related pain (acute) (chronic): Secondary | ICD-10-CM | POA: Diagnosis not present

## 2020-08-20 DIAGNOSIS — I340 Nonrheumatic mitral (valve) insufficiency: Secondary | ICD-10-CM | POA: Diagnosis not present

## 2020-08-20 DIAGNOSIS — G620 Drug-induced polyneuropathy: Secondary | ICD-10-CM | POA: Diagnosis not present

## 2020-08-20 DIAGNOSIS — E876 Hypokalemia: Secondary | ICD-10-CM | POA: Diagnosis not present

## 2020-08-20 DIAGNOSIS — K521 Toxic gastroenteritis and colitis: Secondary | ICD-10-CM | POA: Diagnosis not present

## 2020-08-20 DIAGNOSIS — C250 Malignant neoplasm of head of pancreas: Secondary | ICD-10-CM | POA: Diagnosis not present

## 2020-08-20 DIAGNOSIS — R634 Abnormal weight loss: Secondary | ICD-10-CM | POA: Diagnosis not present

## 2020-08-20 DIAGNOSIS — C787 Secondary malignant neoplasm of liver and intrahepatic bile duct: Secondary | ICD-10-CM | POA: Diagnosis not present

## 2020-08-20 DIAGNOSIS — R21 Rash and other nonspecific skin eruption: Secondary | ICD-10-CM | POA: Diagnosis not present

## 2020-08-27 DIAGNOSIS — F339 Major depressive disorder, recurrent, unspecified: Secondary | ICD-10-CM | POA: Diagnosis not present

## 2020-08-27 DIAGNOSIS — D649 Anemia, unspecified: Secondary | ICD-10-CM | POA: Diagnosis not present

## 2020-08-27 DIAGNOSIS — E785 Hyperlipidemia, unspecified: Secondary | ICD-10-CM | POA: Diagnosis not present

## 2020-08-27 DIAGNOSIS — C787 Secondary malignant neoplasm of liver and intrahepatic bile duct: Secondary | ICD-10-CM | POA: Diagnosis not present

## 2020-08-27 DIAGNOSIS — C250 Malignant neoplasm of head of pancreas: Secondary | ICD-10-CM | POA: Diagnosis not present

## 2020-08-27 DIAGNOSIS — C259 Malignant neoplasm of pancreas, unspecified: Secondary | ICD-10-CM | POA: Diagnosis not present

## 2020-08-27 DIAGNOSIS — M8588 Other specified disorders of bone density and structure, other site: Secondary | ICD-10-CM | POA: Diagnosis not present

## 2020-08-27 DIAGNOSIS — D509 Iron deficiency anemia, unspecified: Secondary | ICD-10-CM | POA: Diagnosis not present

## 2020-08-27 DIAGNOSIS — R9389 Abnormal findings on diagnostic imaging of other specified body structures: Secondary | ICD-10-CM | POA: Diagnosis not present

## 2020-08-27 DIAGNOSIS — R110 Nausea: Secondary | ICD-10-CM | POA: Diagnosis not present

## 2020-08-27 DIAGNOSIS — Z5111 Encounter for antineoplastic chemotherapy: Secondary | ICD-10-CM | POA: Diagnosis not present

## 2020-08-29 DIAGNOSIS — C259 Malignant neoplasm of pancreas, unspecified: Secondary | ICD-10-CM | POA: Diagnosis not present

## 2020-08-29 DIAGNOSIS — C787 Secondary malignant neoplasm of liver and intrahepatic bile duct: Secondary | ICD-10-CM | POA: Diagnosis not present

## 2020-08-29 DIAGNOSIS — Z5112 Encounter for antineoplastic immunotherapy: Secondary | ICD-10-CM | POA: Diagnosis not present

## 2020-09-10 DIAGNOSIS — C259 Malignant neoplasm of pancreas, unspecified: Secondary | ICD-10-CM | POA: Diagnosis not present

## 2020-09-10 DIAGNOSIS — Z5111 Encounter for antineoplastic chemotherapy: Secondary | ICD-10-CM | POA: Diagnosis not present

## 2020-09-10 DIAGNOSIS — Z86010 Personal history of colonic polyps: Secondary | ICD-10-CM | POA: Diagnosis not present

## 2020-09-10 DIAGNOSIS — R110 Nausea: Secondary | ICD-10-CM | POA: Diagnosis not present

## 2020-09-10 DIAGNOSIS — K521 Toxic gastroenteritis and colitis: Secondary | ICD-10-CM | POA: Diagnosis not present

## 2020-09-10 DIAGNOSIS — T451X5A Adverse effect of antineoplastic and immunosuppressive drugs, initial encounter: Secondary | ICD-10-CM | POA: Diagnosis not present

## 2020-09-10 DIAGNOSIS — C787 Secondary malignant neoplasm of liver and intrahepatic bile duct: Secondary | ICD-10-CM | POA: Diagnosis not present

## 2020-09-10 DIAGNOSIS — E785 Hyperlipidemia, unspecified: Secondary | ICD-10-CM | POA: Diagnosis not present

## 2020-09-10 DIAGNOSIS — D500 Iron deficiency anemia secondary to blood loss (chronic): Secondary | ICD-10-CM | POA: Diagnosis not present

## 2020-09-12 DIAGNOSIS — C787 Secondary malignant neoplasm of liver and intrahepatic bile duct: Secondary | ICD-10-CM | POA: Diagnosis not present

## 2020-09-12 DIAGNOSIS — Z5112 Encounter for antineoplastic immunotherapy: Secondary | ICD-10-CM | POA: Diagnosis not present

## 2020-09-12 DIAGNOSIS — C50112 Malignant neoplasm of central portion of left female breast: Secondary | ICD-10-CM | POA: Diagnosis not present

## 2020-09-12 DIAGNOSIS — Z170 Estrogen receptor positive status [ER+]: Secondary | ICD-10-CM | POA: Diagnosis not present

## 2020-09-12 DIAGNOSIS — C259 Malignant neoplasm of pancreas, unspecified: Secondary | ICD-10-CM | POA: Diagnosis not present

## 2020-09-12 DIAGNOSIS — D630 Anemia in neoplastic disease: Secondary | ICD-10-CM | POA: Diagnosis not present

## 2020-09-19 ENCOUNTER — Other Ambulatory Visit: Payer: Self-pay | Admitting: Family Medicine

## 2020-09-19 DIAGNOSIS — I10 Essential (primary) hypertension: Secondary | ICD-10-CM

## 2020-09-20 DIAGNOSIS — C259 Malignant neoplasm of pancreas, unspecified: Secondary | ICD-10-CM | POA: Diagnosis not present

## 2020-09-20 DIAGNOSIS — J4530 Mild persistent asthma, uncomplicated: Secondary | ICD-10-CM | POA: Diagnosis not present

## 2020-09-20 DIAGNOSIS — F32A Depression, unspecified: Secondary | ICD-10-CM | POA: Diagnosis not present

## 2020-09-20 DIAGNOSIS — K831 Obstruction of bile duct: Secondary | ICD-10-CM | POA: Diagnosis not present

## 2020-09-20 DIAGNOSIS — I340 Nonrheumatic mitral (valve) insufficiency: Secondary | ICD-10-CM | POA: Diagnosis not present

## 2020-09-20 DIAGNOSIS — C240 Malignant neoplasm of extrahepatic bile duct: Secondary | ICD-10-CM | POA: Diagnosis not present

## 2020-09-20 DIAGNOSIS — E785 Hyperlipidemia, unspecified: Secondary | ICD-10-CM | POA: Diagnosis not present

## 2020-09-20 DIAGNOSIS — J4540 Moderate persistent asthma, uncomplicated: Secondary | ICD-10-CM | POA: Diagnosis not present

## 2020-09-20 DIAGNOSIS — F339 Major depressive disorder, recurrent, unspecified: Secondary | ICD-10-CM | POA: Diagnosis not present

## 2020-09-20 DIAGNOSIS — C787 Secondary malignant neoplasm of liver and intrahepatic bile duct: Secondary | ICD-10-CM | POA: Diagnosis not present

## 2020-09-20 DIAGNOSIS — Z9689 Presence of other specified functional implants: Secondary | ICD-10-CM | POA: Diagnosis not present

## 2020-09-20 DIAGNOSIS — I10 Essential (primary) hypertension: Secondary | ICD-10-CM | POA: Diagnosis not present

## 2020-09-24 DIAGNOSIS — D649 Anemia, unspecified: Secondary | ICD-10-CM | POA: Diagnosis not present

## 2020-09-24 DIAGNOSIS — Z79899 Other long term (current) drug therapy: Secondary | ICD-10-CM | POA: Diagnosis not present

## 2020-09-24 DIAGNOSIS — C259 Malignant neoplasm of pancreas, unspecified: Secondary | ICD-10-CM | POA: Diagnosis not present

## 2020-09-24 DIAGNOSIS — C50112 Malignant neoplasm of central portion of left female breast: Secondary | ICD-10-CM | POA: Diagnosis not present

## 2020-09-24 DIAGNOSIS — K521 Toxic gastroenteritis and colitis: Secondary | ICD-10-CM | POA: Diagnosis not present

## 2020-09-24 DIAGNOSIS — Z170 Estrogen receptor positive status [ER+]: Secondary | ICD-10-CM | POA: Diagnosis not present

## 2020-09-24 DIAGNOSIS — C250 Malignant neoplasm of head of pancreas: Secondary | ICD-10-CM | POA: Diagnosis not present

## 2020-09-24 DIAGNOSIS — C787 Secondary malignant neoplasm of liver and intrahepatic bile duct: Secondary | ICD-10-CM | POA: Diagnosis not present

## 2020-09-24 DIAGNOSIS — T451X5A Adverse effect of antineoplastic and immunosuppressive drugs, initial encounter: Secondary | ICD-10-CM | POA: Diagnosis not present

## 2020-09-24 DIAGNOSIS — R140 Abdominal distension (gaseous): Secondary | ICD-10-CM | POA: Diagnosis not present

## 2020-09-24 DIAGNOSIS — R110 Nausea: Secondary | ICD-10-CM | POA: Diagnosis not present

## 2020-09-24 DIAGNOSIS — D509 Iron deficiency anemia, unspecified: Secondary | ICD-10-CM | POA: Diagnosis not present

## 2020-09-24 DIAGNOSIS — Z5111 Encounter for antineoplastic chemotherapy: Secondary | ICD-10-CM | POA: Diagnosis not present

## 2020-09-26 DIAGNOSIS — C787 Secondary malignant neoplasm of liver and intrahepatic bile duct: Secondary | ICD-10-CM | POA: Diagnosis not present

## 2020-09-26 DIAGNOSIS — C259 Malignant neoplasm of pancreas, unspecified: Secondary | ICD-10-CM | POA: Diagnosis not present

## 2020-10-07 DIAGNOSIS — Z20822 Contact with and (suspected) exposure to covid-19: Secondary | ICD-10-CM | POA: Diagnosis not present

## 2020-10-08 ENCOUNTER — Telehealth: Payer: Self-pay | Admitting: Family Medicine

## 2020-10-08 NOTE — Progress Notes (Signed)
  Chronic Care Management   Outreach Note  10/08/2020 Name: BRENYA TAULBEE MRN: [id number] DOB: 04/22/1951  Referred by: Hali Marry, MD Reason for referral : No chief complaint on file.   An unsuccessful telephone outreach was attempted today. The patient was referred to the pharmacist for assistance with care management and care coordination.   Follow Up Plan:   Lauretta Grill Upstream Scheduler

## 2020-10-13 DIAGNOSIS — Z20822 Contact with and (suspected) exposure to covid-19: Secondary | ICD-10-CM | POA: Diagnosis not present

## 2020-10-17 DIAGNOSIS — E8809 Other disorders of plasma-protein metabolism, not elsewhere classified: Secondary | ICD-10-CM | POA: Diagnosis not present

## 2020-10-17 DIAGNOSIS — T451X5A Adverse effect of antineoplastic and immunosuppressive drugs, initial encounter: Secondary | ICD-10-CM | POA: Diagnosis not present

## 2020-10-17 DIAGNOSIS — C787 Secondary malignant neoplasm of liver and intrahepatic bile duct: Secondary | ICD-10-CM | POA: Diagnosis not present

## 2020-10-17 DIAGNOSIS — D701 Agranulocytosis secondary to cancer chemotherapy: Secondary | ICD-10-CM | POA: Diagnosis not present

## 2020-10-17 DIAGNOSIS — R600 Localized edema: Secondary | ICD-10-CM | POA: Diagnosis not present

## 2020-10-17 DIAGNOSIS — G620 Drug-induced polyneuropathy: Secondary | ICD-10-CM | POA: Diagnosis not present

## 2020-10-17 DIAGNOSIS — R9389 Abnormal findings on diagnostic imaging of other specified body structures: Secondary | ICD-10-CM | POA: Diagnosis not present

## 2020-10-17 DIAGNOSIS — C259 Malignant neoplasm of pancreas, unspecified: Secondary | ICD-10-CM | POA: Diagnosis not present

## 2020-10-17 DIAGNOSIS — Z5111 Encounter for antineoplastic chemotherapy: Secondary | ICD-10-CM | POA: Diagnosis not present

## 2020-10-17 DIAGNOSIS — C250 Malignant neoplasm of head of pancreas: Secondary | ICD-10-CM | POA: Diagnosis not present

## 2020-10-17 DIAGNOSIS — K521 Toxic gastroenteritis and colitis: Secondary | ICD-10-CM | POA: Diagnosis not present

## 2020-10-17 DIAGNOSIS — R112 Nausea with vomiting, unspecified: Secondary | ICD-10-CM | POA: Diagnosis not present

## 2020-10-17 DIAGNOSIS — Z853 Personal history of malignant neoplasm of breast: Secondary | ICD-10-CM | POA: Diagnosis not present

## 2020-10-19 DIAGNOSIS — C259 Malignant neoplasm of pancreas, unspecified: Secondary | ICD-10-CM | POA: Diagnosis not present

## 2020-10-19 DIAGNOSIS — C787 Secondary malignant neoplasm of liver and intrahepatic bile duct: Secondary | ICD-10-CM | POA: Diagnosis not present

## 2020-10-22 DIAGNOSIS — C787 Secondary malignant neoplasm of liver and intrahepatic bile duct: Secondary | ICD-10-CM | POA: Diagnosis not present

## 2020-10-22 DIAGNOSIS — R911 Solitary pulmonary nodule: Secondary | ICD-10-CM | POA: Diagnosis not present

## 2020-10-22 DIAGNOSIS — K838 Other specified diseases of biliary tract: Secondary | ICD-10-CM | POA: Diagnosis not present

## 2020-10-22 DIAGNOSIS — C259 Malignant neoplasm of pancreas, unspecified: Secondary | ICD-10-CM | POA: Diagnosis not present

## 2020-10-22 DIAGNOSIS — J984 Other disorders of lung: Secondary | ICD-10-CM | POA: Diagnosis not present

## 2020-10-22 DIAGNOSIS — R188 Other ascites: Secondary | ICD-10-CM | POA: Diagnosis not present

## 2020-10-24 ENCOUNTER — Telehealth: Payer: Self-pay | Admitting: Family Medicine

## 2020-10-24 NOTE — Progress Notes (Signed)
  Chronic Care Management   Outreach Note  10/24/2020 Name: SUMMERLYN FICKEL MRN: [id number] DOB: [birth year]  Referred by: Hali Marry, MD Reason for referral : No chief complaint on file.   A second unsuccessful telephone outreach was attempted today. The patient was referred to pharmacist for assistance with care management and care coordination.  Follow Up Plan:   Lauretta Grill Upstream Scheduler

## 2020-10-30 DIAGNOSIS — Z79899 Other long term (current) drug therapy: Secondary | ICD-10-CM | POA: Diagnosis not present

## 2020-10-30 DIAGNOSIS — Z5111 Encounter for antineoplastic chemotherapy: Secondary | ICD-10-CM | POA: Diagnosis not present

## 2020-10-30 DIAGNOSIS — G620 Drug-induced polyneuropathy: Secondary | ICD-10-CM | POA: Diagnosis not present

## 2020-10-30 DIAGNOSIS — C787 Secondary malignant neoplasm of liver and intrahepatic bile duct: Secondary | ICD-10-CM | POA: Diagnosis not present

## 2020-10-30 DIAGNOSIS — C259 Malignant neoplasm of pancreas, unspecified: Secondary | ICD-10-CM | POA: Diagnosis not present

## 2020-10-30 DIAGNOSIS — T451X5A Adverse effect of antineoplastic and immunosuppressive drugs, initial encounter: Secondary | ICD-10-CM | POA: Diagnosis not present

## 2020-11-01 DIAGNOSIS — C787 Secondary malignant neoplasm of liver and intrahepatic bile duct: Secondary | ICD-10-CM | POA: Diagnosis not present

## 2020-11-01 DIAGNOSIS — C259 Malignant neoplasm of pancreas, unspecified: Secondary | ICD-10-CM | POA: Diagnosis not present

## 2020-11-13 ENCOUNTER — Telehealth: Payer: Self-pay | Admitting: Family Medicine

## 2020-11-13 DIAGNOSIS — C259 Malignant neoplasm of pancreas, unspecified: Secondary | ICD-10-CM | POA: Diagnosis not present

## 2020-11-13 DIAGNOSIS — D509 Iron deficiency anemia, unspecified: Secondary | ICD-10-CM | POA: Diagnosis not present

## 2020-11-13 DIAGNOSIS — R9389 Abnormal findings on diagnostic imaging of other specified body structures: Secondary | ICD-10-CM | POA: Diagnosis not present

## 2020-11-13 DIAGNOSIS — R188 Other ascites: Secondary | ICD-10-CM | POA: Diagnosis not present

## 2020-11-13 DIAGNOSIS — R21 Rash and other nonspecific skin eruption: Secondary | ICD-10-CM | POA: Diagnosis not present

## 2020-11-13 DIAGNOSIS — C787 Secondary malignant neoplasm of liver and intrahepatic bile duct: Secondary | ICD-10-CM | POA: Diagnosis not present

## 2020-11-13 DIAGNOSIS — C50112 Malignant neoplasm of central portion of left female breast: Secondary | ICD-10-CM | POA: Diagnosis not present

## 2020-11-13 DIAGNOSIS — Z5111 Encounter for antineoplastic chemotherapy: Secondary | ICD-10-CM | POA: Diagnosis not present

## 2020-11-13 DIAGNOSIS — Z170 Estrogen receptor positive status [ER+]: Secondary | ICD-10-CM | POA: Diagnosis not present

## 2020-11-13 DIAGNOSIS — C250 Malignant neoplasm of head of pancreas: Secondary | ICD-10-CM | POA: Diagnosis not present

## 2020-11-13 NOTE — Progress Notes (Signed)
  Chronic Care Management   Outreach Note  11/13/2020 Name: [name] MRN: [id number] DOB: 1950/10/12  Referred by: Hali Marry, MD Reason for referral : No chief complaint on file.   Third unsuccessful telephone outreach was attempted today. The patient was referred to the pharmacist for assistance with care management and care coordination.   Follow Up Plan:   Lauretta Grill Upstream Scheduler

## 2020-11-15 DIAGNOSIS — C259 Malignant neoplasm of pancreas, unspecified: Secondary | ICD-10-CM | POA: Diagnosis not present

## 2020-11-15 DIAGNOSIS — Z1231 Encounter for screening mammogram for malignant neoplasm of breast: Secondary | ICD-10-CM | POA: Diagnosis not present

## 2020-11-15 DIAGNOSIS — Z853 Personal history of malignant neoplasm of breast: Secondary | ICD-10-CM | POA: Diagnosis not present

## 2020-11-15 DIAGNOSIS — C787 Secondary malignant neoplasm of liver and intrahepatic bile duct: Secondary | ICD-10-CM | POA: Diagnosis not present

## 2020-11-16 DIAGNOSIS — C787 Secondary malignant neoplasm of liver and intrahepatic bile duct: Secondary | ICD-10-CM | POA: Diagnosis not present

## 2020-11-16 DIAGNOSIS — C259 Malignant neoplasm of pancreas, unspecified: Secondary | ICD-10-CM | POA: Diagnosis not present

## 2020-11-16 DIAGNOSIS — R188 Other ascites: Secondary | ICD-10-CM | POA: Diagnosis not present

## 2020-11-27 DIAGNOSIS — C259 Malignant neoplasm of pancreas, unspecified: Secondary | ICD-10-CM | POA: Diagnosis not present

## 2020-11-27 DIAGNOSIS — M7989 Other specified soft tissue disorders: Secondary | ICD-10-CM | POA: Diagnosis not present

## 2020-11-27 DIAGNOSIS — Z79899 Other long term (current) drug therapy: Secondary | ICD-10-CM | POA: Diagnosis not present

## 2020-11-27 DIAGNOSIS — Z5111 Encounter for antineoplastic chemotherapy: Secondary | ICD-10-CM | POA: Diagnosis not present

## 2020-11-27 DIAGNOSIS — C787 Secondary malignant neoplasm of liver and intrahepatic bile duct: Secondary | ICD-10-CM | POA: Diagnosis not present

## 2020-11-29 DIAGNOSIS — C787 Secondary malignant neoplasm of liver and intrahepatic bile duct: Secondary | ICD-10-CM | POA: Diagnosis not present

## 2020-11-29 DIAGNOSIS — C259 Malignant neoplasm of pancreas, unspecified: Secondary | ICD-10-CM | POA: Diagnosis not present

## 2020-12-11 DIAGNOSIS — C259 Malignant neoplasm of pancreas, unspecified: Secondary | ICD-10-CM | POA: Diagnosis not present

## 2020-12-11 DIAGNOSIS — Z5111 Encounter for antineoplastic chemotherapy: Secondary | ICD-10-CM | POA: Diagnosis not present

## 2020-12-11 DIAGNOSIS — D701 Agranulocytosis secondary to cancer chemotherapy: Secondary | ICD-10-CM | POA: Diagnosis not present

## 2020-12-11 DIAGNOSIS — E876 Hypokalemia: Secondary | ICD-10-CM | POA: Diagnosis not present

## 2020-12-11 DIAGNOSIS — T451X5A Adverse effect of antineoplastic and immunosuppressive drugs, initial encounter: Secondary | ICD-10-CM | POA: Diagnosis not present

## 2020-12-11 DIAGNOSIS — R7989 Other specified abnormal findings of blood chemistry: Secondary | ICD-10-CM | POA: Diagnosis not present

## 2020-12-11 DIAGNOSIS — C787 Secondary malignant neoplasm of liver and intrahepatic bile duct: Secondary | ICD-10-CM | POA: Diagnosis not present

## 2020-12-11 DIAGNOSIS — R600 Localized edema: Secondary | ICD-10-CM | POA: Diagnosis not present

## 2020-12-11 DIAGNOSIS — D509 Iron deficiency anemia, unspecified: Secondary | ICD-10-CM | POA: Diagnosis not present

## 2020-12-11 DIAGNOSIS — R112 Nausea with vomiting, unspecified: Secondary | ICD-10-CM | POA: Diagnosis not present

## 2020-12-11 DIAGNOSIS — K521 Toxic gastroenteritis and colitis: Secondary | ICD-10-CM | POA: Diagnosis not present

## 2020-12-11 DIAGNOSIS — R188 Other ascites: Secondary | ICD-10-CM | POA: Diagnosis not present

## 2020-12-13 DIAGNOSIS — C259 Malignant neoplasm of pancreas, unspecified: Secondary | ICD-10-CM | POA: Diagnosis not present

## 2020-12-13 DIAGNOSIS — Z5112 Encounter for antineoplastic immunotherapy: Secondary | ICD-10-CM | POA: Diagnosis not present

## 2020-12-13 DIAGNOSIS — C787 Secondary malignant neoplasm of liver and intrahepatic bile duct: Secondary | ICD-10-CM | POA: Diagnosis not present

## 2020-12-19 DIAGNOSIS — R911 Solitary pulmonary nodule: Secondary | ICD-10-CM | POA: Diagnosis not present

## 2020-12-19 DIAGNOSIS — J984 Other disorders of lung: Secondary | ICD-10-CM | POA: Diagnosis not present

## 2020-12-19 DIAGNOSIS — C787 Secondary malignant neoplasm of liver and intrahepatic bile duct: Secondary | ICD-10-CM | POA: Diagnosis not present

## 2020-12-19 DIAGNOSIS — C259 Malignant neoplasm of pancreas, unspecified: Secondary | ICD-10-CM | POA: Diagnosis not present

## 2020-12-19 DIAGNOSIS — K828 Other specified diseases of gallbladder: Secondary | ICD-10-CM | POA: Diagnosis not present

## 2020-12-19 DIAGNOSIS — K7460 Unspecified cirrhosis of liver: Secondary | ICD-10-CM | POA: Diagnosis not present

## 2020-12-21 DIAGNOSIS — R188 Other ascites: Secondary | ICD-10-CM | POA: Diagnosis not present

## 2020-12-21 DIAGNOSIS — C787 Secondary malignant neoplasm of liver and intrahepatic bile duct: Secondary | ICD-10-CM | POA: Diagnosis not present

## 2020-12-21 DIAGNOSIS — C259 Malignant neoplasm of pancreas, unspecified: Secondary | ICD-10-CM | POA: Diagnosis not present

## 2020-12-31 DIAGNOSIS — C259 Malignant neoplasm of pancreas, unspecified: Secondary | ICD-10-CM | POA: Diagnosis not present

## 2020-12-31 DIAGNOSIS — C787 Secondary malignant neoplasm of liver and intrahepatic bile duct: Secondary | ICD-10-CM | POA: Diagnosis not present

## 2020-12-31 DIAGNOSIS — R1084 Generalized abdominal pain: Secondary | ICD-10-CM | POA: Diagnosis not present

## 2020-12-31 DIAGNOSIS — M545 Low back pain, unspecified: Secondary | ICD-10-CM | POA: Diagnosis not present

## 2020-12-31 DIAGNOSIS — G893 Neoplasm related pain (acute) (chronic): Secondary | ICD-10-CM | POA: Diagnosis not present

## 2021-01-15 DIAGNOSIS — C259 Malignant neoplasm of pancreas, unspecified: Secondary | ICD-10-CM | POA: Diagnosis not present

## 2021-01-15 DIAGNOSIS — D509 Iron deficiency anemia, unspecified: Secondary | ICD-10-CM | POA: Diagnosis not present

## 2021-01-15 DIAGNOSIS — R188 Other ascites: Secondary | ICD-10-CM | POA: Diagnosis not present

## 2021-01-15 DIAGNOSIS — C787 Secondary malignant neoplasm of liver and intrahepatic bile duct: Secondary | ICD-10-CM | POA: Diagnosis not present

## 2021-01-16 DIAGNOSIS — D500 Iron deficiency anemia secondary to blood loss (chronic): Secondary | ICD-10-CM | POA: Diagnosis not present

## 2021-01-16 DIAGNOSIS — C787 Secondary malignant neoplasm of liver and intrahepatic bile duct: Secondary | ICD-10-CM | POA: Diagnosis not present

## 2021-01-16 DIAGNOSIS — C259 Malignant neoplasm of pancreas, unspecified: Secondary | ICD-10-CM | POA: Diagnosis not present

## 2021-01-16 DIAGNOSIS — R188 Other ascites: Secondary | ICD-10-CM | POA: Diagnosis not present

## 2021-01-23 DIAGNOSIS — Z5111 Encounter for antineoplastic chemotherapy: Secondary | ICD-10-CM | POA: Diagnosis not present

## 2021-01-23 DIAGNOSIS — R9389 Abnormal findings on diagnostic imaging of other specified body structures: Secondary | ICD-10-CM | POA: Diagnosis not present

## 2021-01-23 DIAGNOSIS — C259 Malignant neoplasm of pancreas, unspecified: Secondary | ICD-10-CM | POA: Diagnosis not present

## 2021-01-23 DIAGNOSIS — D500 Iron deficiency anemia secondary to blood loss (chronic): Secondary | ICD-10-CM | POA: Diagnosis not present

## 2021-01-23 DIAGNOSIS — I10 Essential (primary) hypertension: Secondary | ICD-10-CM | POA: Diagnosis not present

## 2021-01-23 DIAGNOSIS — C250 Malignant neoplasm of head of pancreas: Secondary | ICD-10-CM | POA: Diagnosis not present

## 2021-01-23 DIAGNOSIS — M545 Low back pain, unspecified: Secondary | ICD-10-CM | POA: Diagnosis not present

## 2021-01-23 DIAGNOSIS — C787 Secondary malignant neoplasm of liver and intrahepatic bile duct: Secondary | ICD-10-CM | POA: Diagnosis not present

## 2021-01-23 DIAGNOSIS — R188 Other ascites: Secondary | ICD-10-CM | POA: Diagnosis not present

## 2021-01-23 DIAGNOSIS — M8580 Other specified disorders of bone density and structure, unspecified site: Secondary | ICD-10-CM | POA: Diagnosis not present

## 2021-01-25 DIAGNOSIS — C787 Secondary malignant neoplasm of liver and intrahepatic bile duct: Secondary | ICD-10-CM | POA: Diagnosis not present

## 2021-01-25 DIAGNOSIS — C259 Malignant neoplasm of pancreas, unspecified: Secondary | ICD-10-CM | POA: Diagnosis not present

## 2021-01-28 DIAGNOSIS — R188 Other ascites: Secondary | ICD-10-CM | POA: Diagnosis not present

## 2021-01-30 DIAGNOSIS — D500 Iron deficiency anemia secondary to blood loss (chronic): Secondary | ICD-10-CM | POA: Diagnosis not present

## 2021-02-06 DIAGNOSIS — C250 Malignant neoplasm of head of pancreas: Secondary | ICD-10-CM | POA: Diagnosis not present

## 2021-02-06 DIAGNOSIS — C259 Malignant neoplasm of pancreas, unspecified: Secondary | ICD-10-CM | POA: Diagnosis not present

## 2021-02-06 DIAGNOSIS — R188 Other ascites: Secondary | ICD-10-CM | POA: Diagnosis not present

## 2021-02-06 DIAGNOSIS — Z170 Estrogen receptor positive status [ER+]: Secondary | ICD-10-CM | POA: Diagnosis not present

## 2021-02-06 DIAGNOSIS — D509 Iron deficiency anemia, unspecified: Secondary | ICD-10-CM | POA: Diagnosis not present

## 2021-02-06 DIAGNOSIS — C50112 Malignant neoplasm of central portion of left female breast: Secondary | ICD-10-CM | POA: Diagnosis not present

## 2021-02-06 DIAGNOSIS — D701 Agranulocytosis secondary to cancer chemotherapy: Secondary | ICD-10-CM | POA: Diagnosis not present

## 2021-02-06 DIAGNOSIS — C787 Secondary malignant neoplasm of liver and intrahepatic bile duct: Secondary | ICD-10-CM | POA: Diagnosis not present

## 2021-02-06 DIAGNOSIS — R7989 Other specified abnormal findings of blood chemistry: Secondary | ICD-10-CM | POA: Diagnosis not present

## 2021-02-06 DIAGNOSIS — R600 Localized edema: Secondary | ICD-10-CM | POA: Diagnosis not present

## 2021-02-06 DIAGNOSIS — K521 Toxic gastroenteritis and colitis: Secondary | ICD-10-CM | POA: Diagnosis not present

## 2021-02-06 DIAGNOSIS — D6481 Anemia due to antineoplastic chemotherapy: Secondary | ICD-10-CM | POA: Diagnosis not present

## 2021-02-11 DIAGNOSIS — C787 Secondary malignant neoplasm of liver and intrahepatic bile duct: Secondary | ICD-10-CM | POA: Diagnosis not present

## 2021-02-11 DIAGNOSIS — R188 Other ascites: Secondary | ICD-10-CM | POA: Diagnosis not present

## 2021-02-11 DIAGNOSIS — C259 Malignant neoplasm of pancreas, unspecified: Secondary | ICD-10-CM | POA: Diagnosis not present

## 2021-02-13 DIAGNOSIS — C787 Secondary malignant neoplasm of liver and intrahepatic bile duct: Secondary | ICD-10-CM | POA: Diagnosis not present

## 2021-02-13 DIAGNOSIS — R600 Localized edema: Secondary | ICD-10-CM | POA: Diagnosis not present

## 2021-02-13 DIAGNOSIS — D6481 Anemia due to antineoplastic chemotherapy: Secondary | ICD-10-CM | POA: Diagnosis not present

## 2021-02-13 DIAGNOSIS — D701 Agranulocytosis secondary to cancer chemotherapy: Secondary | ICD-10-CM | POA: Diagnosis not present

## 2021-02-13 DIAGNOSIS — C259 Malignant neoplasm of pancreas, unspecified: Secondary | ICD-10-CM | POA: Diagnosis not present

## 2021-02-13 DIAGNOSIS — R7989 Other specified abnormal findings of blood chemistry: Secondary | ICD-10-CM | POA: Diagnosis not present

## 2021-02-13 DIAGNOSIS — C250 Malignant neoplasm of head of pancreas: Secondary | ICD-10-CM | POA: Diagnosis not present

## 2021-02-13 DIAGNOSIS — R634 Abnormal weight loss: Secondary | ICD-10-CM | POA: Diagnosis not present

## 2021-02-13 DIAGNOSIS — D500 Iron deficiency anemia secondary to blood loss (chronic): Secondary | ICD-10-CM | POA: Diagnosis not present

## 2021-02-13 DIAGNOSIS — R188 Other ascites: Secondary | ICD-10-CM | POA: Diagnosis not present

## 2021-02-16 DIAGNOSIS — N179 Acute kidney failure, unspecified: Secondary | ICD-10-CM | POA: Diagnosis not present

## 2021-02-16 DIAGNOSIS — K6389 Other specified diseases of intestine: Secondary | ICD-10-CM | POA: Diagnosis not present

## 2021-02-16 DIAGNOSIS — T85590A Other mechanical complication of bile duct prosthesis, initial encounter: Secondary | ICD-10-CM | POA: Diagnosis not present

## 2021-02-16 DIAGNOSIS — R627 Adult failure to thrive: Secondary | ICD-10-CM | POA: Diagnosis not present

## 2021-02-16 DIAGNOSIS — I824Z2 Acute embolism and thrombosis of unspecified deep veins of left distal lower extremity: Secondary | ICD-10-CM | POA: Diagnosis not present

## 2021-02-16 DIAGNOSIS — R188 Other ascites: Secondary | ICD-10-CM | POA: Diagnosis not present

## 2021-02-16 DIAGNOSIS — R180 Malignant ascites: Secondary | ICD-10-CM | POA: Diagnosis not present

## 2021-02-16 DIAGNOSIS — F339 Major depressive disorder, recurrent, unspecified: Secondary | ICD-10-CM | POA: Diagnosis not present

## 2021-02-16 DIAGNOSIS — D72829 Elevated white blood cell count, unspecified: Secondary | ICD-10-CM | POA: Diagnosis not present

## 2021-02-16 DIAGNOSIS — C250 Malignant neoplasm of head of pancreas: Secondary | ICD-10-CM | POA: Diagnosis not present

## 2021-02-16 DIAGNOSIS — C259 Malignant neoplasm of pancreas, unspecified: Secondary | ICD-10-CM | POA: Diagnosis not present

## 2021-02-16 DIAGNOSIS — N3000 Acute cystitis without hematuria: Secondary | ICD-10-CM | POA: Diagnosis not present

## 2021-02-16 DIAGNOSIS — I2510 Atherosclerotic heart disease of native coronary artery without angina pectoris: Secondary | ICD-10-CM | POA: Diagnosis not present

## 2021-02-16 DIAGNOSIS — K831 Obstruction of bile duct: Secondary | ICD-10-CM | POA: Diagnosis not present

## 2021-02-16 DIAGNOSIS — C7800 Secondary malignant neoplasm of unspecified lung: Secondary | ICD-10-CM | POA: Diagnosis not present

## 2021-02-16 DIAGNOSIS — E871 Hypo-osmolality and hyponatremia: Secondary | ICD-10-CM | POA: Diagnosis not present

## 2021-02-16 DIAGNOSIS — R531 Weakness: Secondary | ICD-10-CM | POA: Diagnosis not present

## 2021-02-16 DIAGNOSIS — K8689 Other specified diseases of pancreas: Secondary | ICD-10-CM | POA: Diagnosis not present

## 2021-02-16 DIAGNOSIS — M6281 Muscle weakness (generalized): Secondary | ICD-10-CM | POA: Diagnosis not present

## 2021-02-16 DIAGNOSIS — K8309 Other cholangitis: Secondary | ICD-10-CM | POA: Diagnosis not present

## 2021-02-17 DIAGNOSIS — R188 Other ascites: Secondary | ICD-10-CM | POA: Diagnosis not present

## 2021-02-17 DIAGNOSIS — N179 Acute kidney failure, unspecified: Secondary | ICD-10-CM | POA: Diagnosis not present

## 2021-02-17 DIAGNOSIS — Z515 Encounter for palliative care: Secondary | ICD-10-CM | POA: Diagnosis not present

## 2021-02-17 DIAGNOSIS — M545 Low back pain, unspecified: Secondary | ICD-10-CM | POA: Diagnosis not present

## 2021-02-17 DIAGNOSIS — I824Z2 Acute embolism and thrombosis of unspecified deep veins of left distal lower extremity: Secondary | ICD-10-CM | POA: Diagnosis not present

## 2021-02-17 DIAGNOSIS — G893 Neoplasm related pain (acute) (chronic): Secondary | ICD-10-CM | POA: Diagnosis not present

## 2021-02-17 DIAGNOSIS — C787 Secondary malignant neoplasm of liver and intrahepatic bile duct: Secondary | ICD-10-CM | POA: Diagnosis not present

## 2021-02-17 DIAGNOSIS — R627 Adult failure to thrive: Secondary | ICD-10-CM | POA: Diagnosis not present

## 2021-02-17 DIAGNOSIS — C259 Malignant neoplasm of pancreas, unspecified: Secondary | ICD-10-CM | POA: Diagnosis not present

## 2021-02-17 DIAGNOSIS — C799 Secondary malignant neoplasm of unspecified site: Secondary | ICD-10-CM | POA: Diagnosis not present

## 2021-02-17 DIAGNOSIS — K8309 Other cholangitis: Secondary | ICD-10-CM | POA: Diagnosis not present

## 2021-02-17 DIAGNOSIS — Z7189 Other specified counseling: Secondary | ICD-10-CM | POA: Diagnosis not present

## 2021-02-17 DIAGNOSIS — D72829 Elevated white blood cell count, unspecified: Secondary | ICD-10-CM | POA: Diagnosis not present

## 2021-02-17 DIAGNOSIS — R180 Malignant ascites: Secondary | ICD-10-CM | POA: Diagnosis not present

## 2021-02-17 DIAGNOSIS — E860 Dehydration: Secondary | ICD-10-CM | POA: Diagnosis not present

## 2021-02-17 DIAGNOSIS — R531 Weakness: Secondary | ICD-10-CM | POA: Diagnosis not present

## 2021-02-18 DIAGNOSIS — J4540 Moderate persistent asthma, uncomplicated: Secondary | ICD-10-CM | POA: Diagnosis not present

## 2021-02-18 DIAGNOSIS — N179 Acute kidney failure, unspecified: Secondary | ICD-10-CM | POA: Diagnosis not present

## 2021-02-18 DIAGNOSIS — K8681 Exocrine pancreatic insufficiency: Secondary | ICD-10-CM | POA: Diagnosis not present

## 2021-02-18 DIAGNOSIS — C7889 Secondary malignant neoplasm of other digestive organs: Secondary | ICD-10-CM | POA: Diagnosis not present

## 2021-02-18 DIAGNOSIS — K831 Obstruction of bile duct: Secondary | ICD-10-CM | POA: Diagnosis not present

## 2021-02-18 DIAGNOSIS — T85590A Other mechanical complication of bile duct prosthesis, initial encounter: Secondary | ICD-10-CM | POA: Diagnosis not present

## 2021-02-18 DIAGNOSIS — R188 Other ascites: Secondary | ICD-10-CM | POA: Diagnosis not present

## 2021-02-18 DIAGNOSIS — N3000 Acute cystitis without hematuria: Secondary | ICD-10-CM | POA: Diagnosis not present

## 2021-02-18 DIAGNOSIS — C787 Secondary malignant neoplasm of liver and intrahepatic bile duct: Secondary | ICD-10-CM | POA: Diagnosis not present

## 2021-02-18 DIAGNOSIS — I10 Essential (primary) hypertension: Secondary | ICD-10-CM | POA: Diagnosis not present

## 2021-02-18 DIAGNOSIS — R180 Malignant ascites: Secondary | ICD-10-CM | POA: Diagnosis not present

## 2021-02-18 DIAGNOSIS — K8309 Other cholangitis: Secondary | ICD-10-CM | POA: Diagnosis not present

## 2021-02-18 DIAGNOSIS — Z515 Encounter for palliative care: Secondary | ICD-10-CM | POA: Diagnosis not present

## 2021-02-18 DIAGNOSIS — J45909 Unspecified asthma, uncomplicated: Secondary | ICD-10-CM | POA: Diagnosis not present

## 2021-02-18 DIAGNOSIS — R7989 Other specified abnormal findings of blood chemistry: Secondary | ICD-10-CM | POA: Diagnosis not present

## 2021-02-18 DIAGNOSIS — R531 Weakness: Secondary | ICD-10-CM | POA: Diagnosis not present

## 2021-02-18 DIAGNOSIS — C7800 Secondary malignant neoplasm of unspecified lung: Secondary | ICD-10-CM | POA: Diagnosis not present

## 2021-02-18 DIAGNOSIS — Z01818 Encounter for other preprocedural examination: Secondary | ICD-10-CM | POA: Diagnosis not present

## 2021-02-18 DIAGNOSIS — K449 Diaphragmatic hernia without obstruction or gangrene: Secondary | ICD-10-CM | POA: Diagnosis not present

## 2021-02-18 DIAGNOSIS — R627 Adult failure to thrive: Secondary | ICD-10-CM | POA: Diagnosis not present

## 2021-02-18 DIAGNOSIS — C250 Malignant neoplasm of head of pancreas: Secondary | ICD-10-CM | POA: Diagnosis not present

## 2021-02-18 DIAGNOSIS — F339 Major depressive disorder, recurrent, unspecified: Secondary | ICD-10-CM | POA: Diagnosis not present

## 2021-02-18 DIAGNOSIS — Z9689 Presence of other specified functional implants: Secondary | ICD-10-CM | POA: Diagnosis not present

## 2021-02-18 DIAGNOSIS — E860 Dehydration: Secondary | ICD-10-CM | POA: Diagnosis not present

## 2021-02-18 DIAGNOSIS — Z7189 Other specified counseling: Secondary | ICD-10-CM | POA: Diagnosis not present

## 2021-02-18 DIAGNOSIS — C259 Malignant neoplasm of pancreas, unspecified: Secondary | ICD-10-CM | POA: Diagnosis not present

## 2021-02-19 DIAGNOSIS — R180 Malignant ascites: Secondary | ICD-10-CM | POA: Diagnosis not present

## 2021-02-19 DIAGNOSIS — K449 Diaphragmatic hernia without obstruction or gangrene: Secondary | ICD-10-CM | POA: Diagnosis not present

## 2021-02-19 DIAGNOSIS — Z9689 Presence of other specified functional implants: Secondary | ICD-10-CM | POA: Diagnosis not present

## 2021-02-19 DIAGNOSIS — I10 Essential (primary) hypertension: Secondary | ICD-10-CM | POA: Diagnosis not present

## 2021-02-19 DIAGNOSIS — N3000 Acute cystitis without hematuria: Secondary | ICD-10-CM | POA: Diagnosis not present

## 2021-02-19 DIAGNOSIS — J45909 Unspecified asthma, uncomplicated: Secondary | ICD-10-CM | POA: Diagnosis not present

## 2021-02-19 DIAGNOSIS — J4540 Moderate persistent asthma, uncomplicated: Secondary | ICD-10-CM | POA: Diagnosis not present

## 2021-02-19 DIAGNOSIS — C259 Malignant neoplasm of pancreas, unspecified: Secondary | ICD-10-CM | POA: Diagnosis not present

## 2021-02-19 DIAGNOSIS — N179 Acute kidney failure, unspecified: Secondary | ICD-10-CM | POA: Diagnosis not present

## 2021-02-19 DIAGNOSIS — K831 Obstruction of bile duct: Secondary | ICD-10-CM | POA: Diagnosis not present

## 2021-02-19 DIAGNOSIS — R627 Adult failure to thrive: Secondary | ICD-10-CM | POA: Diagnosis not present

## 2021-02-19 DIAGNOSIS — R531 Weakness: Secondary | ICD-10-CM | POA: Diagnosis not present

## 2021-02-19 DIAGNOSIS — Z01818 Encounter for other preprocedural examination: Secondary | ICD-10-CM | POA: Diagnosis not present

## 2021-02-19 DIAGNOSIS — C787 Secondary malignant neoplasm of liver and intrahepatic bile duct: Secondary | ICD-10-CM | POA: Diagnosis not present

## 2021-02-19 DIAGNOSIS — R7989 Other specified abnormal findings of blood chemistry: Secondary | ICD-10-CM | POA: Diagnosis not present

## 2021-02-19 DIAGNOSIS — C7889 Secondary malignant neoplasm of other digestive organs: Secondary | ICD-10-CM | POA: Diagnosis not present

## 2021-02-19 DIAGNOSIS — K8681 Exocrine pancreatic insufficiency: Secondary | ICD-10-CM | POA: Diagnosis not present

## 2021-02-20 DIAGNOSIS — R627 Adult failure to thrive: Secondary | ICD-10-CM | POA: Diagnosis not present

## 2021-02-20 DIAGNOSIS — Z7189 Other specified counseling: Secondary | ICD-10-CM | POA: Diagnosis not present

## 2021-02-20 DIAGNOSIS — R7989 Other specified abnormal findings of blood chemistry: Secondary | ICD-10-CM | POA: Diagnosis not present

## 2021-02-20 DIAGNOSIS — R180 Malignant ascites: Secondary | ICD-10-CM | POA: Diagnosis not present

## 2021-02-20 DIAGNOSIS — C259 Malignant neoplasm of pancreas, unspecified: Secondary | ICD-10-CM | POA: Diagnosis not present

## 2021-02-20 DIAGNOSIS — K831 Obstruction of bile duct: Secondary | ICD-10-CM | POA: Diagnosis not present

## 2021-02-20 DIAGNOSIS — C787 Secondary malignant neoplasm of liver and intrahepatic bile duct: Secondary | ICD-10-CM | POA: Diagnosis not present

## 2021-02-20 DIAGNOSIS — N3000 Acute cystitis without hematuria: Secondary | ICD-10-CM | POA: Diagnosis not present

## 2021-02-20 DIAGNOSIS — R531 Weakness: Secondary | ICD-10-CM | POA: Diagnosis not present

## 2021-02-20 DIAGNOSIS — Z515 Encounter for palliative care: Secondary | ICD-10-CM | POA: Diagnosis not present

## 2021-02-21 DIAGNOSIS — K831 Obstruction of bile duct: Secondary | ICD-10-CM | POA: Diagnosis not present

## 2021-02-21 DIAGNOSIS — C787 Secondary malignant neoplasm of liver and intrahepatic bile duct: Secondary | ICD-10-CM | POA: Diagnosis not present

## 2021-02-21 DIAGNOSIS — R627 Adult failure to thrive: Secondary | ICD-10-CM | POA: Diagnosis not present

## 2021-02-21 DIAGNOSIS — C7800 Secondary malignant neoplasm of unspecified lung: Secondary | ICD-10-CM | POA: Diagnosis not present

## 2021-02-21 DIAGNOSIS — R180 Malignant ascites: Secondary | ICD-10-CM | POA: Diagnosis not present

## 2021-02-21 DIAGNOSIS — N179 Acute kidney failure, unspecified: Secondary | ICD-10-CM | POA: Diagnosis not present

## 2021-02-21 DIAGNOSIS — K8309 Other cholangitis: Secondary | ICD-10-CM | POA: Diagnosis not present

## 2021-02-21 DIAGNOSIS — N3000 Acute cystitis without hematuria: Secondary | ICD-10-CM | POA: Diagnosis not present

## 2021-02-21 DIAGNOSIS — C250 Malignant neoplasm of head of pancreas: Secondary | ICD-10-CM | POA: Diagnosis not present

## 2021-02-21 DIAGNOSIS — C259 Malignant neoplasm of pancreas, unspecified: Secondary | ICD-10-CM | POA: Diagnosis not present

## 2021-02-21 DIAGNOSIS — T85590A Other mechanical complication of bile duct prosthesis, initial encounter: Secondary | ICD-10-CM | POA: Diagnosis not present

## 2021-02-21 DIAGNOSIS — F339 Major depressive disorder, recurrent, unspecified: Secondary | ICD-10-CM | POA: Diagnosis not present

## 2021-02-21 DIAGNOSIS — R531 Weakness: Secondary | ICD-10-CM | POA: Diagnosis not present

## 2021-03-19 DEATH — deceased

## 2022-10-02 IMAGING — CT CT ABD-PELV W/ CM
2 of 5 series · 15 of 46 positions shown, 17 images · IV contrast (APPLIED)
Comparison: None.

CLINICAL DATA: Abdominal pain, bloating, and nausea for 3 weeks.
Diarrhea. Jaundice.

EXAM:
CT ABDOMEN AND PELVIS WITH CONTRAST
TECHNIQUE: Multidetector CT imaging of the abdomen and pelvis was performed
using the standard protocol following bolus administration of
intravenous contrast.
CONTRAST:  100mL OMNIPAQUE IOHEXOL 300 MG/ML  SOLN

[Series 2: axial st · axial · 0.92mm/px · z∈[-429,-44]mm · 12 of 87 slices shown, 14 images]
[im 5/87  soft-tissue]
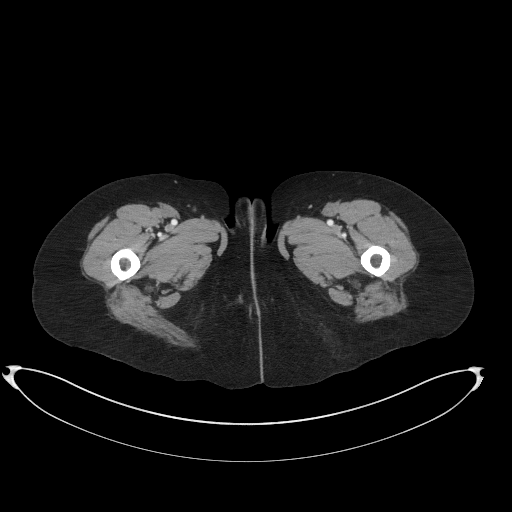
[im 5/87  bone]
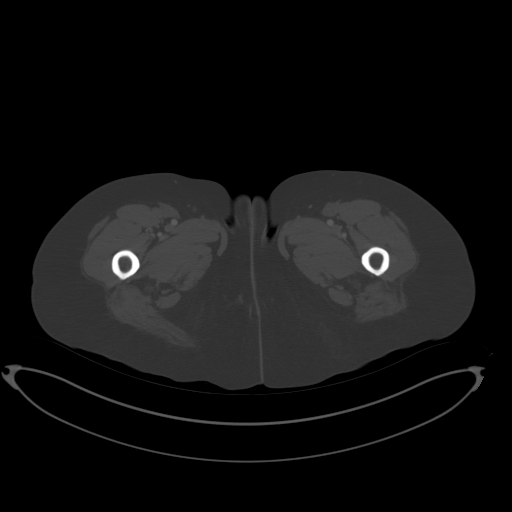
[im 14/87  soft-tissue]
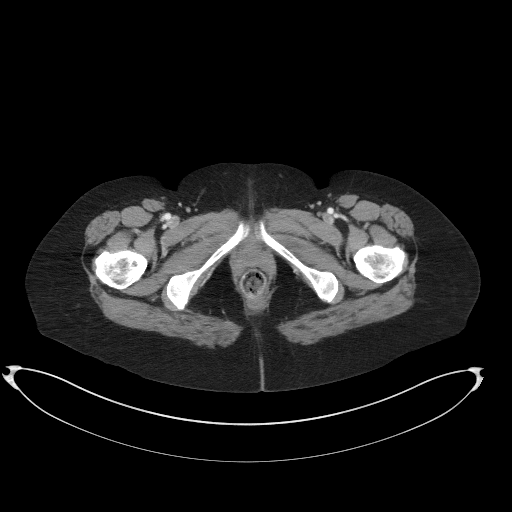
[im 19/87  soft-tissue]
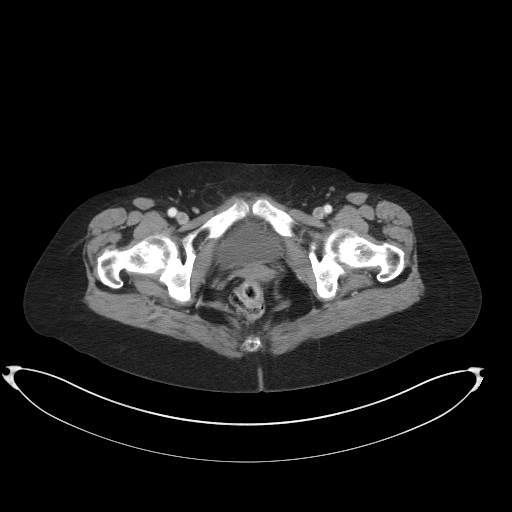
[im 28/87  soft-tissue]
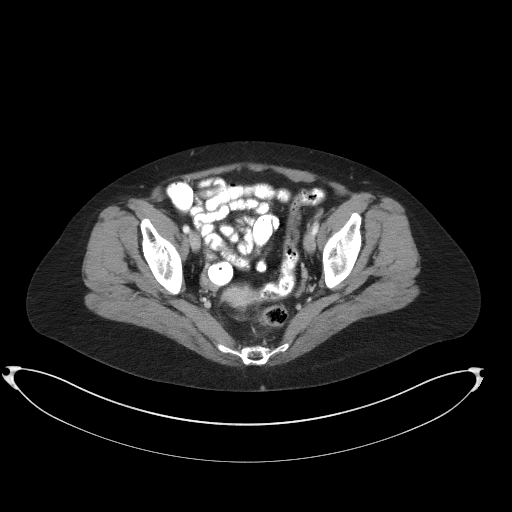
[im 32/87  soft-tissue]
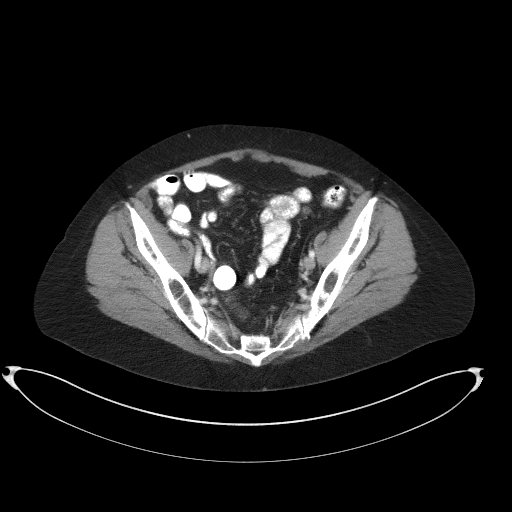
[im 41/87  soft-tissue]
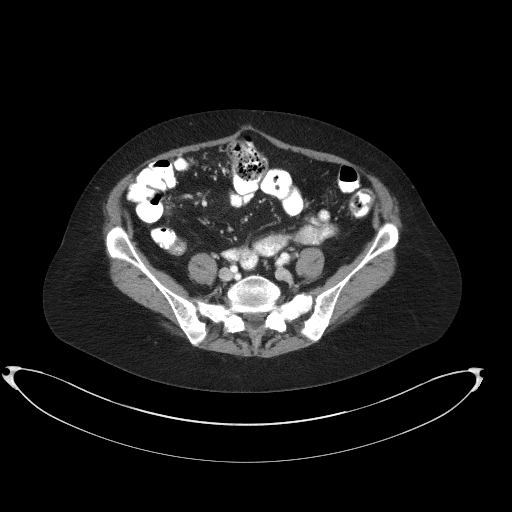
[im 46/87  soft-tissue]
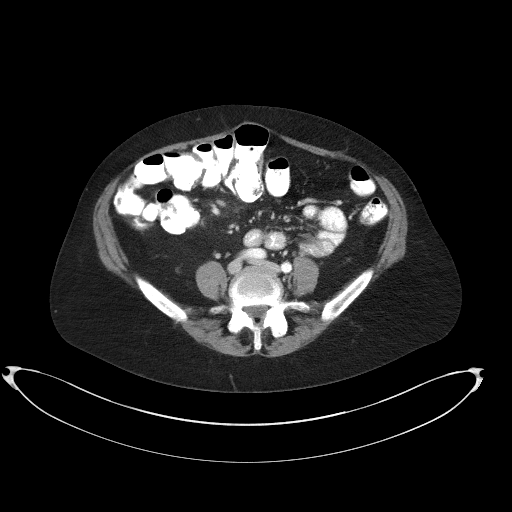
[im 55/87  soft-tissue]
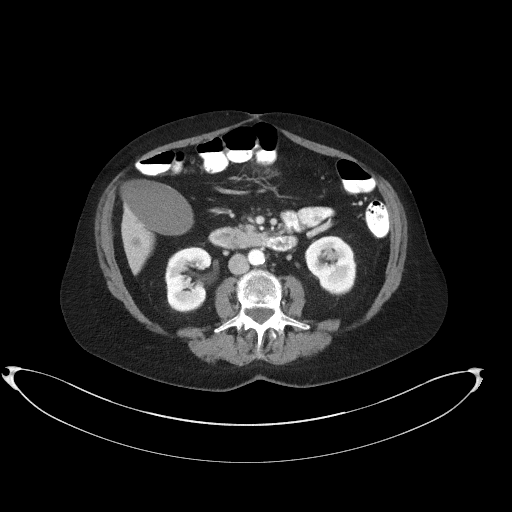
[im 59/87  soft-tissue]
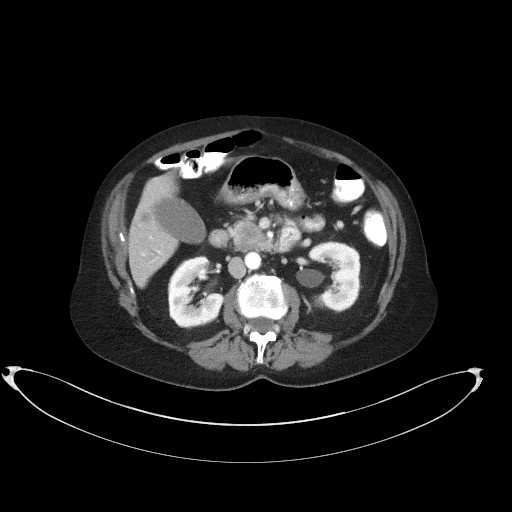
[im 59/87  bone]
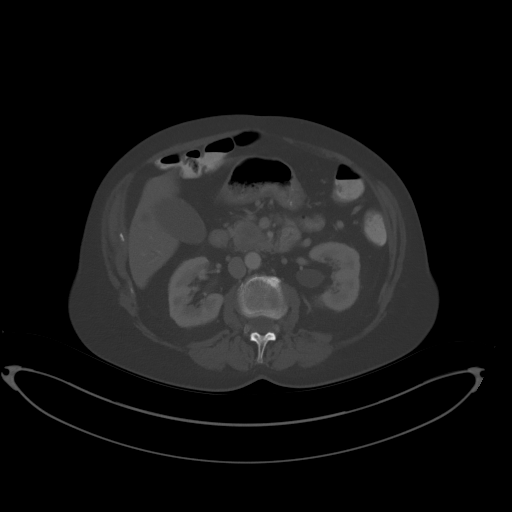
[im 68/87  soft-tissue]
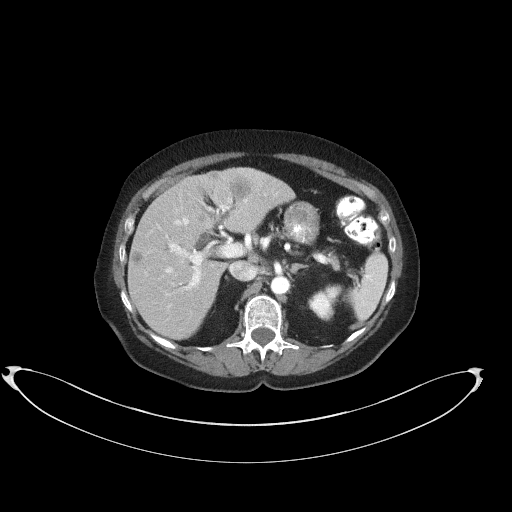
[im 73/87  soft-tissue]
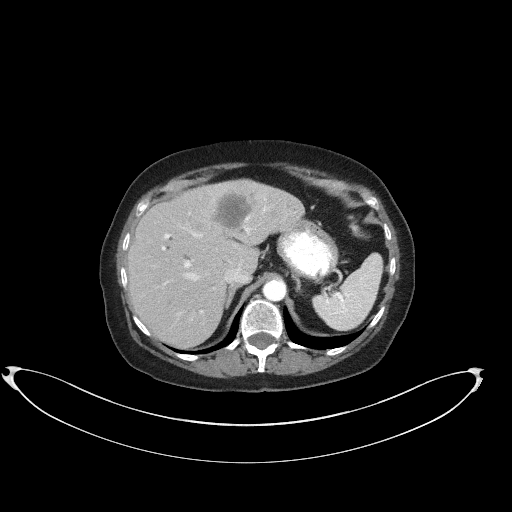
[im 82/87  soft-tissue]
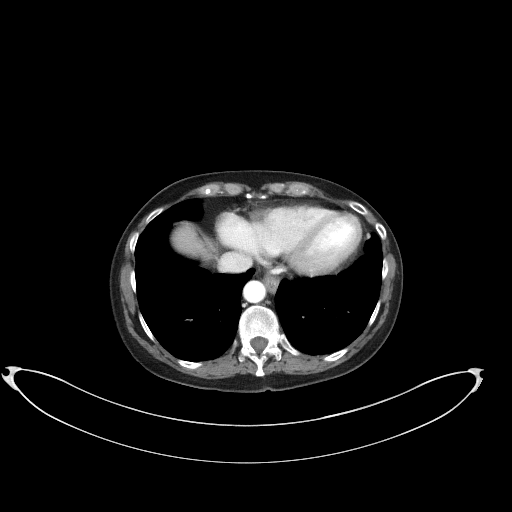

[Series 5: coronal st · coronal · 0.75mm/px · 3 of 93 slices shown]
[im 31/93  soft-tissue]
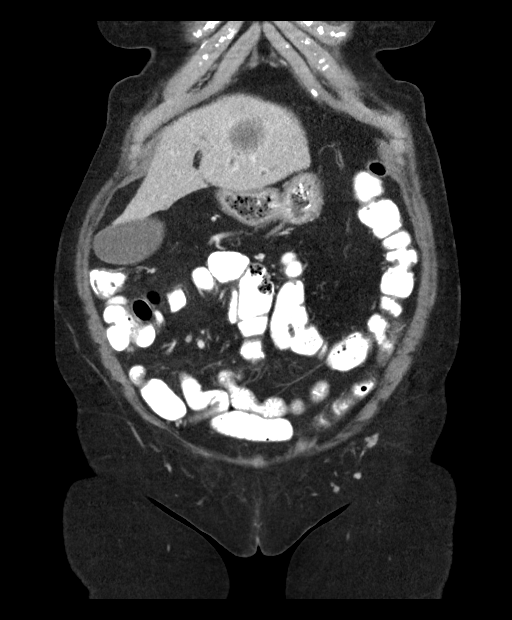
[im 41/93  soft-tissue]
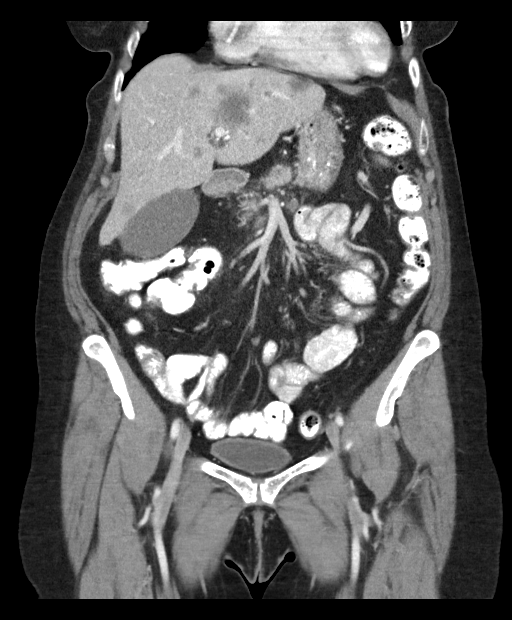
[im 52/93  soft-tissue]
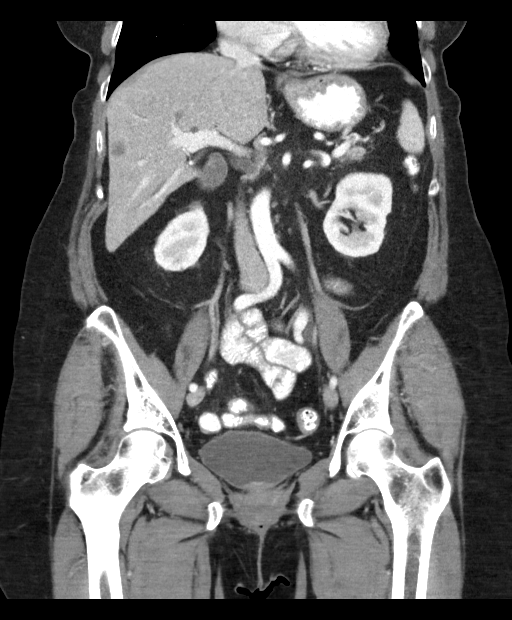

[15 of 46 positions shown; findings below may reference images not displayed]

FINDINGS: Lower Chest: No acute findings.

Hepatobiliary: Multiple hypovascular masses are seen throughout the
right and left lobes, consistent with liver metastases. Largest
index lesion in the lateral segment of the left hepatic lobe
measures 3.5 x 3.4 cm on image [DATE]. The gallbladder is distended,
and diffuse biliary ductal dilatation is seen to the level of
pancreatic head. No evidence of acute cholecystitis.

Pancreas: And low-attenuation mass is seen in the pancreatic head
which measures 2.8 x 2.4 cm. This mass encases and narrows the
superior mesenteric vein near the portal venous confluence, but does
not involve the superior mesenteric artery. Pancreatic ductal
dilatation is noted.

Spleen: Within normal limits in size and appearance.

Adrenals/Urinary Tract: No masses identified. No evidence of
ureteral calculi or hydronephrosis.

Stomach/Bowel: No evidence of obstruction, inflammatory process or
abnormal fluid collections.

Vascular/Lymphatic: 8 mm peripancreatic lymph node is seen along the
anterior aspect of the uncinate process on image [DATE]. No
pathologically enlarged lymph nodes are identified. No abdominal
aortic aneurysm.

Reproductive: Several small calcified uterine fibroids are seen,
largest measuring 1.9 cm. Diffuse endometrial thickening is noted
which is abnormal in a postmenopausal female, and suspicious for
endometrial carcinoma. Adnexal regions are unremarkable.

Other:  None.

Musculoskeletal:  No suspicious bone lesions identified.
IMPRESSION: 2.8 cm mass in the pancreatic head, consistent with primary
pancreatic carcinoma. This mass encases and narrows the superior
mesenteric vein, but does not involve the superior mesenteric
artery.

Diffuse liver metastases.

8 mm peripancreatic lymph node. Lymph node metastases cannot be
excluded.

Diffuse endometrial thickening, suspicious for endometrial carcinoma
in a postmenopausal female. Recommend pelvic ultrasound for further
evaluation.

Small calcified uterine fibroids.

These results will be called to the ordering clinician or
representative by the Radiologist Assistant, and communication
documented in the PACS or [REDACTED].
